# Patient Record
Sex: Female | Born: 1955
Health system: Southern US, Community
[De-identification: ages and names within clinical notes are randomized; demographics above are authoritative.]

## PROBLEM LIST (undated history)

## (undated) DIAGNOSIS — G4733 Obstructive sleep apnea (adult) (pediatric): Secondary | ICD-10-CM

## (undated) DIAGNOSIS — H269 Unspecified cataract: Secondary | ICD-10-CM

## (undated) DIAGNOSIS — K219 Gastro-esophageal reflux disease without esophagitis: Secondary | ICD-10-CM

## (undated) DIAGNOSIS — F32A Depression, unspecified: Secondary | ICD-10-CM

## (undated) DIAGNOSIS — Z780 Asymptomatic menopausal state: Secondary | ICD-10-CM

## (undated) DIAGNOSIS — F329 Major depressive disorder, single episode, unspecified: Secondary | ICD-10-CM

## (undated) DIAGNOSIS — Z8619 Personal history of other infectious and parasitic diseases: Secondary | ICD-10-CM

## (undated) DIAGNOSIS — E7800 Pure hypercholesterolemia, unspecified: Secondary | ICD-10-CM

## (undated) DIAGNOSIS — E871 Hypo-osmolality and hyponatremia: Secondary | ICD-10-CM

## (undated) DIAGNOSIS — G43909 Migraine, unspecified, not intractable, without status migrainosus: Secondary | ICD-10-CM

## (undated) DIAGNOSIS — F3181 Bipolar II disorder: Secondary | ICD-10-CM

## (undated) DIAGNOSIS — G510 Bell's palsy: Secondary | ICD-10-CM

## (undated) DIAGNOSIS — G4730 Sleep apnea, unspecified: Secondary | ICD-10-CM

## (undated) DIAGNOSIS — K559 Vascular disorder of intestine, unspecified: Secondary | ICD-10-CM

## (undated) DIAGNOSIS — Z9989 Dependence on other enabling machines and devices: Principal | ICD-10-CM

## (undated) DIAGNOSIS — K5190 Ulcerative colitis, unspecified, without complications: Secondary | ICD-10-CM

## (undated) DIAGNOSIS — M1990 Unspecified osteoarthritis, unspecified site: Secondary | ICD-10-CM

## (undated) DIAGNOSIS — F419 Anxiety disorder, unspecified: Secondary | ICD-10-CM

## (undated) DIAGNOSIS — H9192 Unspecified hearing loss, left ear: Secondary | ICD-10-CM

## (undated) DIAGNOSIS — E039 Hypothyroidism, unspecified: Secondary | ICD-10-CM

## (undated) DIAGNOSIS — D649 Anemia, unspecified: Principal | ICD-10-CM

## (undated) DIAGNOSIS — T7840XA Allergy, unspecified, initial encounter: Secondary | ICD-10-CM

## (undated) HISTORY — DX: Pure hypercholesterolemia, unspecified: E78.00

## (undated) HISTORY — DX: Sleep apnea, unspecified: G47.30

## (undated) HISTORY — DX: Bell's palsy: G51.0

## (undated) HISTORY — DX: Personal history of other infectious and parasitic diseases: Z86.19

## (undated) HISTORY — DX: Major depressive disorder, single episode, unspecified: F32.9

## (undated) HISTORY — PX: NASAL SEPTUM SURGERY: SHX37

## (undated) HISTORY — DX: Depression, unspecified: F32.A

## (undated) HISTORY — DX: Unspecified cataract: H26.9

## (undated) HISTORY — DX: Migraine, unspecified, not intractable, without status migrainosus: G43.909

## (undated) HISTORY — DX: Ulcerative colitis, unspecified, without complications: K51.90

## (undated) HISTORY — DX: Gastro-esophageal reflux disease without esophagitis: K21.9

## (undated) HISTORY — DX: Obstructive sleep apnea (adult) (pediatric): G47.33

## (undated) HISTORY — DX: Vascular disorder of intestine, unspecified: K55.9

## (undated) HISTORY — DX: Unspecified hearing loss, left ear: H91.92

## (undated) HISTORY — DX: Hypothyroidism, unspecified: E03.9

## (undated) HISTORY — DX: Anxiety disorder, unspecified: F41.9

## (undated) HISTORY — DX: Asymptomatic menopausal state: Z78.0

## (undated) HISTORY — DX: Dependence on other enabling machines and devices: Z99.89

## (undated) HISTORY — DX: Allergy, unspecified, initial encounter: T78.40XA

## (undated) HISTORY — DX: Hypo-osmolality and hyponatremia: E87.1

## (undated) HISTORY — DX: Bipolar II disorder: F31.81

## (undated) HISTORY — DX: Unspecified osteoarthritis, unspecified site: M19.90

## (undated) HISTORY — PX: EYE SURGERY: SHX253

## (undated) HISTORY — DX: Anemia, unspecified: D64.9

---

## 1998-11-17 ENCOUNTER — Emergency Department (HOSPITAL_COMMUNITY): Admission: EM | Admit: 1998-11-17 | Discharge: 1998-11-17 | Payer: Self-pay | Admitting: Emergency Medicine

## 1999-03-02 ENCOUNTER — Encounter: Admission: RE | Admit: 1999-03-02 | Discharge: 1999-03-02 | Payer: Self-pay | Admitting: Obstetrics and Gynecology

## 1999-03-03 ENCOUNTER — Encounter: Payer: Self-pay | Admitting: Obstetrics and Gynecology

## 1999-05-20 ENCOUNTER — Other Ambulatory Visit: Admission: RE | Admit: 1999-05-20 | Discharge: 1999-05-20 | Payer: Self-pay | Admitting: Obstetrics and Gynecology

## 2000-04-11 ENCOUNTER — Encounter: Payer: Self-pay | Admitting: Otolaryngology

## 2000-04-11 ENCOUNTER — Encounter: Admission: RE | Admit: 2000-04-11 | Discharge: 2000-04-11 | Payer: Self-pay | Admitting: Otolaryngology

## 2000-06-09 ENCOUNTER — Other Ambulatory Visit: Admission: RE | Admit: 2000-06-09 | Discharge: 2000-06-09 | Payer: Self-pay | Admitting: *Deleted

## 2001-06-06 ENCOUNTER — Other Ambulatory Visit: Admission: RE | Admit: 2001-06-06 | Discharge: 2001-06-06 | Payer: Self-pay | Admitting: Obstetrics and Gynecology

## 2001-06-13 ENCOUNTER — Encounter: Admission: RE | Admit: 2001-06-13 | Discharge: 2001-06-13 | Payer: Self-pay | Admitting: Obstetrics and Gynecology

## 2001-06-13 ENCOUNTER — Encounter: Payer: Self-pay | Admitting: Obstetrics and Gynecology

## 2002-07-31 ENCOUNTER — Other Ambulatory Visit: Admission: RE | Admit: 2002-07-31 | Discharge: 2002-07-31 | Payer: Self-pay | Admitting: Obstetrics and Gynecology

## 2003-05-06 ENCOUNTER — Encounter: Admission: RE | Admit: 2003-05-06 | Discharge: 2003-05-06 | Payer: Self-pay | Admitting: Obstetrics and Gynecology

## 2003-09-19 ENCOUNTER — Other Ambulatory Visit: Admission: RE | Admit: 2003-09-19 | Discharge: 2003-09-19 | Payer: Self-pay | Admitting: Obstetrics and Gynecology

## 2003-10-17 ENCOUNTER — Ambulatory Visit (HOSPITAL_COMMUNITY): Admission: RE | Admit: 2003-10-17 | Discharge: 2003-10-17 | Payer: Self-pay | Admitting: Gastroenterology

## 2003-10-17 ENCOUNTER — Encounter (INDEPENDENT_AMBULATORY_CARE_PROVIDER_SITE_OTHER): Payer: Self-pay | Admitting: Specialist

## 2004-03-11 ENCOUNTER — Encounter: Admission: RE | Admit: 2004-03-11 | Discharge: 2004-03-11 | Payer: Self-pay | Admitting: Family Medicine

## 2004-09-25 ENCOUNTER — Ambulatory Visit: Payer: Self-pay | Admitting: Gastroenterology

## 2004-09-25 ENCOUNTER — Inpatient Hospital Stay (HOSPITAL_COMMUNITY): Admission: EM | Admit: 2004-09-25 | Discharge: 2004-09-26 | Payer: Self-pay | Admitting: Emergency Medicine

## 2004-09-28 ENCOUNTER — Encounter: Admission: RE | Admit: 2004-09-28 | Discharge: 2004-09-28 | Payer: Self-pay | Admitting: Gastroenterology

## 2004-10-01 ENCOUNTER — Ambulatory Visit (HOSPITAL_COMMUNITY): Admission: RE | Admit: 2004-10-01 | Discharge: 2004-10-01 | Payer: Self-pay | Admitting: Gastroenterology

## 2004-10-01 ENCOUNTER — Encounter (INDEPENDENT_AMBULATORY_CARE_PROVIDER_SITE_OTHER): Payer: Self-pay | Admitting: Specialist

## 2004-10-13 ENCOUNTER — Other Ambulatory Visit: Admission: RE | Admit: 2004-10-13 | Discharge: 2004-10-13 | Payer: Self-pay | Admitting: Obstetrics and Gynecology

## 2004-11-08 ENCOUNTER — Encounter: Admission: RE | Admit: 2004-11-08 | Discharge: 2004-11-08 | Payer: Self-pay | Admitting: Obstetrics and Gynecology

## 2005-07-06 ENCOUNTER — Encounter (INDEPENDENT_AMBULATORY_CARE_PROVIDER_SITE_OTHER): Payer: Self-pay | Admitting: *Deleted

## 2005-07-06 ENCOUNTER — Ambulatory Visit (HOSPITAL_COMMUNITY): Admission: RE | Admit: 2005-07-06 | Discharge: 2005-07-06 | Payer: Self-pay | Admitting: Gastroenterology

## 2005-07-25 ENCOUNTER — Ambulatory Visit (HOSPITAL_COMMUNITY): Admission: RE | Admit: 2005-07-25 | Discharge: 2005-07-25 | Payer: Self-pay | Admitting: Gastroenterology

## 2005-11-10 ENCOUNTER — Encounter: Admission: RE | Admit: 2005-11-10 | Discharge: 2005-11-10 | Payer: Self-pay | Admitting: Obstetrics and Gynecology

## 2006-01-16 ENCOUNTER — Other Ambulatory Visit: Admission: RE | Admit: 2006-01-16 | Discharge: 2006-01-16 | Payer: Self-pay | Admitting: Obstetrics and Gynecology

## 2006-11-13 ENCOUNTER — Encounter: Admission: RE | Admit: 2006-11-13 | Discharge: 2006-11-13 | Payer: Self-pay | Admitting: Obstetrics and Gynecology

## 2007-01-19 ENCOUNTER — Other Ambulatory Visit: Admission: RE | Admit: 2007-01-19 | Discharge: 2007-01-19 | Payer: Self-pay | Admitting: Obstetrics and Gynecology

## 2007-11-26 ENCOUNTER — Encounter: Admission: RE | Admit: 2007-11-26 | Discharge: 2007-11-26 | Payer: Self-pay | Admitting: Obstetrics and Gynecology

## 2008-02-01 DIAGNOSIS — K559 Vascular disorder of intestine, unspecified: Secondary | ICD-10-CM

## 2008-02-01 HISTORY — DX: Vascular disorder of intestine, unspecified: K55.9

## 2008-02-08 ENCOUNTER — Other Ambulatory Visit: Admission: RE | Admit: 2008-02-08 | Discharge: 2008-02-08 | Payer: Self-pay | Admitting: Obstetrics and Gynecology

## 2008-11-26 ENCOUNTER — Encounter: Admission: RE | Admit: 2008-11-26 | Discharge: 2008-11-26 | Payer: Self-pay | Admitting: Obstetrics and Gynecology

## 2009-02-09 ENCOUNTER — Encounter: Admission: RE | Admit: 2009-02-09 | Discharge: 2009-02-09 | Payer: Self-pay | Admitting: Gastroenterology

## 2009-02-16 LAB — HM PAP SMEAR: HM Pap smear: NEGATIVE

## 2009-12-15 ENCOUNTER — Encounter: Admission: RE | Admit: 2009-12-15 | Discharge: 2009-12-15 | Payer: Self-pay | Admitting: Obstetrics and Gynecology

## 2010-02-21 ENCOUNTER — Encounter: Payer: Self-pay | Admitting: Gastroenterology

## 2010-06-18 NOTE — Discharge Summary (Signed)
NAMESHAINNA, FAUX NO.:  [id number]   MEDICAL RECORD NO.:  [id number]          PATIENT TYPE:  INP   LOCATION:  2033                         FACILITY:  [facility]   PHYSICIAN:  Jerelene Redden, MD      DATE OF BIRTH:  Apr 20, 1955   DATE OF ADMISSION:  09/24/2004  DATE OF DISCHARGE:  09/26/2004                                 DISCHARGE SUMMARY   Jessica Berger is a 55 year old lady who had last had a colonoscopy in  September 2005 by Dr. Collene Mares. I think the indication for this procedure was  that the patient's father had died as a result of colon cancer. This study  showed evidence of a left colon AVM and a hyperplastic polyp. The patient  presented to [facility] on September 25, 2004 with acute onset of  lower abdominal pain associated with several episodes of hematochezia. A CT  scan of the abdomen showed segmental thickening of the descending colon with  associated stranding. In light of these findings, the patient was admitted  to the hospital by Dr. Sheila Oats.   Her physical exam was remarkable for the abdominal exam being benign. There  were normal bowel sounds. There was no guarding or rebound. I mentioned the  patient was seen in consultation by Dr. Fuller Plan of the gastroenterology  service. Dr. Fuller Plan reviewed the patient's x-ray studies and felt that the  patient did in fact have evidence of descending colon colitis, probably an  ischemic process. On admission, the patient was initially made n.p.o. and  was placed empirically on Cipro and Flagyl before on a dose of 400  milligrams IV every 12 hours, the latter 500 mg IV every 8 hours. Dilaudid  was administered as needed for pain.   The next day, the patient was advanced to a clear liquid diet and tolerated  this well. During the course of hospitalization, she had no further problems  with gastrointestinal bleeding or abdominal pain. On day 27, the patient was  seen again by Dr. Fuller Plan. Dr. Fuller Plan  apparently reviewed the patient's CT scan  again and noted that there was a 7 mL hypodense area within the body of the  pancreas thought to be a pancreatic duct. This finding could possibly  indicate the presence of a neoplasm. Dr. Fuller Plan made the following  recommendations:  1) that the patient was to really balance her diet at home  following a lactose-free, caffeine-free, low-fat diet; 2) that the patient  could be discharged at this time and follow up with Dr. Collene Mares in one to two  weeks; 3) that the findings on the CT scan of the abdomen may be discussed  at the weekly GI conference and recommendations would be made whether to  proceed with endoscopic ultrasound or MRCP. Dr. Fuller Plan deferred this ultimate  decision to Dr. Collene Mares. Therefore, on September 26, 2004 at Dr. Lynne Leader  recommendation, the patient was discharged.   DISCHARGE DIAGNOSES:  1.  Abdominal pain and hematochezia secondary to colonic inflammation,      possibly on an ischemic basis.  2.  Hypokalemia.  3.  Abnormal lesion in the body of the pancreas. Subsequent workup to be      planned as described above.  4.  History of depression.  5.  History of carpal tunnel syndrome.  6.  History of restless leg syndrome.   DISCHARGE MEDICATIONS:  The patient is advised to continue all of her usual  medications. These consist of Wellbutrin 300 milligrams daily, Zoloft 200  milligrams daily, Requip 1 milligrams daily, calcium 1 gram daily, vitamin E  400 milligrams daily.   Antibiotics were discontinued and she was advised as noted above to follow a  lactose-free, caffeine-free, low-fat diet. Also advised above, she was told  to return to see Dr. Collene Mares in one to two weeks.           ______________________________  Jerelene Redden, MD     SY/MEDQ  D:  09/26/2004  T:  09/26/2004  Job:  [id number]   cc:   Frann Rider, M.D.  [place]  [place] [address]  Fax: [phone]   Nelwyn Salisbury, M.D.  [address]  [address]  [place] [address]  Fax: [phone]   Pricilla Riffle. Fuller Plan, M.D. [facility]

## 2010-06-18 NOTE — Op Note (Signed)
NAME:  SAMIHA, DENAPOLI               ACCOUNT NO.:  [id number]   MEDICAL RECORD NO.:  [id number]          PATIENT TYPE:  AMB   LOCATION:  ENDO                         FACILITY:  [facility]   PHYSICIAN:  Nelwyn Salisbury, M.D.  DATE OF BIRTH:  04-Apr-1955   DATE OF PROCEDURE:  10/01/2004  DATE OF DISCHARGE:                                 OPERATIVE REPORT   PROCEDURE PERFORMED:  Colonoscopy with multiple core biopsies.   ENDOSCOPIST:  Nelwyn Salisbury, M.D.   INSTRUMENT USED:  Olympus videocolonoscope.   INDICATIONS FOR PROCEDURE:  A 55 year old white female with family history  of colon cancer in her father undergoing repeat colonoscopy for significant  rectal bleeding requiring hospitalization in the recent past, and CT scan  revealing ischemic colitis.  Colonoscopy is being performed to define the  source of bleeding.   PRE PROCEDURE PREPARATION:  Informed consent was procured from the patient.  The patient was fasted for eight hours prior to procedure and prepped with  Osmoprep pills prior to the procedure (the night before and the morning of  the procedure).  Risks and benefits of the procedure including a 10% risk  rate of cancer and polyps were discussed with the patient.   PREPROCEDURE PHYSICAL:  VITAL SIGNS:  The patient has stable vital signs.  NECK:  Supple.  CHEST:  Clear to auscultation.  CARDIAC:  S1, S2 regular.  ABDOMEN:  Soft with normal bowel sounds.   DESCRIPTION OF PROCEDURE:  The patient was placed in the left lateral  decubitus position and sedated with 70 mg of Demerol and 7.5 mg of Versed in  slow incremental doses.  Once the patient was adequately sedated and  maintained on low-flow oxygen and continuous cardiac monitoring, the Olympus  videocolonoscope was advanced from the rectum to the cecum.  The  appendicular orifice and ileocecal valve were clearly visualized and  photographed.  Three small sessile polyps were biopsied from the  rectosigmoid colon, and  whitish exudates were noted in the rectum which were  biopsied for pathology as well to rule out C. difficile infection.  Retroflexion in the rectum revealed no abnormalities.  The patient tolerated  the procedure well without complication.  There was some loss of vascular  pattern in the left colon, but no definite ulceration or ischemia was noted.   IMPRESSION:  1.  Whitish exudates in rectum.  Question C. difficile colitis.  2.  Three small sessile polyps biopsied from rectosigmoid colon.  3.  Some loss of vascular pattern in left colon.  Question recent ischemic      insult.  4.  No ulcers or masses seen.  5.  Normal appearing transverse colon, right colon, and cecum.   RECOMMENDATIONS:  1.  Await pathology results.  2.  Avoid nonsteroidals including aspirin for now.  3.  Repeat colonoscopy depending on pathology result.  4.  Outpatient followup as need arises in the future.      Nelwyn Salisbury, M.D.  Electronically Signed     JNM/MEDQ  D:  10/01/2004  T:  10/01/2004  Job:  [id number]  cc:   Frann Rider, M.D.  [place]  [place] [address]  Fax: [place]. Romine, M.D.  [address] [place]. [place]  [place] [address]  Fax: [phone]

## 2010-06-18 NOTE — Op Note (Signed)
NAME:  Jessica Berger, Jessica Berger                         ACCOUNT NO.:  [id number]   MEDICAL RECORD NO.:  [id number]                   PATIENT TYPE:  AMB   LOCATION:  ENDO                                 FACILITY:  [facility]   PHYSICIAN:  Nelwyn Salisbury, M.D.               DATE OF BIRTH:  January 08, 1956   DATE OF PROCEDURE:  DATE OF DISCHARGE:                                 OPERATIVE REPORT   PROCEDURE PERFORMED:  Colonoscopy with snare, polypectomy x 1.   ENDOSCOPIST:  Nelwyn Salisbury, M.D.   INSTRUMENT USED:  Olympus video colonoscope.   INDICATIONS FOR THE PROCEDURE:  A 55 year old white female undergoing a  screening colonoscopy.  The patient has a family history of colon cancer in  the father and a paternal grandfather and has had recent rectal bleeding  with a change in bowel habits, rule out colonic polyps, masses, etc.   PREPROCEDURE PREPARATION:  Informed consent was secured from the patient.  The patient fasted for eight hours prior to the procedure and prepped with a  bottle of magnesium citrate and gallon of GoLYTELY the night prior to the  procedure.   PREPROCEDURE PHYSICAL:  The patient had stable vital signs.  Neck supple.  Chest is clear to auscultation.  S1, S2 regular.  Abdomen soft with normal  bowel sounds.   DESCRIPTION OF PROCEDURE:  The patient was placed in the left lateral  decubitus position, sedated with 75 mg of Demerol and 7.5 mg of Versed in  slow incremental doses.  Once the patient was adequately sedated and  maintained on low flow oxygen and continuous cardiac monitoring, the Olympus  video colonoscope was advanced from the rectum to the cecum.  A small AVM  was noted in the mid left colon.  This was not ablated as it was not  bleeding.  Small internal hemorrhoids were appreciated on retroflexion.  In  the rectum a flat polyp was snared from 30 cm.  The rest of the exam was  normal.  The appendiceal orifice and ileocecal valve were clearly visualized  and  photographed.   IMPRESSION:  1.  Small nonbleeding internal hemorrhoids.  2.  Flat polyp snared from 30 cm.  3.  Arteriovenous malformation in the mid left colon, not ablated.  4.  Normal appearing transverse colon, right colon and cecum.   RECOMMENDATIONS:  1.  Await pathology results.  2.  Continue a high fiber diet with fluid intake.  3.  Avoid nonsteroidals for now.  4.  Outpatient follow up in the next two weeks for further recommendations.      JNM/MEDQ  D:  10/18/2003  T:  10/20/2003  Job:  [id number]   cc:   Frann Rider, M.D.  [place]  [place] [address]  Fax: [phone]   Lubertha South. Romine, M.D.  [address], [address] [place]  [place] [address]  Fax:  [phone] 

## 2010-06-18 NOTE — H&P (Signed)
NAME:  Jessica Berger, Jessica Berger               ACCOUNT NO.:  [id number]   MEDICAL RECORD NO.:  [id number]          PATIENT TYPE:  EMS   LOCATION:  MAJO                         FACILITY:  [facility]   PHYSICIAN:  Sheila Oats, M.D.DATE OF BIRTH:  04-06-1955   DATE OF ADMISSION:  09/24/2004  DATE OF DISCHARGE:                                HISTORY & PHYSICAL   CHIEF COMPLAINT:  Abdominal pain and rectal bleeding.   PRIMARY CARE PHYSICIAN:  Dr. Darcus Austin.   HISTORY OF PRESENT ILLNESS:  The patient is a 55 year old white female with  history of depression and restless leg syndrome who presents with complaints  of abdominal pain and rectal bleeding times one day. She describes the pain  as a right-sided, severe, 10/10 in intensity at its peak, sharp and constant  times one day. She reports blood per rectum times five today as well. She  admits to nausea and vomiting times a few days. She denies chest pain,  hematemesis, dysuria, or melena. The patient also denies any recent  antibiotic use and denies any NSAID use.  It was noted that the patient last  saw Dr. Collene Mares in September 2005 and had a screening colonoscopy done at that  time which showed AV malformation and internal hemorrhoids as well as a  polyp.   The patient was seen in the emergency room and a CT scan of the abdomen  revealed a 15-cm length of thick wall descending colon and pericolonic  shunting with no abscess and no perforation. His H&H were stable at 12.1 and  36.3, and blood pressure of 108/51. In the ER the patient was also given IV  narcotics for pain relief and at that time she was seen she described the  pain as a dull ache, that it was much improved from when she came in. She is  admitted to the [facility] hospital service for further evaluation and  management.   PAST MEDICAL HISTORY:  As stated above.   MEDICATIONS:  1.  Zoloft 200 mg p.o. daily.  2.  Wellbutrin 300 mg p.o. daily.  3.  Requip.   ALLERGIES:  PENICILLIN  and SULFA.   SOCIAL HISTORY:  She quit tobacco 13 to 14 years ago.  Occasional alcohol.   FAMILY HISTORY:  Her father is deceased, diagnosed with colon cancer at age  79. Grandfather also had colon cancer. Her mother and father had  hypertension.   REVIEW OF SYSTEMS:  As per HPI.  Other review of systems negative.   PHYSICAL EXAMINATION:  GENERAL: The patient is a middle-age white female, in  no apparent distress.  VITAL SIGNS: Temperature 97.6, blood pressure 108/51, initially 145/87.  Pulse is 66, respiratory rate 20.  HEENT: PERRL. EOMI. Sclera anicteric. Slightly dry mucous membranes. No oral  exudates.  NECK: Supple. No adenopathy. No thyromegaly. No JVD.  LUNGS: Clear to auscultation bilaterally. No crackles or wheezes.  ABDOMEN: Soft, bowel sounds present, mild diffuse tenderness, no rebound  tenderness, no masses palpable, and no organomegaly.  EXTREMITIES: No cyanosis or edema.  NEUROLOGIC: Alert and oriented times three. Cranial nerves II-XII  grossly  intact. Nonfocal exam.   LABORATORY DATA:  CT scan of the abdomen--a 15-cm length of thick wall  descending colon with pericolonic stranding. No abscess or perforation.   Her white blood cell count is 6.3, hemoglobin 12.1, hematocrit 36.3,  platelet count 254,000, and her urinalysis is unremarkable. Her sodium is  138 with a potassium of 3.4, chloride 106, CO2 26, glucose 96, BUN 9,  creatinine 1, calcium 9.3, total protein 6.4, albumin 4.1, AST 14, ALT 12,  alkaline phosphatase 72, total bilirubin 0.5.   ASSESSMENT/PLAN:  1.  Rectal bleeding/colitis--the patient is a 55 year old white female      presenting with abdominal pain and rectal bleeding. CT scan with      thickening and pericolonic stranding of the descending colon--no abscess      and no perforation. H&H as above and patient hemodynamically stable.      The patient had a colonoscopy in 2006 and the findings are as stated      above, per Dr. Collene Mares. I will start  the patient on empiric antibiotics--      Cipro and Flagyl. Will keep her NPO for bowel rest and hydrate with IV      fluids. Will monitor H&H, type and screen. Consult gastroenterology--Dr.      Collene Mares.  2.  Hypokalemia, mild--replace potassium.  3.  History of depression--follow and resume Zoloft and Wellbutrin when      taking p.o.  4.  History of restless leg syndrome. Follow and resume Requip when taking      p.o.           ______________________________  Sheila Oats, M.D.     ACV/MEDQ  D:  09/25/2004  T:  09/25/2004  Job:  [id number]   cc:   Nelwyn Salisbury, M.D.  [address]  [address] [place] [address]  Fax: [phone]   Frann Rider, M.D.  [place]  [place] [address]  Fax: [phone]

## 2010-06-18 NOTE — Op Note (Signed)
NAME:  Jessica Berger, Jessica Berger               ACCOUNT NO.:  [id number]   MEDICAL RECORD NO.:  [id number]          PATIENT TYPE:  AMB   LOCATION:  ENDO                         FACILITY:  [facility]   PHYSICIAN:  Nelwyn Salisbury, M.D.  DATE OF BIRTH:  [birth year]   DATE OF PROCEDURE:  07/06/2005  DATE OF DISCHARGE:                                 OPERATIVE REPORT   PROCEDURE PERFORMED:  Esophagogastroduodenoscopy with multiple gastric and  small bowel biopsies.   ENDOSCOPIST:  Nelwyn Salisbury, M.D.   INSTRUMENT USED:  Olympus video panendoscope.   INDICATIONS FOR PROCEDURE:  The patient is a 55 year old white female with a  history of iron deficiency anemia and ferritinous stool, undergoing  esophagogastroduodenoscopy to rule out peptic ulcer disease, esophagitis,  gastritis, AVMs, etc.   PREPROCEDURE PREPARATION:  Informed consent was procured from the patient.  The patient was fasted for four hours prior to the procedure.  The risks and  benefits of the procedure were discussed with her in great detail.   PREPROCEDURE PHYSICAL:  The patient had stable vital signs.  Neck supple,  chest clear to auscultation.  S1, S2 regular.  Abdomen soft with normal  bowel sounds.   DESCRIPTION OF PROCEDURE:  The patient was placed in the left lateral  decubitus position and sedated with 25 mcg of fentanyl and 5 mg of Versed in  slow incremental doses.  Once the patient was adequately sedated and  maintained on low-flow oxygen and continuous cardiac monitoring, the Olympus  video panendoscope was advanced through the mouth piece over the tongue into  the esophagus under direct vision.  The entire esophagus was widely patent  with no evidence of ring, stricture, masses, esophagitis or Barrett's  mucosa.  There was a hiatal hernia seen on high retroflexion.  Antral  gastritis was noted.  Biopsies were done to rule out presence of  Helicobacter pylori by pathology.  No ulcers, masses or polyps were seen.  On  further advancing the scope to the stomach, the proximal small bowel  appeared normal.  Small bowel biopsies were done to rule out sprue.  The  patient tolerated the procedure well without immediate complications.   IMPRESSION:  1.  Normal-appearing esophagus.  2.  Large hiatal hernia seen on retroflexion.  3.  Antral gastritis biopsy done to rule out Helicobacter pylori.  4.  Normal proximal small bowel biopsies done to rule out sprue.   RECOMMENDATIONS:  1.  Await pathology results.  2.  Avoid all nonsteroidals including aspirin for now.  3.  Outpatient followup in the next two weeks for further recommendations      and repeat CBC.      Nelwyn Salisbury, M.D.  Electronically Signed     JNM/MEDQ  D:  07/06/2005  T:  07/07/2005  Job:  [id number]

## 2010-11-11 ENCOUNTER — Other Ambulatory Visit: Payer: Self-pay | Admitting: Obstetrics and Gynecology

## 2010-11-11 DIAGNOSIS — Z1231 Encounter for screening mammogram for malignant neoplasm of breast: Secondary | ICD-10-CM

## 2010-12-21 ENCOUNTER — Ambulatory Visit
Admission: RE | Admit: 2010-12-21 | Discharge: 2010-12-21 | Disposition: A | Discharge status: Home / Self Care | Payer: BC Managed Care – PPO | Source: Ambulatory Visit | Attending: Obstetrics and Gynecology | Admitting: Obstetrics and Gynecology

## 2010-12-21 DIAGNOSIS — Z1231 Encounter for screening mammogram for malignant neoplasm of breast: Secondary | ICD-10-CM

## 2010-12-22 ENCOUNTER — Other Ambulatory Visit: Payer: Self-pay | Admitting: Obstetrics and Gynecology

## 2010-12-22 DIAGNOSIS — R928 Other abnormal and inconclusive findings on diagnostic imaging of breast: Secondary | ICD-10-CM

## 2011-01-14 ENCOUNTER — Ambulatory Visit
Admission: RE | Admit: 2011-01-14 | Discharge: 2011-01-14 | Disposition: A | Discharge status: Home / Self Care | Payer: BC Managed Care – PPO | Source: Ambulatory Visit | Attending: Obstetrics and Gynecology | Admitting: Obstetrics and Gynecology

## 2011-01-14 DIAGNOSIS — N6320 Unspecified lump in the left breast, unspecified quadrant: Secondary | ICD-10-CM

## 2011-01-14 DIAGNOSIS — R928 Other abnormal and inconclusive findings on diagnostic imaging of breast: Secondary | ICD-10-CM

## 2011-12-28 ENCOUNTER — Other Ambulatory Visit: Payer: Self-pay | Admitting: Obstetrics and Gynecology

## 2011-12-28 DIAGNOSIS — Z1231 Encounter for screening mammogram for malignant neoplasm of breast: Secondary | ICD-10-CM

## 2012-01-12 LAB — HM COLONOSCOPY

## 2012-02-01 DIAGNOSIS — E039 Hypothyroidism, unspecified: Secondary | ICD-10-CM

## 2012-02-01 DIAGNOSIS — K5190 Ulcerative colitis, unspecified, without complications: Secondary | ICD-10-CM

## 2012-02-01 HISTORY — DX: Hypothyroidism, unspecified: E03.9

## 2012-02-01 HISTORY — DX: Ulcerative colitis, unspecified, without complications: K51.90

## 2012-02-09 ENCOUNTER — Ambulatory Visit: Payer: BC Managed Care – PPO

## 2012-02-09 ENCOUNTER — Ambulatory Visit
Admission: RE | Admit: 2012-02-09 | Discharge: 2012-02-09 | Disposition: A | Discharge status: Home / Self Care | Payer: BC Managed Care – PPO | Source: Ambulatory Visit | Attending: Obstetrics and Gynecology | Admitting: Obstetrics and Gynecology

## 2012-02-09 DIAGNOSIS — Z1231 Encounter for screening mammogram for malignant neoplasm of breast: Secondary | ICD-10-CM

## 2012-02-09 LAB — HM MAMMOGRAPHY: HM Mammogram: NEGATIVE

## 2012-04-04 ENCOUNTER — Telehealth: Payer: Self-pay | Admitting: Oncology

## 2012-04-04 NOTE — Telephone Encounter (Signed)
S/W PT IN REF TO NP APPT. ON 04/18/12@1 :30 REFERRING DR MANN DX-IRON DEF.ANEMIA MAILED NP PACKET

## 2012-04-04 NOTE — Telephone Encounter (Signed)
C/D 04/04/12 for appt. 04/18/12

## 2012-04-17 ENCOUNTER — Other Ambulatory Visit: Payer: Self-pay | Admitting: Oncology

## 2012-04-17 DIAGNOSIS — D649 Anemia, unspecified: Secondary | ICD-10-CM

## 2012-04-18 ENCOUNTER — Other Ambulatory Visit (HOSPITAL_BASED_OUTPATIENT_CLINIC_OR_DEPARTMENT_OTHER): Payer: BC Managed Care – PPO | Admitting: Lab

## 2012-04-18 ENCOUNTER — Encounter: Payer: Self-pay | Admitting: Oncology

## 2012-04-18 ENCOUNTER — Telehealth: Payer: Self-pay | Admitting: Oncology

## 2012-04-18 ENCOUNTER — Ambulatory Visit (HOSPITAL_BASED_OUTPATIENT_CLINIC_OR_DEPARTMENT_OTHER): Payer: BC Managed Care – PPO | Admitting: Oncology

## 2012-04-18 ENCOUNTER — Ambulatory Visit: Payer: BC Managed Care – PPO

## 2012-04-18 VITALS — BP 121/74 | HR 81 | Temp 98.7°F | Resp 20 | Ht 67.5 in | Wt 210.1 lb

## 2012-04-18 DIAGNOSIS — D509 Iron deficiency anemia, unspecified: Secondary | ICD-10-CM

## 2012-04-18 DIAGNOSIS — D649 Anemia, unspecified: Secondary | ICD-10-CM

## 2012-04-18 HISTORY — DX: Anemia, unspecified: D64.9

## 2012-04-18 LAB — CBC WITH DIFFERENTIAL/PLATELET
BASO%: 0.9 % (ref 0.0–2.0)
LYMPH%: 19.9 % (ref 14.0–49.7)
MCH: 25.8 pg (ref 25.1–34.0)
MCHC: 32.6 g/dL (ref 31.5–36.0)
MCV: 79.1 fL — ABNORMAL LOW (ref 79.5–101.0)
MONO%: 12.3 % (ref 0.0–14.0)
Platelets: 265 10*3/uL (ref 145–400)
RBC: 4.85 10*6/uL (ref 3.70–5.45)

## 2012-04-18 LAB — COMPREHENSIVE METABOLIC PANEL (CC13)
ALT: 12 U/L (ref 0–55)
Alkaline Phosphatase: 78 U/L (ref 40–150)
Glucose: 93 mg/dl (ref 70–99)
Sodium: 138 mEq/L (ref 136–145)
Total Bilirubin: 0.38 mg/dL (ref 0.20–1.20)
Total Protein: 6.8 g/dL (ref 6.4–8.3)

## 2012-04-18 LAB — IRON AND TIBC
%SAT: 11 % — ABNORMAL LOW (ref 20–55)
Iron: 41 ug/dL — ABNORMAL LOW (ref 42–145)

## 2012-04-18 NOTE — Progress Notes (Signed)
Note dictated

## 2012-04-18 NOTE — Progress Notes (Signed)
Checked in new pt with no financial concerns. °

## 2012-04-18 NOTE — Telephone Encounter (Signed)
gve the pt her march appt for the iv infusion and June 2014 appt calendar

## 2012-04-18 NOTE — Progress Notes (Signed)
CC:   Frann Rider, M.D. Nelwyn Salisbury, MD, [facility]  REASON FOR CONSULTATION:  Iron deficiency.  HISTORY OF PRESENT ILLNESS:  Jessica Berger is a 57 year old woman currently of [place], originally from [place], [place].  She is currently living in [place] as mentioned and has done so since the late 1990s.  She is reasonably in good health.  She does have history of a depression as well as a history of colitis and gets routine followup from a gastrointestinal standpoint by Dr. Collene Mares.  Her last colonoscopy was done in December 2013.  She was noted on most recent laboratory data to have some microcytosis with an MCV of 77.5, lower limit of normal was 78.  Her hemoglobin was normal at 12.3.  She had normal chemistry.  Iron studies showed iron of 61 and her ferritin was 7, lower limit of normal was 10.  She has had problems with fatigue, tiredness, but did not report any hematochezia.  Did not report any melena.  Did not report any GYN bleeding.  She had not reported any chest pain, had not had any difficulty breathing.  Still performing most activities of daily living without any hindrance or decline.  She has tried an oral iron supplementation but she experienced severe diarrhea and was sent to me for evaluation for possible IV iron.  REVIEW OF SYSTEMS:  Did not report any headaches, blurry vision, double vision.  Did not report any motor or sensory neuropathy.  Did not report any alteration in mental status.  Did not report any psychiatric issues, depression.  Did not report any fever, chills, sweats.  Did not report any cough, hemoptysis, hematemesis.  No nausea, vomiting.  Did not report any abdominal pain.  No hematochezia.  No melena.  No genitourinary complaints.  Rest of review of systems unremarkable.  PAST MEDICAL HISTORY:  Significant for ulcerative colitis.  She has history of seasonal allergy, depression, migraines, hemorrhoids, hypothyroidism.  MEDICATION:  She  is on Synthroid, Zoloft, Prometrium, Nexium, Latuda, Lamictal and Klonopin.  ALLERGIES:  Allergic to penicillin and sulfa, causes hives.  FAMILY HISTORY:  Her father had colon cancer.  Mother had asthma and hypertension.  SOCIAL HISTORY:  She is married.  She has 1 daughter.  Denied any alcohol or tobacco use.  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  PHYSICAL EXAMINATION:  General:  An alert, awake, pleasant woman, appeared in no active distress.  Her blood pressure is 121/74, pulse 81, respirations 20, temperature is 98, weighs 210 pounds.  HEENT:  Head is normocephalic, atraumatic.  Pupils equal and round, reactive to light. Oral mucosa moist and pink.  Neck:  Supple without lymphadenopathy. Heart:  Regular rate and rhythm.  S1 and S2.  Lungs:  Clear to auscultation.  No rhonchi, wheeze, dullness to percussion.  Abdomen: Soft, nontender.  No hepatosplenomegaly.  Extremities:  No clubbing, cyanosis or edema.  Neurologic:  Intact motor, sensory and deep tendon reflexes.  LABORATORY DATA:  Hemoglobin of 12.5, white cell count 4.8, platelet count 265.  Her is MCV 79, which is low, the lower limit of normal is 79.5.  She had normal chemistry and liver function tests.  ASSESSMENT AND PLAN:  This is a 57 year old woman with am iron deficiency as evidenced by her low ferritin.  Her iron levels are actually normal at 61 and her hemoglobin is normal; however, I discussed with her that the fact that given her low  ferritin, her ability to absorb iron is definitely compromised.  It may be due to her colitis, may be due to intermittent possible bleeding from a GYN source.  It renders her at a risk of developing iron-deficiency anemia.  I talked to her about a different iron formulation, whether it is p.o. and IV. Risks and benefits of different approaches were discussed.  I have talked to her about Feraheme IV infusion.  I talked to her about  the complications that include infusion-related toxicity, arthralgias, myalgias, rarely anaphylaxis.  She is agreeable to proceed with that, and I will recheck her counts in about 3 months to make sure that we have fully restored her iron levels and if her iron levels are reasonable at that time, I will probably follow up in 6 months after that and probably 12 months after that if she continued retain iron presumably.  All her questions were answered today.    ______________________________ Jessica Berger, M.D. FNS/MEDQ  D:  04/18/2012  T:  04/18/2012  Job:  [id number]

## 2012-04-20 ENCOUNTER — Encounter: Payer: Self-pay | Admitting: Hematology

## 2012-04-24 ENCOUNTER — Other Ambulatory Visit: Payer: Self-pay | Admitting: Obstetrics and Gynecology

## 2012-04-24 ENCOUNTER — Encounter: Payer: Self-pay | Admitting: Obstetrics and Gynecology

## 2012-04-24 ENCOUNTER — Ambulatory Visit (INDEPENDENT_AMBULATORY_CARE_PROVIDER_SITE_OTHER): Payer: BC Managed Care – PPO | Admitting: Obstetrics and Gynecology

## 2012-04-24 ENCOUNTER — Other Ambulatory Visit: Payer: Self-pay | Admitting: *Deleted

## 2012-04-24 VITALS — BP 124/80 | Ht 66.08 in | Wt 210.0 lb

## 2012-04-24 DIAGNOSIS — Z01419 Encounter for gynecological examination (general) (routine) without abnormal findings: Secondary | ICD-10-CM

## 2012-04-24 DIAGNOSIS — D649 Anemia, unspecified: Secondary | ICD-10-CM

## 2012-04-24 DIAGNOSIS — Z0000 Encounter for general adult medical examination without abnormal findings: Secondary | ICD-10-CM

## 2012-04-24 LAB — POCT URINALYSIS DIPSTICK
Bilirubin, UA: NEGATIVE
Blood, UA: NEGATIVE
Glucose, UA: NEGATIVE
Leukocytes, UA: NEGATIVE
Nitrite, UA: NEGATIVE
Urobilinogen, UA: NEGATIVE

## 2012-04-24 MED ORDER — ESTRADIOL 0.05 MG/24HR TD PTTW: 1.0000 | MEDICATED_PATCH | TRANSDERMAL | Status: DC

## 2012-04-24 MED ORDER — PROGESTERONE MICRONIZED 100 MG PO CAPS: 100.0000 mg | ORAL_CAPSULE | Freq: Every day | ORAL | Status: DC

## 2012-04-24 NOTE — Progress Notes (Signed)
57 y.o. MarriedCaucasian Berger   G1P1 here for annual exam.  Had eval for PMB Mar 07 2012 with endo bx which was benign.  At that time, her minivelle patch had come off and she had "re-stuck" it back on.  It was thought that potentially that could have caused the episode of PMB that the patient experienced.  No further bleeding since then.  Getting IV iron for anemia this Thursday.  No source of bleeding found.  Pt reports hgb and hct are ok, but ferritin is very low.  Seeing Dr. Collene Mares and Dr. Alen Blew at the [facility] who is a hematologist.  Will see Dr. Inda Merlin in May for chol check and Tdap if needed.  Daughter dropped out of college and living with patient and working at [employer] and husband dx'd with prostate cancer and is now impotent.  Pt dx'd as bipolar.  Also new dx of hypothryroidism.    Patient's last menstrual period was 01/31/2002.          Sexually active: yes  The current method of family planning is none.    Exercising: }not exercising Last mammogram:  02/09/2012 normal Last pap smear:02/16/2009 neg History of abnormal pap: no Smoking:former smoker 19yr ago Alcohol: 10 drinks per weeK (alcohol) Last colonoscopy:01/12/12 (1) polyp ulcerative colitis  Repeat in three years. Last Bone Density: none  Last tetanus shot: lees than 141yrLast cholesterol check: appt 05/2012  Hgb:  GI              Urine:normal    Health Maintenance  Topic Date Due  . Influenza Vaccine  10/01/1956  . Pap Smear  06/19/1974  . Tetanus/tdap  06/19/1975  . Mammogram  02/08/2014  . Colonoscopy  01/11/2022    Family History  Problem Relation Age of Onset  . Cancer Father   . Hypertension Father   . Breast cancer Maternal Grandmother   . Heart failure Mother   . Osteoarthritis Mother   . Depression Mother   . Hypertension Mother   . Cancer Paternal Grandmother   . Cancer Paternal Grandfather   . Heart attack Paternal Grandfather     Patient Active Problem List  Diagnosis  . Anemia,  unspecified    Past Medical History  Diagnosis Date  . Anemia, unspecified 04/18/2012  . Anemia, unspecified 04/18/2012  . Menopause   . Depression   . Hypercholesteremia   . Migraine   . Ischemic colitis   . Ulcerative colitis 02/01/2012  . Hypothyroidism 02/01/2012    Past Surgical History  Procedure Laterality Date  . Nasal septum surgery      Allergies: Penicillins; Sulfa antibiotics; and Xylocaine  Current Outpatient Prescriptions  Medication Sig Dispense Refill  . clonazePAM (KLONOPIN) 1 MG tablet Take 1 mg by mouth daily.      . Marland Kitchenstradiol (VIVELLE-DOT) 0.05 MG/24HR Place 1 patch onto the skin once a week.      . lamoTRIgine (LAMICTAL) 150 MG tablet       . LATUDA 40 MG TABS Take 40 mg by mouth daily.      . Marland KitchenEXIUM 40 MG capsule       . progesterone (PROMETRIUM) 100 MG capsule       . sertraline (ZOLOFT) 50 MG tablet       . SYNTHROID 88 MCG tablet        No current facility-administered medications for this visit.    ROS: Pertinent items are noted in HPI.  Exam:  BP 124/80  Ht 5' 6.08" (1.678 m)  Wt 210 lb (95.255 kg)  BMI 33.83 kg/m2  LMP 01/31/2002   Wt Readings from Last 3 Encounters:  04/24/12 210 lb (95.255 kg)  04/18/12 210 lb 1.6 oz (95.301 kg)     Ht Readings from Last 3 Encounters:  04/24/12 5' 6.08" (1.678 m)  04/18/12 5' 7.5" (1.715 m)    General appearance: alert, cooperative and appears stated age Head: Normocephalic, without obvious abnormality, atraumatic Neck: no adenopathy, supple, symmetrical, trachea midline and thyroid not enlarged, symmetric, no tenderness/mass/nodules Lungs: clear to auscultation bilaterally Breasts: Inspection negative, No nipple retraction or dimpling, No nipple discharge or bleeding, No axillary or supraclavicular adenopathy, Normal to palpation without dominant masses Heart: regular rate and rhythm Abdomen: soft, non-tender; bowel sounds normal; no masses,  no organomegaly Extremities: extremities normal,  atraumatic, no cyanosis or edema Skin: Skin color, texture, turgor normal. No rashes or lesions Lymph nodes: Cervical, supraclavicular, and axillary nodes normal. No abnormal inguinal nodes palpated Neurologic: Grossly normal   Pelvic: External genitalia:  no lesions              Urethra:  normal appearing urethra with no masses, tenderness or lesions              Bartholins and Skenes: normal                 Vagina: normal appearing vagina with normal color and discharge, no lesions              Cervix: normal appearance              Pap taken: yes        Bimanual Exam:  Uterus:  uterus is normal size, shape, consistency and nontender                                      Adnexa: normal adnexa in size, nontender and no masses                                      Rectovaginal: Confirms                                      Anus:  normal sphincter tone, no lesions  A: Nl menopausal exam, on HRT       Bipolar depression      Hypothyroidism     P: mammogram pap smear counseled on breast self exam, mammography screening, use and side effects of HRT, menopause, adequate intake of calcium and vitamin D, diet and exercise return annually or prn     An After Visit Summary was printed and given to the patient.

## 2012-04-24 NOTE — Patient Instructions (Signed)

## 2012-04-25 ENCOUNTER — Other Ambulatory Visit: Payer: Self-pay | Admitting: Obstetrics and Gynecology

## 2012-04-25 MED ORDER — ESTRADIOL 0.05 MG/24HR TD PTTW: MEDICATED_PATCH | TRANSDERMAL | Status: DC

## 2012-04-25 MED ORDER — ESTRADIOL 0.05 MG/24HR TD PTTW: 1.0000 | MEDICATED_PATCH | TRANSDERMAL | Status: DC

## 2012-04-27 ENCOUNTER — Ambulatory Visit (HOSPITAL_BASED_OUTPATIENT_CLINIC_OR_DEPARTMENT_OTHER): Payer: BC Managed Care – PPO

## 2012-04-27 VITALS — BP 124/71 | HR 81 | Temp 97.7°F | Resp 16

## 2012-04-27 DIAGNOSIS — D649 Anemia, unspecified: Secondary | ICD-10-CM

## 2012-04-27 DIAGNOSIS — D509 Iron deficiency anemia, unspecified: Secondary | ICD-10-CM

## 2012-04-27 MED ORDER — SODIUM CHLORIDE 0.9 % IV SOLN
Freq: Once | INTRAVENOUS | Status: AC
  Administered 2012-04-27: 15:00:00 via INTRAVENOUS

## 2012-04-27 MED ORDER — SODIUM CHLORIDE 0.9 % IV SOLN
1020.0000 mg | Freq: Once | INTRAVENOUS | Status: AC
  Administered 2012-04-27: 1020 mg via INTRAVENOUS
  Filled 2012-04-27: qty 34

## 2012-04-27 NOTE — Patient Instructions (Addendum)
Ferumoxytol injection What is this medicine? FERUMOXYTOL is an iron complex. Iron is used to make healthy red blood cells, which carry oxygen and nutrients throughout the body. This medicine is used to treat iron deficiency anemia in people with chronic kidney disease. This medicine may be used for other purposes; ask your health care provider or pharmacist if you have questions. What should I tell my health care provider before I take this medicine? They need to know if you have any of these conditions: -anemia not caused by low iron levels -high levels of iron in the blood -magnetic resonance imaging (MRI) test scheduled -an unusual or allergic reaction to iron, other medicines, foods, dyes, or preservatives -pregnant or trying to get pregnant -breast-feeding How should I use this medicine? This medicine is for infusion into a vein. It is given by a health care professional in a hospital or clinic setting. Talk to your pediatrician regarding the use of this medicine in children. Special care may be needed. Overdosage: If you think you've taken too much of this medicine contact a poison control center or emergency room at once. Overdosage: If you think you have taken too much of this medicine contact a poison control center or emergency room at once. NOTE: This medicine is only for you. Do not share this medicine with others. What if I miss a dose? It is important not to miss your dose. Call your doctor or health care professional if you are unable to keep an appointment. What may interact with this medicine? This medicine may interact with the following medications: -other iron products This list may not describe all possible interactions. Give your health care provider a list of all the medicines, herbs, non-prescription drugs, or dietary supplements you use. Also tell them if you smoke, drink alcohol, or use illegal drugs. Some items may interact with your medicine. What should I watch  for while using this medicine? Visit your doctor or healthcare professional regularly. Tell your doctor or healthcare professional if your symptoms do not start to get better or if they get worse. You may need blood work done while you are taking this medicine. You may need to follow a special diet. Talk to your doctor. Foods that contain iron include: whole grains/cereals, dried fruits, beans, or peas, leafy green vegetables, and organ meats (liver, kidney). What side effects may I notice from receiving this medicine? Side effects that you should report to your doctor or health care professional as soon as possible: -allergic reactions like skin rash, itching or hives, swelling of the face, lips, or tongue -breathing problems -changes in blood pressure -feeling faint or lightheaded, falls -fever or chills -flushing, sweating, or hot feelings -swelling of the ankles or feet Side effects that usually do not require medical attention (Report these to your doctor or health care professional if they continue or are bothersome.): -diarrhea -headache -nausea, vomiting -stomach pain This list may not describe all possible side effects. Call your doctor for medical advice about side effects. You may report side effects to FDA at 1-800-FDA-1088. Where should I keep my medicine? This drug is given in a hospital or clinic and will not be stored at home. NOTE: This sheet is a summary. It may not cover all possible information. If you have questions about this medicine, talk to your doctor, pharmacist, or health care provider.  2013, Elsevier/Gold Standard. (10/10/2007 9:48:25 PM)

## 2012-05-03 ENCOUNTER — Encounter: Payer: Self-pay | Admitting: Oncology

## 2012-07-19 ENCOUNTER — Other Ambulatory Visit (HOSPITAL_BASED_OUTPATIENT_CLINIC_OR_DEPARTMENT_OTHER): Payer: BC Managed Care – PPO | Admitting: Lab

## 2012-07-19 ENCOUNTER — Encounter: Payer: Self-pay | Admitting: Oncology

## 2012-07-19 ENCOUNTER — Ambulatory Visit (HOSPITAL_BASED_OUTPATIENT_CLINIC_OR_DEPARTMENT_OTHER): Payer: BC Managed Care – PPO | Admitting: Oncology

## 2012-07-19 VITALS — BP 132/82 | HR 79 | Temp 97.4°F | Resp 18 | Ht 66.08 in | Wt 208.8 lb

## 2012-07-19 DIAGNOSIS — D649 Anemia, unspecified: Secondary | ICD-10-CM

## 2012-07-19 LAB — CBC WITH DIFFERENTIAL/PLATELET
Eosinophils Absolute: 0.1 10*3/uL (ref 0.0–0.5)
HCT: 46.2 % (ref 34.8–46.6)
LYMPH%: 22.8 % (ref 14.0–49.7)
MONO#: 0.5 10*3/uL (ref 0.1–0.9)
NEUT#: 3.5 10*3/uL (ref 1.5–6.5)
NEUT%: 65.3 % (ref 38.4–76.8)
Platelets: 254 10*3/uL (ref 145–400)
RBC: 4.99 10*6/uL (ref 3.70–5.45)
WBC: 5.3 10*3/uL (ref 3.9–10.3)
nRBC: 0 % (ref 0–0)

## 2012-07-19 LAB — IRON AND TIBC
Iron: 75 ug/dL (ref 42–145)
TIBC: 299 ug/dL (ref 250–470)
UIBC: 224 ug/dL (ref 125–400)

## 2012-07-19 NOTE — Progress Notes (Signed)
Hematology and Oncology Follow Up Visit  TRACY GERKEN [id number] [birth year] 57 y.o. 07/19/2012 2:08 PM Marjorie Smolder, MDGates, Inez Pilgrim, MD   Principle Diagnosis: Iron deficiency anemia  Prior Therapy: Feraheme 1020 mg IV on 04/27/2012  Current therapy: Watchful observation  Interim History: Mr. Guyett returns for routine followup by herself. She received IV iron about 3 months ago and tolerated well without infusion reaction. She had no arthralgias following the reaction. Her fatigue has improved. Denies chest pain, shortness of breath, dyspnea on exertion. No abdominal pain, nausea, vomiting. Denies epistaxis, hemoptysis, hematemesis, hematuria, hematochezia, and melena.  Medications: I have reviewed the patient's current medications.  Current Outpatient Prescriptions  Medication Sig Dispense Refill  . belladonna alk-PHENObarbital (DONNATAL) 16.2 MG tablet Take 1 tablet by mouth as needed.      . budesonide (ENTOCORT EC) 3 MG 24 hr capsule Take 6 mg by mouth every morning.      . loperamide (IMODIUM) 2 MG capsule Take 2 mg by mouth 4 (four) times daily as needed for diarrhea or loose stools.      . clonazePAM (KLONOPIN) 1 MG tablet Take 1 mg by mouth daily.      Marland Kitchen estradiol (MINIVELLE) 0.05 MG/24HR Apply to lower abdomen twice a week  24 patch  3  . fluticasone (FLONASE) 50 MCG/ACT nasal spray Place 2 sprays into the nose daily as needed.      . lamoTRIgine (LAMICTAL) 150 MG tablet       . LATUDA 40 MG TABS Take 40 mg by mouth daily.      Marland Kitchen NEXIUM 40 MG capsule       . progesterone (PROMETRIUM) 100 MG capsule Take 1 capsule (100 mg total) by mouth daily.  90 capsule  3  . sertraline (ZOLOFT) 50 MG tablet       . SYNTHROID 88 MCG tablet        No current facility-administered medications for this visit.     Allergies:  Allergies  Allergen Reactions  . Penicillins   . Sulfa Antibiotics   . Xylocaine (Lidocaine)     Doesn't numb    Past Medical History, Surgical  history, Social history, and Family History were reviewed and updated.  Review of Systems: Constitutional:  Negative for fever, chills, night sweats, anorexia, weight loss, pain. Cardiovascular: no chest pain or dyspnea on exertion Respiratory: no cough, shortness of breath, or wheezing Neurological: no TIA or stroke symptoms Dermatological: negative ENT: negative Skin: Negative. Gastrointestinal: no abdominal pain, change in bowel habits, or black or bloody stools Genito-Urinary: no dysuria, trouble voiding, or hematuria Hematological and Lymphatic: negative Breast: negative for breast lumps Musculoskeletal: negative Remaining ROS negative.  Physical Exam: Blood pressure 132/82, pulse 79, temperature 97.4 F (36.3 C), temperature source Oral, resp. rate 18, height 5' 6.08" (1.678 m), weight 208 lb 12.8 oz (94.711 kg), last menstrual period 01/31/2002. ECOG: 0 General appearance: alert, cooperative and no distress Head: Normocephalic, without obvious abnormality, atraumatic Neck: no adenopathy, no carotid bruit, no JVD, supple, symmetrical, trachea midline and thyroid not enlarged, symmetric, no tenderness/mass/nodules Lymph nodes: Cervical, supraclavicular, and axillary nodes normal. Heart:regular rate and rhythm, S1, S2 normal, no murmur, click, rub or gallop Lung:chest clear, no wheezing, rales, normal symmetric air entry, no tachypnea, retractions or cyanosis Abdomen: soft, non-tender, without masses or organomegaly EXT:no erythema, induration, or nodules   Lab Results: Lab Results  Component Value Date   WBC 5.3 07/19/2012   HGB 15.5 07/19/2012   HCT 46.2  07/19/2012   MCV 92.6 07/19/2012   PLT 254 07/19/2012     Chemistry      Component Value Date/Time   NA 138 04/18/2012 1346   K 4.0 04/18/2012 1346   CL 104 04/18/2012 1346   CO2 25 04/18/2012 1346   BUN 17.2 04/18/2012 1346   CREATININE 1.0 04/18/2012 1346      Component Value Date/Time   CALCIUM 9.1 04/18/2012 1346    ALKPHOS 78 04/18/2012 1346   AST 15 04/18/2012 1346   ALT 12 04/18/2012 1346   BILITOT 0.38 04/18/2012 1346      Impression and Plan: This is a 57 year old female the following issues: 1. Iron deficiency anemia. Status post Feraheme with improvement in her hemoglobin to 15.5. Iron studies are pending today. If her ferritin is low we can consider repeating her IV iron. Otherwise recommend continued observation. 2. Ulcerative colitis. She is followed by Dr. Collene Mares for this. 3. Followup. I have recommended followup in 6 months time, however, the patient states that she has routine lab work at Dr. Lorie Apley office and prefers to her labs there. I have scheduled her back only as needed. She will call us if her iron studies are low at Dr. Lorie Apley office.  Spent more than half the time coordinating care.    Mikey Bussing 6/19/20142:08 PM

## 2012-10-29 ENCOUNTER — Ambulatory Visit (INDEPENDENT_AMBULATORY_CARE_PROVIDER_SITE_OTHER): Payer: BC Managed Care – PPO | Admitting: Nurse Practitioner

## 2012-10-29 ENCOUNTER — Encounter: Payer: Self-pay | Admitting: Nurse Practitioner

## 2012-10-29 VITALS — BP 110/66 | HR 64 | Temp 98.7°F | Ht 67.0 in | Wt 214.0 lb

## 2012-10-29 DIAGNOSIS — T8130XA Disruption of wound, unspecified, initial encounter: Secondary | ICD-10-CM

## 2012-10-29 MED ORDER — ESTRADIOL 0.5 MG PO TABS: 0.5000 mg | ORAL_TABLET | Freq: Every day | ORAL | Status: DC

## 2012-10-29 NOTE — Patient Instructions (Addendum)
Will take oral estrogen for about a month then retry the patch.

## 2012-10-29 NOTE — Progress Notes (Signed)
Subjective:     Patient ID: Jessica Berger, female   DOB: [birth year], 56 y.o.   MRN: [id number]  Allergic Reaction   57 yo WM Fe presents with a history of application of a new Minivelle  Patch 0.05 mg on Saturday.  She now has a wound that was red and open that was present when patch was taken off.  The prior patch was removed on Tuesday of last week and it also had an open and sore wound but smaller.  She call the manufactor today and spoke with an agent who asked her to come here for evaluation. She denies any changes to personal products. No long travel that she may have worn a set belt.  No one in the household with a skin infection.  No other illness that would make her prone to infection.  Denies fever/ chills, nausea or vomiting. Denies being a diabetic.   Review of Systems  Constitutional: Negative for fever, chills, fatigue and unexpected weight change.  HENT: Negative.   Respiratory: Negative.   Cardiovascular: Negative.   Gastrointestinal: Negative.   Endocrine: Negative.   Genitourinary: Negative.  Negative for dysuria, urgency, hematuria, flank pain, decreased urine volume, vaginal bleeding, vaginal discharge, genital sores, vaginal pain and pelvic pain.  Musculoskeletal: Negative.   Skin: Negative.   Neurological: Negative.   Hematological: Negative.   Psychiatric/Behavioral: Negative.        Objective:   Physical Exam  Constitutional: She appears well-developed and well-nourished.  Abdominal: Soft. She exhibits no distension. There is no tenderness. There is no rebound.  Skin: Skin is warm. Rash noted. There is erythema.     On right lower abdomen there is a half crescent shaped moon lesions that is healing with first layer of epidermis peeled off.  On the left lower abdomen there is another half moon shaped for top and bottom.  Wound culture for MRSA is taken from the right abdominal wound.  Psychiatric: She has a normal mood and affect. Her behavior is normal.  Judgment and thought content normal.       Assessment:     Possible allergic reaction to Minivelle patch R/O MRSA    Plan:     Discussed all the possibilities of a reaction including the adhesive and Latex allergy. Discussed MRSA and to watch for further signs of a infection and call ASAP Will avooid use of Minivelle Patch at this time and she is given Rx for a month of Estradiol 0.05 mg po.  She then may try the patch again and see if reaction. Will follow with culture.

## 2012-10-30 NOTE — Progress Notes (Signed)
Reviewed personally.  M. Suzanne Thailan Sava, MD.  

## 2012-11-26 ENCOUNTER — Telehealth: Payer: Self-pay | Admitting: Nurse Practitioner

## 2012-11-26 NOTE — Telephone Encounter (Signed)
Jessica Berger is working well. Patient wants to continue taking pill instead of patch.  [facility] [phone]

## 2012-11-26 NOTE — Telephone Encounter (Signed)
Jessica Berger can you place order for estradiol?

## 2012-11-27 MED ORDER — ESTRADIOL 0.5 MG PO TABS: 0.5000 mg | ORAL_TABLET | Freq: Every day | ORAL | Status: DC

## 2012-11-30 NOTE — Telephone Encounter (Signed)
Message left to advise "The Prescription that you have requested was sent to your pharmacy if you have any questions,  return call to [facility] at [phone]."

## 2012-12-06 ENCOUNTER — Other Ambulatory Visit: Payer: Self-pay

## 2013-01-10 ENCOUNTER — Telehealth: Payer: Self-pay

## 2013-01-10 MED ORDER — ESTRADIOL 0.5 MG PO TABS: 0.5000 mg | ORAL_TABLET | Freq: Every day | ORAL | Status: DC

## 2013-01-10 NOTE — Telephone Encounter (Signed)
Patient has changed pharmacies to mail order. She has AEX scheduled for 04/26/13. Has had MMG 1/14. Rx for this sent until AEX//kn

## 2013-01-22 ENCOUNTER — Other Ambulatory Visit: Payer: Self-pay

## 2013-01-22 DIAGNOSIS — Z1231 Encounter for screening mammogram for malignant neoplasm of breast: Secondary | ICD-10-CM

## 2013-02-19 ENCOUNTER — Ambulatory Visit
Admission: RE | Admit: 2013-02-19 | Discharge: 2013-02-19 | Disposition: A | Discharge status: Home / Self Care | Payer: BC Managed Care – PPO | Source: Ambulatory Visit

## 2013-02-19 DIAGNOSIS — Z1231 Encounter for screening mammogram for malignant neoplasm of breast: Secondary | ICD-10-CM

## 2013-02-22 ENCOUNTER — Other Ambulatory Visit: Payer: Self-pay | Admitting: Obstetrics and Gynecology

## 2013-02-22 ENCOUNTER — Other Ambulatory Visit: Payer: Self-pay | Admitting: Cardiology

## 2013-02-22 DIAGNOSIS — R928 Other abnormal and inconclusive findings on diagnostic imaging of breast: Secondary | ICD-10-CM

## 2013-03-06 ENCOUNTER — Ambulatory Visit
Admission: RE | Admit: 2013-03-06 | Discharge: 2013-03-06 | Disposition: A | Discharge status: Home / Self Care | Payer: BC Managed Care – PPO | Source: Ambulatory Visit | Attending: Obstetrics and Gynecology | Admitting: Obstetrics and Gynecology

## 2013-03-06 DIAGNOSIS — R928 Other abnormal and inconclusive findings on diagnostic imaging of breast: Secondary | ICD-10-CM

## 2013-04-05 ENCOUNTER — Other Ambulatory Visit: Payer: Self-pay | Admitting: Obstetrics and Gynecology

## 2013-04-05 NOTE — Telephone Encounter (Signed)
Last AEX 04/24/12 Last refill 04/24/12 #90/3 refills Last MMG 02/22/2013 Next appt 04/26/2013.  Will refill once until appt 04/26/2013.

## 2013-04-26 ENCOUNTER — Ambulatory Visit (INDEPENDENT_AMBULATORY_CARE_PROVIDER_SITE_OTHER): Payer: BC Managed Care – PPO | Admitting: Gynecology

## 2013-04-26 ENCOUNTER — Encounter: Payer: Self-pay | Admitting: Gynecology

## 2013-04-26 ENCOUNTER — Ambulatory Visit: Payer: BC Managed Care – PPO | Admitting: Obstetrics and Gynecology

## 2013-04-26 VITALS — BP 114/72 | HR 66 | Resp 12 | Ht 68.25 in | Wt 222.0 lb

## 2013-04-26 DIAGNOSIS — F3189 Other bipolar disorder: Secondary | ICD-10-CM

## 2013-04-26 DIAGNOSIS — F3181 Bipolar II disorder: Secondary | ICD-10-CM | POA: Insufficient documentation

## 2013-04-26 DIAGNOSIS — K559 Vascular disorder of intestine, unspecified: Secondary | ICD-10-CM

## 2013-04-26 DIAGNOSIS — Z01419 Encounter for gynecological examination (general) (routine) without abnormal findings: Secondary | ICD-10-CM

## 2013-04-26 DIAGNOSIS — N951 Menopausal and female climacteric states: Secondary | ICD-10-CM

## 2013-04-26 DIAGNOSIS — K5190 Ulcerative colitis, unspecified, without complications: Secondary | ICD-10-CM

## 2013-04-26 MED ORDER — PROGESTERONE MICRONIZED 100 MG PO CAPS: ORAL_CAPSULE | ORAL | Status: DC

## 2013-04-26 MED ORDER — ESTRADIOL 0.5 MG PO TABS: 0.5000 mg | ORAL_TABLET | Freq: Every day | ORAL | Status: DC

## 2013-04-26 NOTE — Patient Instructions (Signed)

## 2013-04-26 NOTE — Progress Notes (Signed)
58 y.o. Married Caucasian female   G1P1 here for annual exam. Pt reports menses are absent due to Menopause.  She does report hot flashes, does have night sweats since d/c zoloft, does not have vaginal dryness.  She is not using lubricants. She does not report post-menopasual bleeding.  Pt was seen last year for PMB and was changed to oral estrogen and has had no problems since.  Patient's last menstrual period was 01/31/2002.          Sexually active: yes  The current method of family planning is status post hysterectomy and post menopausal status.    Exercising: yes  Last pap: 04/24/2012 Neg HR HPV Abnormal PAP: No Mammogram: 02/20/13 Bi-Rads 0 ;  03/11/13 Breast US Bi-Rads 1  BSE: No Colonoscopy: 2 years ago Polyps- Ulcerative Colitis DEXA: none  Alcohol:  10 drinks/wk   Tobacco: no  Health Maintenance  Topic Date Due  . Tetanus/tdap  06/19/1975  . Influenza Vaccine  08/31/2012  . Mammogram  03/07/2015  . Pap Smear  04/25/2015  . Colonoscopy  01/11/2022    Family History  Problem Relation Age of Onset  . Cancer Father   . Hypertension Father   . Breast cancer Maternal Grandmother   . Heart failure Mother   . Osteoarthritis Mother   . Depression Mother   . Hypertension Mother   . Cancer Paternal Grandmother   . Cancer Paternal Grandfather   . Heart attack Paternal Grandfather     Patient Active Problem List   Diagnosis Date Noted  . Bipolar 2 disorder   . Ischemic colitis   . Anemia, unspecified 04/18/2012  . Ulcerative colitis 02/01/2012    Past Medical History  Diagnosis Date  . Anemia, unspecified 04/18/2012  . Menopause   . Bipolar 2 disorder   . Hypercholesteremia   . Migraine   . Ischemic colitis   . Ulcerative colitis 02/01/2012  . Hypothyroidism 02/01/2012    Past Surgical History  Procedure Laterality Date  . Nasal septum surgery      Allergies: Penicillins; Sulfa antibiotics; and Xylocaine  Current Outpatient Prescriptions  Medication Sig  Dispense Refill  . budesonide (ENTOCORT EC) 3 MG 24 hr capsule Take 6 mg by mouth every morning.      . clonazePAM (KLONOPIN) 1 MG tablet Take 1 mg by mouth daily.      Marland Kitchen estradiol (ESTRACE) 0.5 MG tablet Take 1 tablet (0.5 mg total) by mouth daily.  90 tablet  1  . fluticasone (FLONASE) 50 MCG/ACT nasal spray Place 2 sprays into the nose daily as needed.      . lamoTRIgine (LAMICTAL) 200 MG tablet Take 200 mg by mouth daily.      Marland Kitchen levothyroxine (SYNTHROID, LEVOTHROID) 100 MCG tablet Take 100 mcg by mouth daily before breakfast.      . loperamide (IMODIUM) 2 MG capsule Take 2 mg by mouth 4 (four) times daily as needed for diarrhea or loose stools.      Marland Kitchen loratadine (CLARITIN) 10 MG tablet Take 10 mg by mouth daily.      . Lurasidone HCl (LATUDA) 60 MG TABS Take by mouth.      Marland Kitchen NEXIUM 40 MG capsule       . progesterone (PROMETRIUM) 100 MG capsule TAKE 1 CAPSULE DAILY  90 capsule  0  . belladonna alk-PHENObarbital (DONNATAL) 16.2 MG tablet Take 1 tablet by mouth as needed.      . sertraline (ZOLOFT) 50 MG tablet       .  Vitamin D, Ergocalciferol, (DRISDOL) 50000 UNITS CAPS capsule once a week.       No current facility-administered medications for this visit.    ROS: Pertinent items are noted in HPI.  Exam:    BP 114/72  Pulse 66  Resp 12  Ht 5' 8.25" (1.734 m)  Wt 222 lb (100.699 kg)  BMI 33.49 kg/m2  LMP 01/31/2002 Weight change: _0 @ Last 3 height recordings:  Ht Readings from Last 3 Encounters:  04/26/13 5' 8.25" (1.734 m)  10/29/12 5' 7" (1.702 m)  07/19/12 5' 6.08" (1.678 m)   General appearance: alert, cooperative and appears stated age Head: Normocephalic, without obvious abnormality, atraumatic Neck: no adenopathy, no carotid bruit, no JVD, supple, symmetrical, trachea midline and thyroid not enlarged, symmetric, no tenderness/mass/nodules Lungs: clear to auscultation bilaterally Breasts: normal appearance, no masses or tenderness Heart: regular rate and  rhythm, S1, S2 normal, no murmur, click, rub or gallop Abdomen: soft, non-tender; bowel sounds normal; no masses,  no organomegaly Extremities: extremities normal, atraumatic, no cyanosis or edema Skin: Skin color, texture, turgor normal. No rashes or lesions Lymph nodes: Cervical, supraclavicular, and axillary nodes normal. no inguinal nodes palpated Neurologic: Grossly normal   Pelvic: External genitalia:  no lesions              Urethra: normal appearing urethra with no masses, tenderness or lesions              Bartholins and Skenes: Bartholin's, Urethra, Skene's normal                 Vagina: normal appearing vagina with normal color and discharge, no lesions              Cervix: normal appearance              Pap taken: no        Bimanual Exam:  Uterus:  uterus is normal size, shape, consistency and nontender                                      Adnexa:    no masses                                      Rectovaginal: Confirms                                      Anus:  normal sphincter tone, no lesions  A: well woman no contraindication to continue hormonal therapy      P: mammogram counseled on breast self exam, mammography screening, menopause, adequate intake of calcium and vitamin D, diet and exercise We reviewed the recommendations of ACOG and WHI re HRT, risks and benefits reviewed, affects on bone, colon, cholesterol and menopausal symptoms discussed.  We also discussed non-hormonal options such as SSRI's, SSRI/SNRI's such as effexor and mechanism of action.  Questions addressed.  Pt chooses to continue her HRT at present and refills were sent in.   return annually or prn Discussed PAP guideline changes, importance of weight bearing exercises, calcium, vit D and balanced diet.  An After Visit Summary was printed and given to the patient.

## 2013-06-20 ENCOUNTER — Encounter: Payer: Self-pay | Admitting: Neurology

## 2013-06-20 ENCOUNTER — Ambulatory Visit (INDEPENDENT_AMBULATORY_CARE_PROVIDER_SITE_OTHER): Payer: BC Managed Care – PPO | Admitting: Neurology

## 2013-06-20 VITALS — BP 131/85 | HR 73 | Temp 97.9°F | Ht 68.0 in | Wt 225.0 lb

## 2013-06-20 DIAGNOSIS — G4733 Obstructive sleep apnea (adult) (pediatric): Secondary | ICD-10-CM

## 2013-06-20 DIAGNOSIS — R400 Somnolence: Secondary | ICD-10-CM

## 2013-06-20 DIAGNOSIS — E669 Obesity, unspecified: Secondary | ICD-10-CM

## 2013-06-20 DIAGNOSIS — G4761 Periodic limb movement disorder: Secondary | ICD-10-CM

## 2013-06-20 DIAGNOSIS — G4710 Hypersomnia, unspecified: Secondary | ICD-10-CM

## 2013-06-20 DIAGNOSIS — R51 Headache: Secondary | ICD-10-CM

## 2013-06-20 DIAGNOSIS — R519 Headache, unspecified: Secondary | ICD-10-CM

## 2013-06-20 NOTE — Progress Notes (Signed)
Subjective:    Patient ID: Jessica Berger is a 58 y.o. female.  HPI    Star Age, MD, PhD [facility] [address]O. [place], [place] [address]  Dear Sim Boast,   I saw your patient, Jessica Berger, upon your kind request in my neurologic clinic today for initial consultation of her sleep disorder, in particular, concern for obstructive sleep apnea. The patient is unaccompanied today. As you know, Jessica Berger is a 58 year old right-handed woman with an underlying medical history of ulcerative colitis, obesity and bipolar disorder, who reports excessive daytime somnolence, snoring and recurrent headaches, but no morning HAs. She used to have migraines, but not recently. She has twitching in her legs and had PLMs during a sleep study in 2006, but had no OSA. She has been on clonazepam 1 mg qHS, which has helped the kicking. She gained weight since then, about 15-17 lbs. She snores, and it wakes her up. She has woken up with a sense of gasping. She drinks 2 mixed drinks with alcohol per night.  Her typical bedtime is reported to be around 1 AM and usual wake time is around 10 to 11 AM. Sleep onset typically occurs within a few minutes. She reports feeling adequately rested upon awakening. She wakes up on an average 2 to 3 times in the middle of the night and has to go to the bathroom 1 to 2 times on a typical night.  She reports excessive daytime somnolence (EDS) and Her Epworth Sleepiness Score (ESS) is 11/24 today. She has not fallen asleep while driving. The patient has been taking a planned nap, which is usually 2 hours long, after lunch. There is some report of nighttime reflux, with rare nighttime cough experienced. She is known to kick while asleep or before falling asleep. There is family history of OSA in her mother.  She is a restless sleeper and in the morning, the bed is quite disheveled.   She denies cataplexy, sleep paralysis, hypnagogic or  hypnopompic hallucinations, or sleep attacks. She does not report any nightmares, dream enactments, or parasomnias, such as sleep talking or sleep walking. She consumes 2 to 3 caffeinated beverages per day, usually in the form of coffee and Oolong tea.   Her bedroom is usually dark and cool. There is a TV in the bedroom and usually it is not on at night.   Her Past Medical History Is Significant For: Past Medical History  Diagnosis Date  . Anemia, unspecified 04/18/2012  . Menopause   . Bipolar 2 disorder   . Hypercholesteremia   . Migraine   . Ischemic colitis   . Ulcerative colitis 02/01/2012  . Hypothyroidism 02/01/2012    Her Past Surgical History Is Significant For: Past Surgical History  Procedure Laterality Date  . Nasal septum surgery      Her Family History Is Significant For: Family History  Problem Relation Age of Onset  . Cancer Father   . Hypertension Father   . Breast cancer Maternal Grandmother   . Heart failure Mother   . Osteoarthritis Mother   . Depression Mother   . Hypertension Mother   . Cancer Paternal Grandmother   . Cancer Paternal Grandfather   . Heart attack Paternal Grandfather     Her Social History Is Significant For: History   Social History  . Marital Status: Married    Spouse Name: N/A    Number of Children: N/A  . Years of Education: N/A  Social History Main Topics  . Smoking status: Former Smoker    Quit date: 04/25/1991  . Smokeless tobacco: Never Used  . Alcohol Use: 5.0 oz/week    10 drink(s) per week     Comment: alcohol about 10 drinks a week  . Drug Use: No  . Sexual Activity: Yes    Partners: Male     Comment: postmenopausal   Other Topics Concern  . None   Social History Narrative  . None    Her Allergies Are:  Allergies  Allergen Reactions  . Penicillins   . Sulfa Antibiotics   . Xylocaine [Lidocaine]     Doesn't numb  :   Her Current Medications Are:  Outpatient Encounter Prescriptions as of 06/20/2013   Medication Sig  . belladonna alk-PHENObarbital (DONNATAL) 16.2 MG tablet Take 1 tablet by mouth as needed.  . budesonide (ENTOCORT EC) 3 MG 24 hr capsule Take 6 mg by mouth every morning.  . clonazePAM (KLONOPIN) 1 MG tablet Take 1 mg by mouth daily.  Marland Kitchen estradiol (ESTRACE) 0.5 MG tablet Take 1 tablet (0.5 mg total) by mouth daily.  . fluticasone (FLONASE) 50 MCG/ACT nasal spray Place 2 sprays into the nose daily as needed.  . lamoTRIgine (LAMICTAL) 200 MG tablet Take 200 mg by mouth daily.  Marland Kitchen levothyroxine (SYNTHROID, LEVOTHROID) 100 MCG tablet Take 100 mcg by mouth daily before breakfast.  . loperamide (IMODIUM) 2 MG capsule Take 2 mg by mouth 4 (four) times daily as needed for diarrhea or loose stools.  Marland Kitchen loratadine (CLARITIN) 10 MG tablet Take 10 mg by mouth daily.  . Lurasidone HCl (LATUDA) 60 MG TABS Take by mouth.  Marland Kitchen NEXIUM 40 MG capsule   . progesterone (PROMETRIUM) 100 MG capsule TAKE 1 CAPSULE DAILY  . Vitamin D, Ergocalciferol, (DRISDOL) 50000 UNITS CAPS capsule once a week.  . sertraline (ZOLOFT) 50 MG tablet   :  Review of Systems:  Out of a complete 14 point review of systems, all are reviewed and negative with the exception of these symptoms as listed below:   Review of Systems  Constitutional: Positive for activity change (disinterest) and fatigue.  HENT: Negative.   Eyes: Negative.   Respiratory: Positive for shortness of breath.   Cardiovascular: Negative.   Gastrointestinal: Positive for diarrhea.  Endocrine:       Flushing  Genitourinary: Negative.   Musculoskeletal: Negative.   Skin: Negative.   Allergic/Immunologic: Positive for environmental allergies.  Neurological: Negative.   Hematological: Bruises/bleeds easily.  Psychiatric/Behavioral: Positive for sleep disturbance (snoring, e.d.s.) and dysphoric mood. The patient is nervous/anxious.     Objective:  Neurologic Exam  Physical Exam Physical Examination:   Filed Vitals:   06/20/13 0857  BP:  131/85  Pulse: 73  Temp: 97.9 F (36.6 C)    General Examination: The patient is a very pleasant 58 y.o. female in no acute distress. She appears well-developed and well-nourished and well groomed. She is obese.   HEENT: Normocephalic, atraumatic, pupils are equal, round and reactive to light and accommodation. Funduscopic exam is normal with sharp disc margins noted. Extraocular tracking is good without limitation to gaze excursion or nystagmus noted. Normal smooth pursuit is noted. Hearing is grossly intact. Tympanic membranes are clear bilaterally. Face is symmetric with normal facial animation and normal facial sensation. Speech is clear with no dysarthria noted. There is no hypophonia. There is no lip, neck/head, jaw or voice tremor. Neck is supple with full range of passive and active motion. There  are no carotid bruits on auscultation. Oropharynx exam reveals: mild mouth dryness, adequate dental hygiene and moderate airway crowding, due to elongated uvula and tongue. Mallampati is class II. Tongue protrudes centrally and palate elevates symmetrically. Tonsils are small. Neck size is 15.75 inches. She has a Mild overbite. Nasal inspection reveals no significant nasal mucosal bogginess or redness and there is mild septal deviation to the R.   Chest: Clear to auscultation without wheezing, rhonchi or crackles noted.  Heart: S1+S2+0, regular and normal without murmurs, rubs or gallops noted.   Abdomen: Soft, non-tender and non-distended with normal bowel sounds appreciated on auscultation.  Extremities: There is no pitting edema in the distal lower extremities bilaterally. Pedal pulses are intact.  Skin: Warm and dry without trophic changes noted. There are no varicose veins.  Musculoskeletal: exam reveals no obvious joint deformities, tenderness or joint swelling or erythema.   Neurologically:  Mental status: The patient is awake, alert and oriented in all 4 spheres. Her immediate and  remote memory, attention, language skills and fund of knowledge are appropriate. There is no evidence of aphasia, agnosia, apraxia or anomia. Speech is clear with normal prosody and enunciation. Thought process is linear. Mood is normal and affect is normal.  Cranial nerves II - XII are as described above under HEENT exam. In addition: shoulder shrug is normal with equal shoulder height noted. Motor exam: Normal bulk, strength and tone is noted. There is no drift, tremor or rebound. Romberg is negative. Reflexes are 2+ throughout. Babinski: Toes are flexor bilaterally. Fine motor skills and coordination: intact with normal finger taps, normal hand movements, normal rapid alternating patting, normal foot taps and normal foot agility.  Cerebellar testing: No dysmetria or intention tremor on finger to nose testing. Heel to shin is unremarkable bilaterally. There is no truncal or gait ataxia.  Sensory exam: intact to light touch, pinprick, vibration, temperature sense proprioception in the upper and lower extremities.  Gait, station and balance: She stands easily. No veering to one side is noted. No leaning to one side is noted. Posture is age-appropriate and stance is narrow based. Gait shows normal stride length and normal pace. No problems turning are noted. She turns en bloc. Tandem walk is unremarkable. Intact toe and heel stance is noted.               Assessment and Plan:   In summary, TYSHANA NISHIDA is a very pleasant 58 y.o.-year old female with a history and physical exam concerning for obstructive sleep apnea (OSA). She has gained weight since her last sleep study. She reports recurrent headaches even though these are not typically morning headaches, she reports significant daytime somnolence that requires her to take a two-hour nap each day, she wakes up gasping for air and her husband reports that she snores. Of note, her husband uses a CPAP machine and sleeps well. He may not be aware of her  apneas. She kicks in her sleep but this has improved since she has been on clonazepam. I had a long chat with the patient about my findings and the diagnosis of OSA, its prognosis and treatment options. We talked about medical treatments, surgical interventions and non-pharmacological approaches. I explained in particular the risks and ramifications of untreated moderate to severe OSA, especially with respect to developing cardiovascular disease down the Road, including congestive heart failure, difficult to treat hypertension, cardiac arrhythmias, or stroke. Even type 2 diabetes has, in part, been linked to untreated OSA. Symptoms of untreated  OSA include daytime sleepiness, memory problems, mood irritability and mood disorder such as depression and anxiety, lack of energy, as well as recurrent headaches, especially morning headaches. We talked about trying to maintain a healthy lifestyle in general, as well as the importance of weight control. I encouraged the patient to eat healthy, exercise daily and keep well hydrated, to keep a scheduled bedtime and wake time routine, to not skip any meals and eat healthy snacks in between meals. I advised the patient not to drive when feeling sleepy. I recommended the following at this time: sleep study with potential positive airway pressure titration.  I explained the sleep test procedure to the patient and also outlined possible surgical and non-surgical treatment options of OSA, including the use of a custom-made dental device (which would require a referral to a specialist dentist or oral surgeon), upper airway surgical options, such as pillar implants, radiofrequency surgery, tongue base surgery, and UPPP (which would involve a referral to an ENT surgeon). Rarely, jaw surgery such as mandibular advancement may be considered.  I also explained the CPAP treatment option to the patient, who indicated that she would be willing to try CPAP if the need arises. I explained  the importance of being compliant with PAP treatment, not only for insurance purposes but primarily to improve Her symptoms, and for the patient's long term health benefit, including to reduce Her cardiovascular risks. I answered all her questions today and the patient was in agreement. I would like to see her back after the sleep study is completed and encouraged her to call with any interim questions, concerns, problems or updates.   Thank you very much for allowing me to participate in the care of this nice patient. If I can be of any further assistance to you please do not hesitate to call me at [phone].  Sincerely,   Star Age, MD, PhD

## 2013-06-20 NOTE — Patient Instructions (Addendum)
Based on your symptoms and your exam I believe you are at risk for obstructive sleep apnea or OSA, and I think we should proceed with a sleep study to determine whether you do or do not have OSA and how severe it is. If you have more than mild OSA, I want you to consider treatment with CPAP. Please remember, the risks and ramifications of moderate to severe obstructive sleep apnea or OSA are: Cardiovascular disease, including congestive heart failure, stroke, difficult to control hypertension, arrhythmias, and even type 2 diabetes has been linked to untreated OSA. Sleep apnea causes disruption of sleep and sleep deprivation in most cases, which, in turn, can cause recurrent headaches, problems with memory, mood, concentration, focus, and vigilance. Most people with untreated sleep apnea report excessive daytime sleepiness, which can affect their ability to drive. Please do not drive if you feel sleepy.  I will see you back after your sleep study to go over the test results and where to go from there. We will call you after your sleep study and to set up an appointment at the time.   Please remember to try to maintain good sleep hygiene, which means: Keep a regular sleep and wake schedule, try not to exercise or have a meal within 2 hours of your bedtime, try to keep your bedroom conducive for sleep, that is, cool and dark, without light distractors such as an illuminated alarm clock, and refrain from watching TV right before sleep or in the middle of the night and do not keep the TV or radio on during the night. Also, try not to use or play on electronic devices at bedtime, such as your cell phone, tablet PC or laptop. If you like to read at bedtime on an electronic device, try to dim the background light as much as possible. Do not eat in the middle of the night.

## 2013-07-31 ENCOUNTER — Ambulatory Visit (INDEPENDENT_AMBULATORY_CARE_PROVIDER_SITE_OTHER): Payer: BC Managed Care – PPO | Admitting: Neurology

## 2013-07-31 DIAGNOSIS — R519 Headache, unspecified: Secondary | ICD-10-CM

## 2013-07-31 DIAGNOSIS — R51 Headache: Secondary | ICD-10-CM

## 2013-07-31 DIAGNOSIS — R400 Somnolence: Secondary | ICD-10-CM

## 2013-07-31 DIAGNOSIS — G4733 Obstructive sleep apnea (adult) (pediatric): Secondary | ICD-10-CM

## 2013-07-31 DIAGNOSIS — E669 Obesity, unspecified: Secondary | ICD-10-CM

## 2013-08-14 ENCOUNTER — Telehealth: Payer: Self-pay | Admitting: Neurology

## 2013-08-14 DIAGNOSIS — G4733 Obstructive sleep apnea (adult) (pediatric): Secondary | ICD-10-CM

## 2013-08-14 NOTE — Telephone Encounter (Signed)
Please call and notify the patient that the recent sleep study did confirm the diagnosis of obstructive sleep apnea and that I recommend treatment for this in the form of CPAP. This will require a repeat sleep study for proper titration and mask fitting. Please explain to patient and arrange for a CPAP titration study. I have placed an order in the chart. Thanks, Mauria Asquith, MD, PhD [facility]  

## 2013-08-19 NOTE — Telephone Encounter (Signed)
Patient was contacted and sleep study results reviewed with her.  Pt understands the dx of obstructive sleep apnea and the need to return for a full night titration study.  She has been scheduled for 10/01/2013.  A copy of her sleep study report has been sent to Dr. Sheralyn Boatman and will be mailed to the patients home address.

## 2013-10-01 ENCOUNTER — Ambulatory Visit (INDEPENDENT_AMBULATORY_CARE_PROVIDER_SITE_OTHER): Payer: BC Managed Care – PPO

## 2013-10-01 VITALS — BP 146/93

## 2013-10-01 DIAGNOSIS — G4733 Obstructive sleep apnea (adult) (pediatric): Secondary | ICD-10-CM

## 2013-10-16 ENCOUNTER — Telehealth: Payer: Self-pay | Admitting: Neurology

## 2013-10-16 DIAGNOSIS — E669 Obesity, unspecified: Secondary | ICD-10-CM

## 2013-10-16 DIAGNOSIS — G4733 Obstructive sleep apnea (adult) (pediatric): Secondary | ICD-10-CM

## 2013-10-16 NOTE — Telephone Encounter (Signed)
Please call and inform patient that I have entered an order for treatment with PAP. She fairly well during the latest sleep study with CPAP. We will, therefore, arrange for a machine for home use through a DME (durable medical equipment) company of Her choice; and I will see the patient back in follow-up in about 6 weeks. Please also explain to the patient that I will be looking out for compliance data downloaded from the machine, which can be done remotely through a modem at times or stored on an SD card in the back of the machine. At the time of the followup appointment we will discuss sleep study results and how it is going with PAP treatment at home. Please advise patient to bring Her machine at the time of the visit; at least for the first visit, even though this is cumbersome. Bringing the machine for every visit after that may not be needed, but often helps for the first visit. Please also make sure, the patient has a follow-up appointment with me in about 6 weeks from the setup date, thanks.   Star Age, MD, PhD [facility])

## 2013-10-17 ENCOUNTER — Encounter: Payer: Self-pay | Admitting: Neurology

## 2013-10-21 NOTE — Telephone Encounter (Signed)
Patient returned my phone call regarding her sleep study results.  Patient was informed that she did well with use of the CPAP therapy and that it was effective in treating her sleep apnea.  Patient was advised that we would mail her a copy of the MD's impression for her review and that a copy would be provided to the referring MD, Sheralyn Boatman.  Patient will be set up with [employer] as her DME.

## 2013-11-28 ENCOUNTER — Encounter: Payer: Self-pay | Admitting: Neurology

## 2013-12-02 ENCOUNTER — Encounter: Payer: Self-pay | Admitting: Neurology

## 2013-12-16 ENCOUNTER — Encounter: Payer: Self-pay | Admitting: Neurology

## 2013-12-17 ENCOUNTER — Ambulatory Visit (INDEPENDENT_AMBULATORY_CARE_PROVIDER_SITE_OTHER): Payer: BC Managed Care – PPO | Admitting: Neurology

## 2013-12-17 ENCOUNTER — Encounter: Payer: Self-pay | Admitting: Neurology

## 2013-12-17 VITALS — BP 124/73 | HR 69 | Temp 98.5°F | Ht 68.0 in | Wt 232.0 lb

## 2013-12-17 DIAGNOSIS — G4733 Obstructive sleep apnea (adult) (pediatric): Secondary | ICD-10-CM

## 2013-12-17 DIAGNOSIS — Z9989 Dependence on other enabling machines and devices: Secondary | ICD-10-CM

## 2013-12-17 NOTE — Progress Notes (Signed)
Subjective:    Patient ID: ADENA SIMA is a 58 y.o. female.  HPI     Interim history:   Ms. Brocks is a 58 year old right-handed woman with an underlying medical history of ulcerative colitis, obesity and bipolar disorder, who presents for follow-up consultation of her obstructive sleep apnea. The patient is unaccompanied today. I first met her on 06/20/2013 at the request of her psychiatrist, at which time she reported snoring, excessive daytime somnolence as well as recurrent headaches. She reported leg twitching during the sleep study in 2006 but no obstructive sleep apnea at the time. She did report some weight gain since then. I invited her back for sleep study. She had baseline sleep study on 07/31/2013, followed by a CPAP titration study on 10/01/2013 and went over her test results with her in detail today.  Her baseline sleep study on 07/31/2013 showed a sleep efficiency of 66.4% with a latency to sleep of 95.5 minutes and wake after sleep onset of 64.5 minutes with moderate sleep fragmentation noted. She had a normal arousal index. She had an increased percentage of stage II sleep, absence of slow-wave sleep and a decreased percentage of REM sleep with a prolonged REM latency. She had no significant PLMS. She had mild to moderate snoring. Total AHI was 17.7 per hour, rising to 58.6 per hour during REM sleep. Average oxygen saturation was 91%, nadir was 78% during REM sleep. Based on her test results of moderate obstructive sleep apnea I asked her to come back for full night CPAP titration study which was on 10/01/2013. Sleep efficiency was low at 35.9%. She had a prolonged sleep latency and wake after sleep onset was 154.5 minutes with mild sleep fragmentation noted but a long period of wakefulness between 1 AM and 2:30 AM. She had absence of slow-wave and REM sleep. She was titrated with CPAP from 5-8 cm. On the final pressure her AHI was 0 per hour. Despite the limited results with absence  of REM sleep and poor sleep consolidation as well as limited sleep noted during the study I suggested a trial of CPAP. I prescribed CPAP for home use for her. I reviewed her compliance data from 10/29/2013 through 11/27/2013 which is a total of 30 days during which time she used her machine every night. Percent used days greater than 4 hours was 93%, average usage of 9 hours and 18 minutes, residual AHI at 2.4 per hour, leak low. Pressure at 8 cm with EPR of 2.   Today, I reviewed her compliance data from 11/17/2013 through 12/16/2013 which is a total of 30 days during which time she used her machine every night. Percent used days greater than 4 hours was 97%. Average usage of 10 hours and 7 minutes. Pressure at 8 cm with EPR of 2. Residual AHI at 1.4 per hour, leak at 4.8 for the 95th percentile.  Today, she reports sleeping better, she feels better rested. She feels that her mood is better. She feels less sleepy during the day. She also feels that her ulcerative colitis is more stable. Her blood pressure certainly has improved consistently. She is compliant with treatment. She is pleased with how she is doing and has no new complaints today.  Her typical bedtime is reported to be around 1 AM and usual wake time is around 10 to 11 AM. Sleep onset typically occurs within a few minutes. She reports feeling adequately rested upon awakening. She wakes up on an average 2 to 3 times  in the middle of the night and has to go to the bathroom 1 to 2 times on a typical night.   She reports excessive daytime somnolence (EDS) and Her Epworth Sleepiness Score (ESS) is 11/24 today. She has not fallen asleep while driving. The patient has been taking a planned nap, which is usually 2 hours long, after lunch. There is some report of nighttime reflux, with rare nighttime cough experienced. She is known to kick while asleep or before falling asleep. There is family history of OSA in her mother.   She is a restless sleeper  and in the morning, the bed is quite disheveled.    She denies cataplexy, sleep paralysis, hypnagogic or hypnopompic hallucinations, or sleep attacks. She does not report any nightmares, dream enactments, or parasomnias, such as sleep talking or sleep walking. She consumes 2 to 3 caffeinated beverages per day, usually in the form of coffee and Oolong tea.    Her bedroom is usually dark and cool. There is a TV in the bedroom and usually it is not on at night.   Her Past Medical History Is Significant For: Past Medical History  Diagnosis Date  . Anemia, unspecified 04/18/2012  . Menopause   . Bipolar 2 disorder   . Hypercholesteremia   . Migraine   . Ischemic colitis   . Ulcerative colitis 02/01/2012  . Hypothyroidism 02/01/2012    Her Past Surgical History Is Significant For: Past Surgical History  Procedure Laterality Date  . Nasal septum surgery      Her Family History Is Significant For: Family History  Problem Relation Age of Onset  . Cancer Father   . Hypertension Father   . Breast cancer Maternal Grandmother   . Heart failure Mother   . Osteoarthritis Mother   . Depression Mother   . Hypertension Mother   . Cancer Paternal Grandmother   . Cancer Paternal Grandfather   . Heart attack Paternal Grandfather     Her Social History Is Significant For: History   Social History  . Marital Status: Married    Spouse Name: N/A    Number of Children: N/A  . Years of Education: N/A   Social History Main Topics  . Smoking status: Former Smoker    Quit date: 04/25/1991  . Smokeless tobacco: Never Used  . Alcohol Use: 5.0 oz/week    10 drink(s) per week     Comment: alcohol about 10 drinks a week  . Drug Use: No  . Sexual Activity:    Partners: Male     Comment: postmenopausal   Other Topics Concern  . None   Social History Narrative    Her Allergies Are:  Allergies  Allergen Reactions  . Penicillins   . Sulfa Antibiotics   . Xylocaine [Lidocaine]     Doesn't  numb  :   Her Current Medications Are:  Outpatient Encounter Prescriptions as of 12/17/2013  Medication Sig  . belladonna alk-PHENObarbital (DONNATAL) 16.2 MG tablet Take 1 tablet by mouth as needed.  . budesonide (ENTOCORT EC) 3 MG 24 hr capsule Take 6 mg by mouth every morning.  . clonazePAM (KLONOPIN) 1 MG tablet Take 1 mg by mouth daily.  Marland Kitchen estradiol (ESTRACE) 0.5 MG tablet Take 1 tablet (0.5 mg total) by mouth daily.  . fluticasone (FLONASE) 50 MCG/ACT nasal spray Place 2 sprays into the nose daily as needed.  . lamoTRIgine (LAMICTAL) 200 MG tablet Take 200 mg by mouth daily.  Marland Kitchen levothyroxine (SYNTHROID, LEVOTHROID) 100  MCG tablet Take 100 mcg by mouth daily before breakfast.  . loperamide (IMODIUM) 2 MG capsule Take 2 mg by mouth 4 (four) times daily as needed for diarrhea or loose stools.  Marland Kitchen loratadine (CLARITIN) 10 MG tablet Take 10 mg by mouth daily.  . Lurasidone HCl (LATUDA) 60 MG TABS Take by mouth.  Marland Kitchen NEXIUM 40 MG capsule   . progesterone (PROMETRIUM) 100 MG capsule TAKE 1 CAPSULE DAILY  . Vitamin D, Ergocalciferol, (DRISDOL) 50000 UNITS CAPS capsule once a week.  . [DISCONTINUED] sertraline (ZOLOFT) 50 MG tablet   :  Review of Systems:  Out of a complete 14 point review of systems, all are reviewed and negative with the exception of these symptoms as listed below:   Review of Systems  HENT:       Runny nose  Eyes: Positive for itching.  Neurological:       Apnea  Hematological: Bruises/bleeds easily.  Psychiatric/Behavioral:       Depression    Objective:  Neurologic Exam  Physical Exam Physical Examination:   Filed Vitals:   12/17/13 1429  BP: 124/73  Pulse: 69  Temp: 98.5 F (36.9 C)    General Examination: The patient is a very pleasant 58 y.o. female in no acute distress. She appears well-developed and well-nourished and well groomed. She is obese. She is in good spirits today.  HEENT: Normocephalic, atraumatic, pupils are equal, round and  reactive to light and accommodation. Funduscopic exam is normal with sharp disc margins noted. Extraocular tracking is good without limitation to gaze excursion or nystagmus noted. Normal smooth pursuit is noted. Hearing is grossly intact. Face is symmetric with normal facial animation and normal facial sensation. Speech is clear with no dysarthria noted. There is no hypophonia. There is no lip, neck/head, jaw or voice tremor. Neck is supple with full range of passive and active motion. There are no carotid bruits on auscultation. Oropharynx exam reveals: mild mouth dryness, adequate dental hygiene and moderate airway crowding, due to elongated uvula and tongue. Mallampati is class II. Tongue protrudes centrally and palate elevates symmetrically. Tonsils are small. Neck size is 15.75 inches. She has a Mild overbite. Nasal inspection reveals no significant nasal mucosal bogginess or redness and there is mild septal deviation to the R, unchanged. She has no soreness around her nostrils from using nasal pillows.   Chest: Clear to auscultation without wheezing, rhonchi or crackles noted.  Heart: S1+S2+0, regular and normal without murmurs, rubs or gallops noted.   Abdomen: Soft, non-tender and non-distended with normal bowel sounds appreciated on auscultation.  Extremities: There is no pitting edema in the distal lower extremities bilaterally. Pedal pulses are intact.  Skin: Warm and dry without trophic changes noted. There are no varicose veins.  Musculoskeletal: exam reveals no obvious joint deformities, tenderness or joint swelling or erythema.   Neurologically:  Mental status: The patient is awake, alert and oriented in all 4 spheres. Her immediate and remote memory, attention, language skills and fund of knowledge are appropriate. There is no evidence of aphasia, agnosia, apraxia or anomia. Speech is clear with normal prosody and enunciation. Thought process is linear. Mood is normal and affect is  normal.  Cranial nerves II - XII are as described above under HEENT exam. In addition: shoulder shrug is normal with equal shoulder height noted. Motor exam: Normal bulk, strength and tone is noted. There is no drift, tremor or rebound. Romberg is negative. Reflexes are 2+ throughout. Babinski: Toes are flexor bilaterally.  Fine motor skills and coordination: intact with normal finger taps, normal hand movements, normal rapid alternating patting, normal foot taps and normal foot agility.  Cerebellar testing: No dysmetria or intention tremor on finger to nose testing. Heel to shin is unremarkable bilaterally. There is no truncal or gait ataxia.  Sensory exam: intact to light touch, pinprick, vibration, temperature sense in the upper and lower extremities.  Gait, station and balance: She stands easily. No veering to one side is noted. No leaning to one side is noted. Posture is age-appropriate and stance is narrow based. Gait shows normal stride length and normal pace. No problems turning are noted. She turns en bloc. Tandem walk is unremarkable. Intact toe stance is noted.               Assessment and Plan:   In summary, PALMIRA STICKLE is a very pleasant 58 y.o.-year old female with an underlying medical history of ulcerative colitis, obesity and bipolar disorder, who presents for follow-up consultation of her obstructive sleep apnea, now on treatment with CPAP. She has been compliant with treatment and feels much improved in multiple aspects including her mood disorder, ulcerative colitis symptoms as in diarrhea, daytime somnolence, morning headaches and even her nocturia. She is pleased with how she is doing. Today I advised her about her sleep test results in detail and we talked about her compliance data as well and I explained the numbers to her in detail. We talked about trying to maintain a healthy lifestyle in general, as well as the importance of weight control. I explained the importance of being  compliant with PAP treatment, not only for insurance purposes but primarily to improve Her symptoms, and for the patient's long term health benefit, including to reduce Her cardiovascular risks. Since she is doing well from my end of things I would like to see her back in 6 months routinely and yearly thereafter if she continues to do well. I answered all her questions today and the patient was in agreement. I encouraged her to call with any interim questions, concerns, problems or updates.

## 2013-12-17 NOTE — Patient Instructions (Signed)

## 2013-12-31 ENCOUNTER — Telehealth: Payer: Self-pay | Admitting: Gynecology

## 2013-12-31 NOTE — Telephone Encounter (Signed)
Left message regarding upcoming appointment has been canceled and needs to be rescheduled. °

## 2014-02-03 ENCOUNTER — Other Ambulatory Visit: Payer: Self-pay

## 2014-02-03 DIAGNOSIS — Z1231 Encounter for screening mammogram for malignant neoplasm of breast: Secondary | ICD-10-CM

## 2014-02-20 ENCOUNTER — Ambulatory Visit
Admission: RE | Admit: 2014-02-20 | Discharge: 2014-02-20 | Disposition: A | Discharge status: Home / Self Care | Payer: BLUE CROSS/BLUE SHIELD | Source: Ambulatory Visit

## 2014-02-20 DIAGNOSIS — Z1231 Encounter for screening mammogram for malignant neoplasm of breast: Secondary | ICD-10-CM

## 2014-04-28 ENCOUNTER — Ambulatory Visit: Payer: BC Managed Care – PPO | Admitting: Gynecology

## 2014-04-28 ENCOUNTER — Ambulatory Visit (INDEPENDENT_AMBULATORY_CARE_PROVIDER_SITE_OTHER): Payer: BLUE CROSS/BLUE SHIELD | Admitting: Nurse Practitioner

## 2014-04-28 ENCOUNTER — Encounter: Payer: Self-pay | Admitting: Nurse Practitioner

## 2014-04-28 VITALS — BP 126/74 | HR 80 | Ht 67.0 in | Wt 232.0 lb

## 2014-04-28 DIAGNOSIS — Z01419 Encounter for gynecological examination (general) (routine) without abnormal findings: Secondary | ICD-10-CM

## 2014-04-28 DIAGNOSIS — F3181 Bipolar II disorder: Secondary | ICD-10-CM

## 2014-04-28 DIAGNOSIS — Z79890 Hormone replacement therapy (postmenopausal): Secondary | ICD-10-CM | POA: Diagnosis not present

## 2014-04-28 DIAGNOSIS — K5190 Ulcerative colitis, unspecified, without complications: Secondary | ICD-10-CM

## 2014-04-28 DIAGNOSIS — Z0000 Encounter for general adult medical examination without abnormal findings: Secondary | ICD-10-CM

## 2014-04-28 MED ORDER — PROGESTERONE MICRONIZED 100 MG PO CAPS: ORAL_CAPSULE | ORAL | Status: DC

## 2014-04-28 MED ORDER — ESTRADIOL 0.5 MG PO TABS: 0.5000 mg | ORAL_TABLET | Freq: Every day | ORAL | Status: DC

## 2014-04-28 NOTE — Progress Notes (Signed)
Patient ID: Jessica Berger, female   DOB: [birth year], 59 y.o.   MRN: [id number] 58 y.o. [id number] Married  Caucasian Fe here for annual exam.  No new health problems.  Wants to continue with HRT.  Patient's last menstrual period was 01/31/2002.          Sexually active: Yes.    The current method of family planning is vasectomy.    Exercising: Yes.    Home exercise routine includes DVD routine 3-4 times per week. Smoker:  no  Health Maintenance: Pap:  04/24/12, negative with neg HR HPV MMG:  02/24/14, 3D, Bi-Rads 1:  Negative Colonoscopy:  01/12/12, polyp, repeat in 3 years due to UC and family history of colon cancer BMD:   Never  TDaP:  Dr. Inda Berger Labs:  Dr. Inda Berger   reports that she quit smoking about 23 years ago. She has never used smokeless tobacco. She reports that she drinks about 3.6 oz of alcohol per week. She reports that she does not use illicit drugs.  Past Medical History  Diagnosis Date  . Anemia, unspecified 04/18/2012  . Menopause   . Bipolar 2 disorder   . Hypercholesteremia   . Migraine   . Ischemic colitis 2010  . Ulcerative colitis 02/01/2012  . Hypothyroidism 02/01/2012    Past Surgical History  Procedure Laterality Date  . Nasal septum surgery      Current Outpatient Prescriptions  Medication Sig Dispense Refill  . belladonna alk-PHENObarbital (DONNATAL) 16.2 MG tablet Take 1 tablet by mouth as needed.    . budesonide (ENTOCORT EC) 3 MG 24 hr capsule Take 6 mg by mouth every morning.    . clonazePAM (KLONOPIN) 1 MG tablet Take 1 mg by mouth daily.    Marland Kitchen estradiol (ESTRACE) 0.5 MG tablet Take 1 tablet (0.5 mg total) by mouth daily. 90 tablet 3  . fluticasone (FLONASE) 50 MCG/ACT nasal spray Place 2 sprays into the nose daily as needed.    . lamoTRIgine (LAMICTAL) 200 MG tablet Take 200 mg by mouth daily.    Marland Kitchen levothyroxine (SYNTHROID, LEVOTHROID) 100 MCG tablet Take 100 mcg by mouth daily before breakfast.    . loperamide (IMODIUM) 2 MG capsule Take 2 mg by mouth  4 (four) times daily as needed for diarrhea or loose stools.    Marland Kitchen loratadine (CLARITIN) 10 MG tablet Take 10 mg by mouth daily.    . Lurasidone HCl (LATUDA) 60 MG TABS Take by mouth.    Marland Kitchen NEXIUM 40 MG capsule     . progesterone (PROMETRIUM) 100 MG capsule TAKE 1 CAPSULE DAILY 90 capsule 3  . REXULTI 0.5 MG TABS Take 1 tablet by mouth daily.    . Vitamin D, Ergocalciferol, (DRISDOL) 50000 UNITS CAPS capsule Take 50,000 Units by mouth once a week. Vitamin D     No current facility-administered medications for this visit.    Family History  Problem Relation Age of Onset  . Colon cancer Father 1    Colon  . Hypertension Father   . Breast cancer Maternal Grandmother   . Heart failure Mother   . Osteoarthritis Mother   . Depression Mother   . Hypertension Mother   . Stroke Mother   . Cancer Paternal Grandmother     skin cancer, nose  . Heart attack Paternal Grandfather   . Cancer Maternal Grandfather     lung    ROS:  Pertinent items are noted in HPI.  Otherwise, a comprehensive ROS was negative.  Exam:  BP 126/74 mmHg  Pulse 80  Ht 5' 7"  (1.702 m)  Wt 232 lb (105.235 kg)  BMI 36.33 kg/m2  LMP 01/31/2002 Height: 5' 7"  (170.2 cm) Ht Readings from Last 3 Encounters:  04/28/14 5' 7"  (1.702 m)  12/17/13 5' 8"  (1.727 m)  06/20/13 5' 8"  (1.727 m)    General appearance: alert, cooperative and appears stated age Head: Normocephalic, without obvious abnormality, atraumatic Neck: no adenopathy, supple, symmetrical, trachea midline and thyroid normal to inspection and palpation Lungs: clear to auscultation bilaterally Breasts: normal appearance, no masses or tenderness Heart: regular rate and rhythm Abdomen: soft, non-tender; no masses,  no organomegaly Extremities: extremities normal, atraumatic, no cyanosis or edema Skin: Skin color, texture, turgor normal. No rashes or lesions Lymph nodes: Cervical, supraclavicular, and axillary nodes normal. No abnormal inguinal nodes  palpated Neurologic: Grossly normal   Pelvic: External genitalia:  no lesions, small inclusion cyst left labia X 3 that are expressed for her.              Urethra:  normal appearing urethra with no masses, tenderness or lesions              Bartholin's and Skene's: normal                 Vagina: normal appearing vagina with normal color and discharge, no lesions              Cervix: anteverted              Pap taken: No. Bimanual Exam:  Uterus:  normal size, contour, position, consistency, mobility, non-tender              Adnexa: no mass, fullness, tenderness               Rectovaginal: Confirms               Anus:  normal sphincter tone, no lesions  Chaperone present:  yes  A:  Well Woman with normal exam  Postmenopausal on HRT since 2004  History of Ulcerative colitis  [name]: father with colon cancer  History of Bipolar depression   P:   Reviewed health and wellness pertinent to exam  Pap smear not taken today  Mammogram is due 02/2015  Refill on Estradiol 0.5 mg and Prometrium 100 mg daily for a year  Counseled with risk of DVT, CVA, cancer, etc.  Counseled on breast self exam, mammography screening, use and side effects of HRT, adequate intake of calcium and vitamin D, diet and exercise, Kegel's exercises return annually or prn  An After Visit Summary was printed and given to the patient.

## 2014-04-28 NOTE — Patient Instructions (Signed)

## 2014-04-30 NOTE — Progress Notes (Signed)
Encounter reviewed by Dr. Jes Costales Silva.  

## 2014-05-12 LAB — BASIC METABOLIC PANEL
BUN: 10 (ref 4–21)
Creatinine: 0.9 (ref <=1.1)
GLUCOSE: 98
Potassium: 4.2 (ref 3.4–5.3)
SODIUM: 139 (ref 137–147)

## 2014-05-12 LAB — HEPATIC FUNCTION PANEL
ALK PHOS: 64 (ref 25–125)
ALT: 13 (ref 7–35)
AST: 15 (ref 13–35)
Bilirubin, Total: 0.4

## 2014-05-16 ENCOUNTER — Ambulatory Visit
Admission: RE | Admit: 2014-05-16 | Discharge: 2014-05-16 | Disposition: A | Discharge status: Home / Self Care | Payer: BLUE CROSS/BLUE SHIELD | Source: Ambulatory Visit | Attending: Family Medicine | Admitting: Family Medicine

## 2014-05-16 ENCOUNTER — Other Ambulatory Visit: Payer: Self-pay | Admitting: Family Medicine

## 2014-05-16 DIAGNOSIS — R059 Cough, unspecified: Secondary | ICD-10-CM

## 2014-05-16 DIAGNOSIS — R05 Cough: Secondary | ICD-10-CM

## 2014-06-17 ENCOUNTER — Encounter: Payer: Self-pay | Admitting: Neurology

## 2014-06-17 ENCOUNTER — Ambulatory Visit (INDEPENDENT_AMBULATORY_CARE_PROVIDER_SITE_OTHER): Payer: BLUE CROSS/BLUE SHIELD | Admitting: Neurology

## 2014-06-17 VITALS — BP 128/88 | HR 78 | Resp 16 | Ht 68.0 in | Wt 233.0 lb

## 2014-06-17 DIAGNOSIS — F32A Depression, unspecified: Secondary | ICD-10-CM

## 2014-06-17 DIAGNOSIS — Z9989 Dependence on other enabling machines and devices: Principal | ICD-10-CM

## 2014-06-17 DIAGNOSIS — F329 Major depressive disorder, single episode, unspecified: Secondary | ICD-10-CM | POA: Diagnosis not present

## 2014-06-17 DIAGNOSIS — G4733 Obstructive sleep apnea (adult) (pediatric): Secondary | ICD-10-CM | POA: Diagnosis not present

## 2014-06-17 NOTE — Patient Instructions (Signed)
Please continue using your CPAP regularly. While your insurance requires that you use CPAP at least 4 hours each night on 70% of the nights, I recommend, that you not skip any nights and use it throughout the night if you can. Getting used to CPAP and staying with the treatment long term does take time and patience and discipline. Untreated obstructive sleep apnea when it is moderate to severe can have an adverse impact on cardiovascular health and raise her risk for heart disease, arrhythmias, hypertension, congestive heart failure, stroke and diabetes. Untreated obstructive sleep apnea causes sleep disruption, nonrestorative sleep, and sleep deprivation. This can have an impact on your day to day functioning and cause daytime sleepiness and impairment of cognitive function, memory loss, mood disturbance, and problems focussing. Using CPAP regularly can improve these symptoms.  Keep up the good work! I will see you back in 12 months for sleep apnea check up.

## 2014-06-17 NOTE — Progress Notes (Signed)
Subjective:    Patient ID: Jessica Berger is a 59 y.o. female.  HPI     Interim history:   Jessica Berger is a 59 year old right-handed woman with an underlying medical history of ulcerative colitis, obesity and bipolar disorder, who presents for follow-up consultation of Jessica obstructive sleep apnea. The patient is unaccompanied today. I last saw Jessica on 12/17/2013, at which time she reported she was sleeping better, she felt better rested. She felt that Jessica mood was better. She was less sleepy during the day. She even felt that Jessica ulcerative colitis was more stable. Jessica blood pressure values had improved as well. She was overall quite pleased with how she was doing. She was compliant with treatment and I encouraged Jessica to continue with CPAP therapy at the current settings.  Today, 06/17/2014: I reviewed Jessica CPAP compliance data from 05/17/2014 through 06/15/2014 which is a total of 30 days during which time she used Jessica machine 26 days with percent used days greater than 4 hours at 87%, indicating very good compliance with an average usage for all days of 9 hours and 26 minutes, residual AHI low at 1.5 per hour and leak low with the 95th percentile at 5.7 L/m on a pressure of 8 cm with EPR of 2.   Today, 06/17/2014: She reports doing well. She went to the beach for a few days and did not take Jessica machine with Jessica. She is planning to take Jessica machine to Jessica next trip. Sleep-wise she is pleased with how she is doing. When she was at the beach without Jessica CPAP machine she did not sleep as well. Mood wise, she is on a new medication, Rexulti, and this has helped. She still has some crying spells and depression but overall feels better with new medication. In addition, she also is on Lamictal, she no longer is on Zoloft, she is on [place] and she takes clonazepam. Jessica psychiatrist is Dr. Caprice Beaver.   Previously:   I first met Jessica on 06/20/2013 at the request of Jessica psychiatrist, at which time she reported  snoring, excessive daytime somnolence as well as recurrent headaches. She reported leg twitching during the sleep study in 2006 but no obstructive sleep apnea at the time. She did report some weight gain since then. I invited Jessica back for sleep study. She had baseline sleep study on 07/31/2013, followed by a CPAP titration study on 10/01/2013.    Jessica baseline sleep study on 07/31/2013 showed a sleep efficiency of 66.4% with a latency to sleep of 95.5 minutes and wake after sleep onset of 64.5 minutes with moderate sleep fragmentation noted. She had a normal arousal index. She had an increased percentage of stage II sleep, absence of slow-wave sleep and a decreased percentage of REM sleep with a prolonged REM latency. She had no significant PLMS. She had mild to moderate snoring. Total AHI was 17.7 per hour, rising to 58.6 per hour during REM sleep. Average oxygen saturation was 91%, nadir was 78% during REM sleep. Based on Jessica test results of moderate obstructive sleep apnea I asked Jessica to come back for full night CPAP titration study which was on 10/01/2013. Sleep efficiency was low at 35.9%. She had a prolonged sleep latency and wake after sleep onset was 154.5 minutes with mild sleep fragmentation noted but a long period of wakefulness between 1 AM and 2:30 AM. She had absence of slow-wave and REM sleep. She was titrated with CPAP from 5-8 cm. On the final  pressure Jessica AHI was 0 per hour. Despite the limited results with absence of REM sleep and poor sleep consolidation as well as limited sleep noted during the study I suggested a trial of CPAP. I prescribed CPAP for home use for Jessica.  I reviewed Jessica compliance data from 10/29/2013 through 11/27/2013 which is a total of 30 days during which time she used Jessica machine every night. Percent used days greater than 4 hours was 93%, average usage of 9 hours and 18 minutes, residual AHI at 2.4 per hour, leak low. Pressure at 8 cm with EPR of 2.   I reviewed Jessica  compliance data from 11/17/2013 through 12/16/2013 which is a total of 30 days during which time she used Jessica machine every night. Percent used days greater than 4 hours was 97%. Average usage of 10 hours and 7 minutes. Pressure at 8 cm with EPR of 2. Residual AHI at 1.4 per hour, leak at 4.8 for the 95th percentile.  Jessica typical bedtime is reported to be around 1 AM and usual wake time is around 10 to 11 AM. Sleep onset typically occurs within a few minutes. She reports feeling adequately rested upon awakening. She wakes up on an average 2 to 3 times in the middle of the night and has to go to the bathroom 1 to 2 times on a typical night.   She reports excessive daytime somnolence (EDS) and Jessica Epworth Sleepiness Score (ESS) is 11/24 today. She has not fallen asleep while driving. The patient has been taking a planned nap, which is usually 2 hours long, after lunch. There is some report of nighttime reflux, with rare nighttime cough experienced. She is known to kick while asleep or before falling asleep. There is family history of OSA in Jessica mother.   She is a restless sleeper and in the morning, the bed is quite disheveled.    She denies cataplexy, sleep paralysis, hypnagogic or hypnopompic hallucinations, or sleep attacks. She does not report any nightmares, dream enactments, or parasomnias, such as sleep talking or sleep walking. She consumes 2 to 3 caffeinated beverages per day, usually in the form of coffee and Oolong tea.    Jessica bedroom is usually dark and cool. There is a TV in the bedroom and usually it is not on at night.   Jessica Past Medical History Is Significant For: Past Medical History  Diagnosis Date  . Anemia, unspecified 04/18/2012  . Menopause   . Bipolar 2 disorder   . Hypercholesteremia   . Migraine   . Ischemic colitis 2010  . Ulcerative colitis 02/01/2012  . Hypothyroidism 02/01/2012    Jessica Past Surgical History Is Significant For: Past Surgical History  Procedure Laterality  Date  . Nasal septum surgery      Jessica Family History Is Significant For: Family History  Problem Relation Age of Onset  . Colon cancer Father 59    Colon  . Hypertension Father   . Breast cancer Maternal Grandmother   . Heart failure Mother   . Osteoarthritis Mother   . Depression Mother   . Hypertension Mother   . Stroke Mother   . Cancer Paternal Grandmother     skin cancer, nose  . Heart attack Paternal Grandfather   . Cancer Maternal Grandfather     lung    Jessica Social History Is Significant For: History   Social History  . Marital Status: Married    Spouse Name: N/A  . Number of Children: 1  .  Years of Education: BA   Occupational History  . N/A    Social History Main Topics  . Smoking status: Former Smoker    Quit date: 04/25/1991  . Smokeless tobacco: Never Used  . Alcohol Use: 3.6 oz/week    6 Standard drinks or equivalent per week     Comment: alcohol about 6 drinks a week  . Drug Use: No  . Sexual Activity:    Partners: Male     Comment: postmenopausal   Other Topics Concern  . None   Social History Narrative   2 cups of coffee a day, occasional tea     Jessica Allergies Are:  Allergies  Allergen Reactions  . Penicillins   . Sulfa Antibiotics   . Xylocaine [Lidocaine]     Doesn't numb  :   Jessica Current Medications Are:  Outpatient Encounter Prescriptions as of 06/17/2014  Medication Sig  . belladonna alk-PHENObarbital (DONNATAL) 16.2 MG tablet Take 1 tablet by mouth as needed.  . budesonide (ENTOCORT EC) 3 MG 24 hr capsule Take 6 mg by mouth every morning.  . clonazePAM (KLONOPIN) 1 MG tablet Take 1 mg by mouth daily.  . cyanocobalamin 100 MCG tablet Take 100 mcg by mouth daily.  Marland Kitchen estradiol (ESTRACE) 0.5 MG tablet Take 1 tablet (0.5 mg total) by mouth daily.  . fluticasone (FLONASE) 50 MCG/ACT nasal spray Place 2 sprays into the nose daily as needed.  . lamoTRIgine (LAMICTAL) 200 MG tablet Take 200 mg by mouth daily.  Marland Kitchen levothyroxine  (SYNTHROID, LEVOTHROID) 100 MCG tablet Take 100 mcg by mouth daily before breakfast.  . loperamide (IMODIUM) 2 MG capsule Take 2 mg by mouth 4 (four) times daily as needed for diarrhea or loose stools.  Marland Kitchen loratadine (CLARITIN) 10 MG tablet Take 10 mg by mouth daily.  . Lurasidone HCl (LATUDA) 60 MG TABS Take by mouth.  Marland Kitchen NEXIUM 40 MG capsule   . progesterone (PROMETRIUM) 100 MG capsule TAKE 1 CAPSULE DAILY  . REXULTI 0.5 MG TABS Take 1 tablet by mouth daily.  . Vitamin D, Ergocalciferol, (DRISDOL) 50000 UNITS CAPS capsule Take 50,000 Units by mouth once a week. Vitamin D   No facility-administered encounter medications on file as of 06/17/2014.  :  Review of Systems:  Out of a complete 14 point review of systems, all are reviewed and negative with the exception of these symptoms as listed below:   Review of Systems  All other systems reviewed and are negative.   Objective:  Neurologic Exam  Physical Exam Physical Examination:   Filed Vitals:   06/17/14 1558  BP: 128/88  Pulse: 78  Resp: 16    General Examination: The patient is a very pleasant 59 y.o. female in no acute distress. She appears well-developed and well-nourished and well groomed. She is obese. She is in good spirits today, but does become tearful when talking about Jessica Berger's move to [place] next month.   HEENT: Normocephalic, atraumatic, pupils are equal, round and reactive to light and accommodation. Funduscopic exam is normal with sharp disc margins noted. Extraocular tracking is good without limitation to gaze excursion or nystagmus noted. Normal smooth pursuit is noted. Hearing is grossly intact. Face is symmetric with normal facial animation and normal facial sensation. Speech is clear with no dysarthria noted. There is no hypophonia. There is no lip, neck/head, jaw or voice tremor. Neck is supple with full range of passive and active motion. There are no carotid bruits on auscultation. Oropharynx exam  reveals: mild mouth dryness, adequate dental hygiene and moderate airway crowding, due to elongated uvula and tongue. Mallampati is class II. Tongue protrudes centrally and palate elevates symmetrically. Tonsils are small. She has a Mild overbite. Nasal inspection reveals no significant nasal mucosal bogginess or redness and there is mild septal deviation to the R, unchanged. She has no soreness around Jessica nostrils from using nasal pillows.   Chest: Clear to auscultation without wheezing, rhonchi or crackles noted.  Heart: S1+S2+0, regular and normal without murmurs, rubs or gallops noted.   Abdomen: Soft, non-tender and non-distended with normal bowel sounds appreciated on auscultation.  Extremities: There is no pitting edema in the distal lower extremities bilaterally. Pedal pulses are intact.  Skin: Warm and dry without trophic changes noted. There are no varicose veins.  Musculoskeletal: exam reveals no obvious joint deformities, tenderness or joint swelling or erythema.   Neurologically:  Mental status: The patient is awake, alert and oriented in all 4 spheres. Jessica immediate and remote memory, attention, language skills and fund of knowledge are appropriate. There is no evidence of aphasia, agnosia, apraxia or anomia. Speech is clear with normal prosody and enunciation. Thought process is linear. Mood is normal and affect is normal.  Cranial nerves II - XII are as described above under HEENT exam. In addition: shoulder shrug is normal with equal shoulder height noted. Motor exam: Normal bulk, strength and tone is noted. There is no drift, tremor or rebound. Romberg is negative. Reflexes are 2+ throughout. Babinski: Toes are flexor bilaterally. Fine motor skills and coordination: intact with normal finger taps, normal hand movements, normal rapid alternating patting, normal foot taps and normal foot agility.  Cerebellar testing: No dysmetria or intention tremor on finger to nose testing. Heel  to shin is unremarkable bilaterally. There is no truncal or gait ataxia.  Sensory exam: intact to light touch, pinprick, vibration, temperature sense in the upper and lower extremities.  Gait, station and balance: She stands easily. No veering to one side is noted. No leaning to one side is noted. Posture is age-appropriate and stance is narrow based. Gait shows normal stride length and normal pace. No problems turning are noted. She turns en bloc. Tandem walk is unremarkable.  Assessment and Plan:   In summary, ADISSON DEAK is a very pleasant 59 year old female with an underlying medical history of ulcerative colitis, obesity and bipolar disorder, who presents for follow-up consultation of Jessica obstructive sleep apnea, now on treatment with CPAP. She has been compliant with treatment and reports ongoing good results with CPAP therapy. She is advised about Jessica recent compliance data and is congratulated on Jessica treatment adherence. She reported improvement in Jessica mood disorder, Jessica daytime somnolence, morning headaches and nocturia. She follows with Dr. Caprice Beaver for Jessica mood disorder and feels that the new medication has helped. Jessica physical exam and neurological exam are stable. She is encouraged to pursue weight loss.  I explained the importance of being compliant with PAP treatment, not only for insurance purposes but primarily to improve Jessica symptoms, and for the patient's long term health benefit, including to reduce Jessica cardiovascular risks. Since she is doing well from my end of things I would like to see Jessica back in one year. I answered all Jessica questions today and the patient was in agreement. I encouraged Jessica to call with any interim questions, concerns, problems or updates.  I spent 15 minutes in total face-to-face time with the patient, more than 50% of which was  spent in counseling and coordination of care, reviewing test results, reviewing medication and discussing or reviewing the diagnosis of  OSA, its prognosis and treatment options.

## 2014-08-02 LAB — POCT INR: INR: 1.1 (ref <=1.1)

## 2014-08-02 LAB — PROTIME-INR: PROTIME: 11.3 (ref 10.0–13.8)

## 2014-08-12 LAB — VITAMIN B12: Vitamin B-12: 454

## 2014-11-13 LAB — VITAMIN D 25 HYDROXY (VIT D DEFICIENCY, FRACTURES): VIT D 25 HYDROXY: 48.5

## 2014-12-16 ENCOUNTER — Telehealth: Payer: Self-pay | Admitting: Nurse Practitioner

## 2014-12-16 NOTE — Telephone Encounter (Signed)
Left message for patient to call us back and reschedule appointment for 04/29/2015 with Ms. Patty due to her being out of the office.

## 2015-01-15 ENCOUNTER — Other Ambulatory Visit: Payer: Self-pay

## 2015-01-15 DIAGNOSIS — Z1231 Encounter for screening mammogram for malignant neoplasm of breast: Secondary | ICD-10-CM

## 2015-02-23 ENCOUNTER — Ambulatory Visit
Admission: RE | Admit: 2015-02-23 | Discharge: 2015-02-23 | Disposition: A | Discharge status: Home / Self Care | Payer: BLUE CROSS/BLUE SHIELD | Source: Ambulatory Visit

## 2015-02-23 DIAGNOSIS — Z1231 Encounter for screening mammogram for malignant neoplasm of breast: Secondary | ICD-10-CM

## 2015-02-25 ENCOUNTER — Other Ambulatory Visit: Payer: Self-pay | Admitting: Nurse Practitioner

## 2015-02-25 DIAGNOSIS — R928 Other abnormal and inconclusive findings on diagnostic imaging of breast: Secondary | ICD-10-CM

## 2015-03-02 ENCOUNTER — Ambulatory Visit
Admission: RE | Admit: 2015-03-02 | Discharge: 2015-03-02 | Disposition: A | Discharge status: Home / Self Care | Payer: BLUE CROSS/BLUE SHIELD | Source: Ambulatory Visit | Attending: Nurse Practitioner | Admitting: Nurse Practitioner

## 2015-03-02 DIAGNOSIS — R928 Other abnormal and inconclusive findings on diagnostic imaging of breast: Secondary | ICD-10-CM

## 2015-04-29 ENCOUNTER — Ambulatory Visit: Payer: BLUE CROSS/BLUE SHIELD | Admitting: Nurse Practitioner

## 2015-04-30 ENCOUNTER — Ambulatory Visit: Payer: BLUE CROSS/BLUE SHIELD | Admitting: Nurse Practitioner

## 2015-05-12 ENCOUNTER — Encounter: Payer: Self-pay | Admitting: Nurse Practitioner

## 2015-05-12 ENCOUNTER — Ambulatory Visit (INDEPENDENT_AMBULATORY_CARE_PROVIDER_SITE_OTHER): Payer: BLUE CROSS/BLUE SHIELD | Admitting: Nurse Practitioner

## 2015-05-12 VITALS — BP 122/78 | HR 84 | Resp 16 | Ht 66.0 in | Wt 241.0 lb

## 2015-05-12 DIAGNOSIS — Z0000 Encounter for general adult medical examination without abnormal findings: Secondary | ICD-10-CM | POA: Diagnosis not present

## 2015-05-12 DIAGNOSIS — Z01419 Encounter for gynecological examination (general) (routine) without abnormal findings: Secondary | ICD-10-CM | POA: Diagnosis not present

## 2015-05-12 DIAGNOSIS — Z1151 Encounter for screening for human papillomavirus (HPV): Secondary | ICD-10-CM | POA: Diagnosis not present

## 2015-05-12 DIAGNOSIS — K629 Disease of anus and rectum, unspecified: Secondary | ICD-10-CM

## 2015-05-12 DIAGNOSIS — Z79890 Hormone replacement therapy (postmenopausal): Secondary | ICD-10-CM

## 2015-05-12 LAB — POCT URINALYSIS DIPSTICK
Bilirubin, UA: NEGATIVE
Blood, UA: NEGATIVE
Glucose, UA: NEGATIVE
Ketones, UA: NEGATIVE
Leukocytes, UA: NEGATIVE
Nitrite, UA: NEGATIVE
UROBILINOGEN UA: NEGATIVE
pH, UA: 5

## 2015-05-12 LAB — HEPATITIS C ANTIBODY: HCV AB: NEGATIVE

## 2015-05-12 LAB — HIV ANTIBODY (ROUTINE TESTING W REFLEX): HIV: NONREACTIVE

## 2015-05-12 MED ORDER — ESTRADIOL 0.5 MG PO TABS: 0.5000 mg | ORAL_TABLET | Freq: Every day | ORAL | Status: DC

## 2015-05-12 MED ORDER — PROGESTERONE MICRONIZED 100 MG PO CAPS: ORAL_CAPSULE | ORAL | Status: DC

## 2015-05-12 NOTE — Patient Instructions (Signed)

## 2015-05-12 NOTE — Progress Notes (Signed)
60 y.o. G4P1001 Married  Caucasian Fe here for annual exam.  Having problems with frequent bruising both arms and seen by Dermatologist.  She is having a rough red area with crusty like discharge at the left perianal area.  She has been unable to see the area very well.  She does not relate this to SA, change in soaps, or activity. She also describes a "poof" of air that comes out while sitting from the vagina.  She then sends an e-mail and clarify that it comes from the urethra.   She also notes some dyspnea with exertion and she plans to discuss with PCP next week.  Patient's last menstrual period was 01/31/2002.          Sexually active: Yes.    The current method of family planning is post menopausal status.    Exercising: Yes.    light aerobics, strength training Smoker:  no  Health Maintenance: Pap:  04/24/12 HR HPV negative MMG: 03/02/15 BIRADS1 negative Colonoscopy:  01/12/12 repeat 5-10 years TDaP:  PCP Hep C and HIV: done today Labs: PCP   Urine: trace protein   reports that she quit smoking about 24 years ago. She has never used smokeless tobacco. She reports that she drinks about 3.6 oz of alcohol per week. She reports that she does not use illicit drugs.  Past Medical History  Diagnosis Date  . Anemia, unspecified 04/18/2012  . Menopause   . Bipolar 2 disorder ([place])   . Hypercholesteremia   . Migraine   . Ischemic colitis ([place]) 2010  . Ulcerative colitis ([place]) 02/01/2012  . Hypothyroidism 02/01/2012    Past Surgical History  Procedure Laterality Date  . Nasal septum surgery      Current Outpatient Prescriptions  Medication Sig Dispense Refill  . belladonna alk-PHENObarbital (DONNATAL) 16.2 MG tablet Take 1 tablet by mouth as needed.    . clonazePAM (KLONOPIN) 1 MG tablet Take 1 mg by mouth daily.    . cyanocobalamin 100 MCG tablet Take 100 mcg by mouth daily.    Marland Kitchen estradiol (ESTRACE) 0.5 MG tablet Take 1 tablet (0.5 mg total) by mouth daily. 90 tablet 3  . fluticasone  (FLONASE) 50 MCG/ACT nasal spray Place 2 sprays into the nose daily as needed.    . lamoTRIgine (LAMICTAL) 200 MG tablet Take 200 mg by mouth daily.    Marland Kitchen levothyroxine (SYNTHROID, LEVOTHROID) 100 MCG tablet Take 100 mcg by mouth daily before breakfast.    . loperamide (IMODIUM) 2 MG capsule Take 2 mg by mouth 4 (four) times daily as needed for diarrhea or loose stools.    Marland Kitchen loratadine (CLARITIN) 10 MG tablet Take 10 mg by mouth daily.    . Lurasidone HCl (LATUDA) 60 MG TABS Take 80 mg by mouth.     Marland Kitchen NEXIUM 40 MG capsule     . progesterone (PROMETRIUM) 100 MG capsule TAKE 1 CAPSULE DAILY 90 capsule 3  . Vitamin D, Ergocalciferol, (DRISDOL) 50000 UNITS CAPS capsule Take 50,000 Units by mouth once a week. Vitamin D    . budesonide (ENTOCORT EC) 3 MG 24 hr capsule      No current facility-administered medications for this visit.    Family History  Problem Relation Age of Onset  . Colon cancer Father 66    Colon  . Hypertension Father   . Breast cancer Maternal Grandmother   . Heart failure Mother   . Osteoarthritis Mother   . Depression Mother   . Hypertension Mother   .  Stroke Mother   . Cancer Paternal Grandmother     skin cancer, nose  . Heart attack Paternal Grandfather   . Cancer Maternal Grandfather     lung    ROS:  Pertinent items are noted in HPI.  Otherwise, a comprehensive ROS was negative.  Exam:   BP 122/78 mmHg  Pulse 84  Resp 16  Ht 5' 6"  (1.676 m)  Wt 241 lb (109.317 kg)  BMI 38.92 kg/m2  LMP 01/31/2002 Height: 5' 6"  (167.6 cm) Ht Readings from Last 3 Encounters:  05/12/15 5' 6"  (1.676 m)  06/17/14 5' 8"  (1.727 m)  04/28/14 5' 7"  (1.702 m)    General appearance: alert, cooperative and appears stated age Head: Normocephalic, without obvious abnormality, atraumatic Neck: no adenopathy, supple, symmetrical, trachea midline and thyroid normal to inspection and palpation Lungs: clear to auscultation bilaterally - no wheezing or rales Breasts: normal  appearance, no masses or tenderness Heart: regular rate and rhythm, no palpitations Abdomen: soft, non-tender; no masses,  no organomegaly Extremities: extremities normal, atraumatic, no cyanosis or edema Skin: Skin color, texture, turgor normal. No rashes or lesions Lymph nodes: Cervical, supraclavicular, and axillary nodes normal. No abnormal inguinal nodes palpated Neurologic: Grossly normal   Pelvic: External genitalia:   Lesions as noted                  Urethra:  normal appearing urethra with no masses, tenderness or lesions              Bartholin's and Skene's: normal                 Vagina: normal appearing vagina with normal color and discharge, no lesions              Cervix: anteverted              Pap taken: Yes.   Bimanual Exam:  Uterus:  normal size, contour, position, consistency, mobility, non-tender              Adnexa: no mass, fullness, tenderness               Rectovaginal: Confirms               Anus:  normal sphincter tone, no lesions  Chaperone present: yes  A:  Well Woman with normal exam  Postmenopausal on HRT since 2004 History of Ulcerative colitis [place]: father with colon cancer History of Bipolar depression   P:   Reviewed health and wellness pertinent to exam  Pap smear as above  Mammogram is due 02/2016  Refill on Estrace and Prometrium for a year  Counseled about risk of DVT, CVA, cancer  She agrees to try and taper HRT in the fall to 1/2 tablet of Estrace until the next AEX  Will have her see Dr. Sabra Heck for biopsy of lesion and discuss the 'Poof' of air counseled on breast self exam, mammography screening, use and side effects of HRT, adequate intake of calcium and vitamin D, diet and exercise, Kegel's exercises return annually or prn  An After Visit Summary was printed and given to the patient.

## 2015-05-13 ENCOUNTER — Telehealth: Payer: Self-pay | Admitting: Nurse Practitioner

## 2015-05-13 ENCOUNTER — Telehealth: Payer: Self-pay

## 2015-05-13 LAB — IPS PAP TEST WITH HPV

## 2015-05-13 NOTE — Telephone Encounter (Signed)
Called patients home and mobile numbers to review benefits for recommended vulvar biopsy. Left voicemail on both numbers to return call for benefits. Benefits listed in guarantor notes. Chart note requests to schedule patient with Dr Sabra Heck.

## 2015-05-13 NOTE — Telephone Encounter (Signed)
Spoke with patient. Patient is scheduled for a vulvar biopsy on 05/19/2015 with Dr.Miller. Advised she does not need to take any medication prior to her appointment as the area will be numbed when she is in the office. She is agreeable and verbalizes understanding.   Routing to provider for final review. Patient agreeable to disposition. Will close encounter.

## 2015-05-13 NOTE — Telephone Encounter (Signed)
I will make a note of this and include it on her chart for Dr. Sabra Heck to see.

## 2015-05-13 NOTE — Telephone Encounter (Signed)
I have notified the patient that this has been noted in her chart for Dr.Miller's review. She is agreeable.  Routing to provider for final review. Patient agreeable to disposition. Will close encounter.

## 2015-05-13 NOTE — Telephone Encounter (Signed)
Spoke with patient regarding benefit for procedure. Patient understood and agreeable. Patient ready to schedule. Patient scheduled for 05/19/15 with Dr Sabra Heck. Patient agreeable to arrival date/time. Patient agreeable to 72 hour cancellation policy with $599 fee. Patient informed to make sure she eats and hydrates well prior to procedure. Patient aware she may take ibuprofen if a tolerated medication. Patient stated she cannot take ibuprofen and asks for alternatives. Routing to triage to discuss with patient. Patient agreeable to call from triage to discuss.

## 2015-05-13 NOTE — Telephone Encounter (Signed)
Telephone encounter created to discuss mychart message with Kem Boroughs, FNP.

## 2015-05-13 NOTE — Telephone Encounter (Signed)
Patient was seen in the office on 05/12/2015 with Kem Boroughs, FNP. Routing [employer] as seen below to Kem Boroughs, FNP for review and advise.  Visit Follow-Up Question  Message [id number]   From  AVERIE HORNBAKER   To  Kem Boroughs, FNP   Sent  05/12/2015 5:24 PM     The "poof" we discussed is from my urethra, not my vagina.      Responsible Party    Pool - [facility] Clinical Pool No one has taken responsibility for this message.     No actions have been taken on this message.

## 2015-05-14 LAB — LIPID PANEL
Cholesterol: 256 — AB (ref 0–200)
HDL: 84 — AB (ref 35–70)
LDL CALC: 173
Triglycerides: 145 (ref 40–160)

## 2015-05-15 NOTE — Progress Notes (Signed)
Reviewed personally.  M. Suzanne Rheta Hemmelgarn, MD.  

## 2015-05-19 ENCOUNTER — Ambulatory Visit (INDEPENDENT_AMBULATORY_CARE_PROVIDER_SITE_OTHER): Payer: BLUE CROSS/BLUE SHIELD | Admitting: Obstetrics & Gynecology

## 2015-05-19 VITALS — BP 132/80 | HR 88 | Resp 18 | Ht 66.0 in | Wt 241.0 lb

## 2015-05-19 DIAGNOSIS — K629 Disease of anus and rectum, unspecified: Secondary | ICD-10-CM

## 2015-05-19 DIAGNOSIS — N9089 Other specified noninflammatory disorders of vulva and perineum: Secondary | ICD-10-CM

## 2015-05-19 NOTE — Progress Notes (Signed)
Subjective:     Patient ID: Jessica Berger, female   DOB: 01-Aug-1955, 60 y.o.   MRN: [id number]  HPI 60 yo G1P1 MWF here for possible vulvar biopsy after pt was seen by Kem Boroughs on 05/12/15.  Pt described a rough red area with "crusty like discharge" on the left perineum.  An erythematous area on the perineum on physical exam was noted.  Pt denies any new symptoms.  Denies vaginal bleeding or vaginal discharge.  Denies pelvic pain.  Review of Systems  All other systems reviewed and are negative.      Objective:   Physical Exam  Constitutional: She appears well-developed and well-nourished.  Genitourinary:     Lymphadenopathy:       Right: No inguinal adenopathy present.       Left: No inguinal adenopathy present.  Skin: Skin is warm.  Psychiatric: She has a normal mood and affect.   Edman Circle was present to confirm area seen today on exam was consistent with finding at her last visit.  She confirmed area I saw today was the same except for the absence of the erythema.  Consent obtained for biopsy.  Area cleansed with Betadine x 3.  0.5cc 1% Lidocaine instilled.  40m punch biopsy obtained.  Tissue removed with sterile pick-ups and scissors.  Silver nitrate used for excellent hemostasis.  Pt tolerated procedure well.  No dressing applied.     Assessment:     Linear, slightly raised lesion on left vulva.  S/p biopsy today.     Plan:     Biopsy will be sent to pathology.  Results will be called to pt with additional recommendations to be made at that time.

## 2015-05-20 DIAGNOSIS — F3162 Bipolar disorder, current episode mixed, moderate: Secondary | ICD-10-CM | POA: Diagnosis not present

## 2015-05-22 ENCOUNTER — Telehealth: Payer: Self-pay | Admitting: Nurse Practitioner

## 2015-05-22 NOTE — Telephone Encounter (Signed)
Call to patient and she is given results from Dr. Sabra Heck.  She states she was having some fluid that was coming from area prior to biopsy and she was concerned.  Advised patient that biopsy was benign and no further testing required. Patient advised if area is not healing well or notices any further skin changes to please call back for evaluation. Patent agreeable.

## 2015-05-22 NOTE — Telephone Encounter (Signed)
-----   Message from Megan Salon, MD sent at 05/22/2015  8:03 AM EDT ----- Please let pt know that the biopsy showed a skin tag.  Nothing else needs to be done.  This is benign and has no cancer risk.

## 2015-05-22 NOTE — Telephone Encounter (Signed)
Patient is calling to see if her biopsy results are in yet.

## 2015-05-26 ENCOUNTER — Encounter: Payer: Self-pay | Admitting: *Deleted

## 2015-06-23 ENCOUNTER — Ambulatory Visit: Payer: BLUE CROSS/BLUE SHIELD | Admitting: Neurology

## 2015-06-23 ENCOUNTER — Ambulatory Visit (INDEPENDENT_AMBULATORY_CARE_PROVIDER_SITE_OTHER): Payer: BLUE CROSS/BLUE SHIELD | Admitting: Nurse Practitioner

## 2015-06-23 ENCOUNTER — Encounter: Payer: Self-pay | Admitting: Nurse Practitioner

## 2015-06-23 VITALS — BP 122/76 | HR 60 | Ht 66.0 in | Wt 247.0 lb

## 2015-06-23 DIAGNOSIS — L720 Epidermal cyst: Secondary | ICD-10-CM

## 2015-06-23 MED ORDER — DOXYCYCLINE HYCLATE 100 MG PO CAPS: ORAL_CAPSULE | ORAL | Status: DC

## 2015-06-23 NOTE — Patient Instructions (Signed)
Epidermal Cyst An epidermal cyst is sometimes called a sebaceous cyst, epidermal inclusion cyst, or infundibular cyst. These cysts usually contain a substance that looks "pasty" or "cheesy" and may have a bad smell. This substance is a protein called keratin. Epidermal cysts are usually found on the face, neck, or trunk. They may also occur in the vaginal area or other parts of the genitalia of both men and women. Epidermal cysts are usually small, painless, slow-growing bumps or lumps that move freely under the skin. It is important not to try to pop them. This may cause an infection and lead to tenderness and swelling. CAUSES  Epidermal cysts may be caused by a deep penetrating injury to the skin or a plugged hair follicle, often associated with acne. SYMPTOMS  Epidermal cysts can become inflamed and cause:  Redness.  Tenderness.  Increased temperature of the skin over the bumps or lumps.  Grayish-white, bad smelling material that drains from the bump or lump. DIAGNOSIS  Epidermal cysts are easily diagnosed by your caregiver during an exam. Rarely, a tissue sample (biopsy) may be taken to rule out other conditions that may resemble epidermal cysts. TREATMENT   Epidermal cysts often get better and disappear on their own. They are rarely ever cancerous.  If a cyst becomes infected, it may become inflamed and tender. This may require opening and draining the cyst. Treatment with antibiotics may be necessary. When the infection is gone, the cyst may be removed with minor surgery.  Small, inflamed cysts can often be treated with antibiotics or by injecting steroid medicines.  Sometimes, epidermal cysts become large and bothersome. If this happens, surgical removal in your caregiver's office may be necessary. HOME CARE INSTRUCTIONS  Only take over-the-counter or prescription medicines as directed by your caregiver.  Take your antibiotics as directed. Finish them even if you start to feel  better. SEEK MEDICAL CARE IF:   Your cyst becomes tender, red, or swollen.  Your condition is not improving or is getting worse.  You have any other questions or concerns. MAKE SURE YOU:  Understand these instructions.  Will watch your condition.  Will get help right away if you are not doing well or get worse.   This information is not intended to replace advice given to you by your health care provider. Make sure you discuss any questions you have with your health care provider.   Document Released: 12/19/2003 Document Revised: 04/11/2011 Document Reviewed: 07/26/2010 Elsevier Interactive Patient Education [employer].

## 2015-06-23 NOTE — Progress Notes (Signed)
60 y.o. Married Caucasian female [id number] here with complaint of cyst at the left groin area. Symptoms occurred over the weekend.  No history of trauma, riding bike, etc.  No discharge but quite painful.  Denies fever and chills.  No change in personal products.   O:  Healthy female WDWN Affect: normal, orientation x 3  Exam: no distress Abdomen: soft and non tender Lymph node: no enlargement or tenderness Left groin with an inclusion cyst about 5 mm in diameter. No discharge.  Area is tender and almost looks bruised.   A: Left inguinal inclusion cyst   P: No culture is obtained as there is no discharge.  She is given Doxycycline BID for a week.  Has allergy to PCN and Sulfa.  She is to apply warm compresses TID prn.  If area gets worse and needs I&D to CB.  Most likely will resolve on its own.   RV prn

## 2015-06-29 NOTE — Progress Notes (Signed)
Encounter reviewed by Dr. Rykker Coviello Amundson C. Silva.  

## 2015-06-30 ENCOUNTER — Telehealth: Payer: Self-pay | Admitting: Nurse Practitioner

## 2015-06-30 NOTE — Telephone Encounter (Signed)
Spoke with patient. Patient was seen on 06/23/2015 with Kem Boroughs, FNP. She was given Doxycycline BID x 1 week for inclusion cyst of left groin. States tonight will be her last dose of Doxycycline. Cyst has gotten smaller and less painful, but is still present. Reports cyst is purple in color. Denies any fevers or chills. "It was the size of a kidney bean and now it is the size of a pea." Asking if she needs to have another dose of antibiotics or what next steps are. Advised I will speak with Kem Boroughs, FNP and return call with further recommendations. She is agreeable.

## 2015-06-30 NOTE — Telephone Encounter (Signed)
Spoke with patient. Advised of message as seen below from [place]. She is agreeable and verbalizes understanding.  Routing to provider for final review. Patient agreeable to disposition. Will close encounter.

## 2015-06-30 NOTE — Telephone Encounter (Signed)
She does not need another course of antibiotics right now.  Have her to do warm compresses to make area go down further.  The likely hood of still infected is less.

## 2015-06-30 NOTE — Telephone Encounter (Signed)
Patient says the cyst has not gone away and wondering if she need another round of antibiotics.

## 2015-07-02 LAB — TSH: TSH: 1.29 (ref <=5.90)

## 2015-07-14 ENCOUNTER — Ambulatory Visit (INDEPENDENT_AMBULATORY_CARE_PROVIDER_SITE_OTHER): Payer: BLUE CROSS/BLUE SHIELD | Admitting: Neurology

## 2015-07-14 ENCOUNTER — Encounter: Payer: Self-pay | Admitting: Neurology

## 2015-07-14 VITALS — BP 128/76 | HR 90 | Resp 16 | Ht 66.0 in | Wt 236.0 lb

## 2015-07-14 DIAGNOSIS — G4733 Obstructive sleep apnea (adult) (pediatric): Secondary | ICD-10-CM | POA: Diagnosis not present

## 2015-07-14 DIAGNOSIS — E669 Obesity, unspecified: Secondary | ICD-10-CM | POA: Diagnosis not present

## 2015-07-14 DIAGNOSIS — Z9989 Dependence on other enabling machines and devices: Principal | ICD-10-CM

## 2015-07-14 NOTE — Patient Instructions (Signed)
Please continue using your CPAP regularly. While your insurance requires that you use CPAP at least 4 hours each night on 70% of the nights, I recommend, that you not skip any nights and use it throughout the night if you can. Getting used to CPAP and staying with the treatment long term does take time and patience and discipline. Untreated obstructive sleep apnea when it is moderate to severe can have an adverse impact on cardiovascular health and raise her risk for heart disease, arrhythmias, hypertension, congestive heart failure, stroke and diabetes. Untreated obstructive sleep apnea causes sleep disruption, nonrestorative sleep, and sleep deprivation. This can have an impact on your day to day functioning and cause daytime sleepiness and impairment of cognitive function, memory loss, mood disturbance, and problems focussing. Using CPAP regularly can improve these symptoms.  Keep up the good work! I will see you back in 12 months for sleep apnea check up.

## 2015-07-14 NOTE — Progress Notes (Signed)
Subjective:    Jessica Berger ID: Jessica Berger is a 60 y.o. female.  HPI     Interim history:   Jessica Berger is a 60 year old right-handed woman with an underlying medical history of ulcerative colitis, obesity and bipolar disorder, who presents for follow-up consultation of Jessica Berger obstructive sleep apnea, on treatment with CPAP. The Jessica Berger is unaccompanied today. I last saw Jessica Berger on 06/17/2014, at which time Jessica Berger reported doing well. Jessica Berger was compliant with CPAP therapy. I suggested a one-year checkup. Jessica Berger was in regular follow-up with Jessica Berger psychiatrist, Dr. Caprice Beaver.  Today, 07/14/2015: I reviewed Jessica Berger CPAP compliance data from 06/13/2015 through 07/12/2015 which is a total of 30 days during which time Jessica Berger used Jessica Berger machine every night with percent used days greater than 4 hours at 97%, indicating excellent compliance with an average usage of 10 hours and 5 minutes, residual AHI 1.5 per hour, leak low for the 95th percentile at 5.1 L/m on a pressure of 8 cm with EPR of 2.   Today, 07/14/2015: Jessica Berger reports doing well. Continues to be compliant with CPAP therapy and continues to indicate good results and good sleep with it. Daughter moved back from [place]. Jessica Berger sees Dr. Caprice Beaver about every 6 weeks. Has some residual depression but overall is doing well with medication Jessica Berger feels. Gets Jessica Berger CPAP supplies regularly. Uses nasal pillows, DME is [facility].  Previously:   I saw Jessica Berger on 12/17/2013, at which time Jessica Berger reported Jessica Berger was sleeping better, Jessica Berger felt better rested. Jessica Berger felt that Jessica Berger mood was better. Jessica Berger was less sleepy during the day. Jessica Berger even felt that Jessica Berger ulcerative colitis was more stable. Jessica Berger blood pressure values had improved as well. Jessica Berger was overall quite pleased with how Jessica Berger was doing. Jessica Berger was compliant with treatment and I encouraged Jessica Berger to continue with CPAP therapy at the current settings.  I reviewed Jessica Berger CPAP compliance data from 05/17/2014 through 06/15/2014 which is a total of 30 days during which  time Jessica Berger used Jessica Berger machine 26 days with percent used days greater than 4 hours at 87%, indicating very good compliance with an average usage for all days of 9 hours and 26 minutes, residual AHI low at 1.5 per hour and leak low with the 95th percentile at 5.7 L/m on a pressure of 8 cm with EPR of 2.   I first met Jessica Berger on 06/20/2013 at the request of Jessica Berger psychiatrist, at which time Jessica Berger reported snoring, excessive daytime somnolence as well as recurrent headaches. Jessica Berger reported leg twitching during the sleep study in 2006 but no obstructive sleep apnea at the time. Jessica Berger did report some weight gain since then. I invited Jessica Berger back for sleep study. Jessica Berger had baseline sleep study on 07/31/2013, followed by a CPAP titration study on 10/01/2013.    Jessica Berger baseline sleep study on 07/31/2013 showed a sleep efficiency of 66.4% with a latency to sleep of 95.5 minutes and wake after sleep onset of 64.5 minutes with moderate sleep fragmentation noted. Jessica Berger had a normal arousal index. Jessica Berger had an increased percentage of stage II sleep, absence of slow-wave sleep and a decreased percentage of REM sleep with a prolonged REM latency. Jessica Berger had no significant PLMS. Jessica Berger had mild to moderate snoring. Total AHI was 17.7 per hour, rising to 58.6 per hour during REM sleep. Average oxygen saturation was 91%, nadir was 78% during REM sleep. Based on Jessica Berger test results of moderate obstructive sleep apnea I asked Jessica Berger to come back for full night CPAP titration study  which was on 10/01/2013. Sleep efficiency was low at 35.9%. Jessica Berger had a prolonged sleep latency and wake after sleep onset was 154.5 minutes with mild sleep fragmentation noted but a long period of wakefulness between 1 AM and 2:30 AM. Jessica Berger had absence of slow-wave and REM sleep. Jessica Berger was titrated with CPAP from 5-8 cm. On the final pressure Jessica Berger AHI was 0 per hour. Despite the limited results with absence of REM sleep and poor sleep consolidation as well as limited sleep noted during the study I  suggested a trial of CPAP. I prescribed CPAP for home use for Jessica Berger.  I reviewed Jessica Berger compliance data from 10/29/2013 through 11/27/2013 which is a total of 30 days during which time Jessica Berger used Jessica Berger machine every night. Percent used days greater than 4 hours was 93%, average usage of 9 hours and 18 minutes, residual AHI at 2.4 per hour, leak low. Pressure at 8 cm with EPR of 2.   I reviewed Jessica Berger compliance data from 11/17/2013 through 12/16/2013 which is a total of 30 days during which time Jessica Berger used Jessica Berger machine every night. Percent used days greater than 4 hours was 97%. Average usage of 10 hours and 7 minutes. Pressure at 8 cm with EPR of 2. Residual AHI at 1.4 per hour, leak at 4.8 for the 95th percentile.  Jessica Berger typical bedtime is reported to be around 1 AM and usual wake time is around 10 to 11 AM. Sleep onset typically occurs within a few minutes. Jessica Berger reports feeling adequately rested upon awakening. Jessica Berger wakes up on an average 2 to 3 times in the middle of the night and has to go to the bathroom 1 to 2 times on a typical night.   Jessica Berger reports excessive daytime somnolence (EDS) and Jessica Berger Epworth Sleepiness Score (ESS) is 11/24 today. Jessica Berger has not fallen asleep while driving. The Jessica Berger has been taking a planned nap, which is usually 2 hours long, after lunch. There is some report of nighttime reflux, with rare nighttime cough experienced. Jessica Berger is known to kick while asleep or before falling asleep. There is family history of OSA in Jessica Berger mother.   Jessica Berger is a restless sleeper and in the morning, the bed is quite disheveled.    Jessica Berger denies cataplexy, sleep paralysis, hypnagogic or hypnopompic hallucinations, or sleep attacks. Jessica Berger does not report any nightmares, dream enactments, or parasomnias, such as sleep talking or sleep walking. Jessica Berger consumes 2 to 3 caffeinated beverages per day, usually in the form of coffee and Oolong tea.    Jessica Berger bedroom is usually dark and cool. There is a TV in the bedroom and usually it is not on  at night.    Jessica Berger Past Medical History Is Significant For: Past Medical History  Diagnosis Date  . Anemia, unspecified 04/18/2012  . Menopause   . Bipolar 2 disorder ([place])   . Hypercholesteremia   . Migraine   . Ischemic colitis ([place]) 2010  . Ulcerative colitis ([place]) 02/01/2012  . Hypothyroidism 02/01/2012    Jessica Berger Past Surgical History Is Significant For: Past Surgical History  Procedure Laterality Date  . Nasal septum surgery      Jessica Berger Family History Is Significant For: Family History  Problem Relation Age of Onset  . Colon cancer Father 62    Colon  . Hypertension Father   . Breast cancer Maternal Grandmother   . Heart failure Mother   . Osteoarthritis Mother   . Depression Mother   . Hypertension Mother   .  Stroke Mother   . Cancer Paternal Grandmother     skin cancer, nose  . Heart attack Paternal Grandfather   . Cancer Maternal Grandfather     lung    Jessica Berger Social History Is Significant For: Social History   Social History  . Marital Status: Married    Spouse Name: N/A  . Number of Children: 1  . Years of Education: BA   Occupational History  . N/A    Social History Main Topics  . Smoking status: Former Smoker    Quit date: 04/25/1991  . Smokeless tobacco: Never Used  . Alcohol Use: 3.6 oz/week    6 Standard drinks or equivalent per week     Comment: alcohol about 6 drinks a week  . Drug Use: No  . Sexual Activity:    Partners: Male    Birth Control/ Protection: Post-menopausal   Other Topics Concern  . None   Social History Narrative   2 cups of coffee a day, occasional tea     Jessica Berger Allergies Are:  Allergies  Allergen Reactions  . Penicillins   . Sulfa Antibiotics   . Xylocaine [Lidocaine]     Doesn't numb  :   Jessica Berger Current Medications Are:  Outpatient Encounter Prescriptions as of 07/14/2015  Medication Sig  . belladonna alk-PHENObarbital (DONNATAL) 16.2 MG tablet Take 1 tablet by mouth as needed.  . budesonide (ENTOCORT EC) 3 MG 24 hr  capsule   . clonazePAM (KLONOPIN) 1 MG tablet Take 0.5 tablets by mouth daily.  . cyanocobalamin 100 MCG tablet Take 100 mcg by mouth daily.  Marland Kitchen doxycycline (VIBRAMYCIN) 100 MG capsule Take BID for 7 days.  Take with food as can cause GI distress.  Marland Kitchen estradiol (ESTRACE) 0.5 MG tablet Take 1 tablet (0.5 mg total) by mouth daily.  . fluticasone (FLONASE) 50 MCG/ACT nasal spray Place 2 sprays into the nose daily as needed.  . lamoTRIgine (LAMICTAL) 200 MG tablet Take 200 mg by mouth daily.  Marland Kitchen loperamide (IMODIUM) 2 MG capsule Take 2 mg by mouth 4 (four) times daily as needed for diarrhea or loose stools.  Marland Kitchen loratadine (CLARITIN) 10 MG tablet Take 10 mg by mouth daily.  . Lurasidone HCl (LATUDA) 60 MG TABS Take 80 mg by mouth.   Marland Kitchen NEXIUM 40 MG capsule   . progesterone (PROMETRIUM) 100 MG capsule TAKE 1 CAPSULE DAILY  . SYNTHROID 112 MCG tablet Take 1 tablet by mouth daily.  . Vitamin D, Ergocalciferol, (DRISDOL) 50000 UNITS CAPS capsule Take 50,000 Units by mouth once a week. Vitamin D   No facility-administered encounter medications on file as of 07/14/2015.  :  Review of Systems:  Out of a complete 14 point review of systems, all are reviewed and negative with the exception of these symptoms as listed below:   Review of Systems  Neurological:       Jessica Berger is here for CPAP f/u. States that Jessica Berger is doing well with CPAP. No new concerns.    Objective:  Neurologic Exam  Physical Exam Physical Examination:   Filed Vitals:   07/14/15 1420  BP: 128/76  Pulse: 90  Resp: 16   General Examination: The Jessica Berger is a very pleasant 60 y.o. female in no acute distress. Jessica Berger appears well-developed and well-nourished and well groomed. Jessica Berger is obese. Jessica Berger is in good spirits today.   HEENT: Normocephalic, atraumatic, pupils are equal, round and reactive to light and accommodation. Funduscopic exam is normal with sharp disc margins noted. Extraocular  tracking is good without limitation to gaze excursion  or nystagmus noted. Normal smooth pursuit is noted. Hearing is grossly intact. Face is symmetric with normal facial animation and normal facial sensation. Speech is clear with no dysarthria noted. There is no hypophonia. There is no lip, neck/head, jaw or voice tremor. Neck is supple with full range of passive and active motion. There are no carotid bruits on auscultation. Oropharynx exam reveals: mild mouth dryness, adequate dental hygiene and moderate airway crowding, due to elongated uvula and tongue. Mallampati is class II. Tongue protrudes centrally and palate elevates symmetrically. Tonsils are small. Jessica Berger has a Mild overbite.   Chest: Clear to auscultation without wheezing, rhonchi or crackles noted.  Heart: S1+S2+0, regular and normal without murmurs, rubs or gallops noted.   Abdomen: Soft, non-tender and non-distended with normal bowel sounds appreciated on auscultation.  Extremities: There is no pitting edema in the distal lower extremities bilaterally. Pedal pulses are intact.  Skin: Warm and dry without trophic changes noted. There are no varicose veins, has bruising across forearms, history of purpura, Jessica Berger says.  Musculoskeletal: exam reveals no obvious joint deformities, tenderness or joint swelling or erythema.   Neurologically:  Mental status: The Jessica Berger is awake, alert and oriented in all 4 spheres. Jessica Berger immediate and remote memory, attention, language skills and fund of knowledge are appropriate. There is no evidence of aphasia, agnosia, apraxia or anomia. Speech is clear with normal prosody and enunciation. Thought process is linear. Mood is normal and affect is normal.  Cranial nerves II - XII are as described above under HEENT exam. In addition: shoulder shrug is normal with equal shoulder height noted. Motor exam: Normal bulk, strength and tone is noted. There is no drift, tremor or rebound. Romberg is negative. Reflexes are 2+ throughout. Babinski: Toes are flexor bilaterally.  Fine motor skills and coordination: intact with normal finger taps, normal hand movements, normal rapid alternating patting, normal foot taps and normal foot agility.  Cerebellar testing: No dysmetria or intention tremor on finger to nose testing. Heel to shin is unremarkable bilaterally. There is no truncal or gait ataxia.  Sensory exam: intact to light touch in the upper and lower extremities.  Gait, station and balance: Jessica Berger stands easily. No veering to one side is noted. No leaning to one side is noted. Posture is age-appropriate and stance is narrow based. Gait shows normal stride length and normal pace. No problems turning are noted. Tandem walk is unremarkable.  Assessment and Plan:   In summary, JANITH NIELSON is a very pleasant 60 year old female with an underlying medical history of ulcerative colitis, obesity and bipolar disorder, who presents for follow-up consultation of Jessica Berger obstructive sleep apnea, established on treatment with CPAP with full compliance. Jessica Berger indicates ongoing good results with CPAP therapy. Jessica Berger is advised about Jessica Berger recent compliance data and is congratulated on Jessica Berger treatment adherence. Jessica Berger reported improvement in Jessica Berger mood disorder, Jessica Berger daytime somnolence, morning headaches and nocturia. Jessica Berger follows with Dr. Caprice Beaver for Jessica Berger mood disorder on a regular basis and is stable on medication at this time. Physical exam and neurological exam are nonfocal. Weight has been fluctuating and Jessica Berger is encouraged to try to pursue weight loss. Jessica Berger has had an adjustment in Jessica Berger Synthroid from 100 g to 112 g recently. Had blood work through primary care which was unremarkable per Jessica Berger report. Jessica Berger is trying to eat better and it helped to not skip Jessica Berger midday meal. I encouraged Jessica Berger to continue with full CPAP  compliance and Jessica Berger is commended for this. I would like to see Jessica Berger back in one year. I answered all Jessica Berger questions today and the Jessica Berger was in agreement. I encouraged Jessica Berger to call with any  interim questions, concerns, problems or updates.  I spent 25 minutes in total face-to-face time with the Jessica Berger, more than 50% of which was spent in counseling and coordination of care, reviewing test results, reviewing medication and discussing or reviewing the diagnosis of OSA, its prognosis and treatment options.

## 2015-07-22 DIAGNOSIS — F3162 Bipolar disorder, current episode mixed, moderate: Secondary | ICD-10-CM | POA: Diagnosis not present

## 2015-09-24 ENCOUNTER — Telehealth: Payer: Self-pay | Admitting: Nurse Practitioner

## 2015-09-24 NOTE — Telephone Encounter (Signed)
Patient has a purple bruise on breast.

## 2015-09-24 NOTE — Telephone Encounter (Signed)
Spoke with patient. Patient states that she has a purple bruise on her left breast that is the size of a 50 cent piece. First noticed this two days ago. Denies any swelling or redness to the area. Area is not painful. Denies any recent trauma to breast or possibility for bug bite. Advised she will need to be seen in the office for breast check. She is agreeable. Appointment scheduled for tomorrow 09/25/2015 at 1:45 pm with Kem Boroughs, FNP. She is agreeable to date and time.  Routing to provider for final review. Patient agreeable to disposition. Will close encounter.

## 2015-09-25 ENCOUNTER — Encounter: Payer: Self-pay | Admitting: Nurse Practitioner

## 2015-09-25 ENCOUNTER — Ambulatory Visit (INDEPENDENT_AMBULATORY_CARE_PROVIDER_SITE_OTHER): Payer: BLUE CROSS/BLUE SHIELD | Admitting: Nurse Practitioner

## 2015-09-25 VITALS — BP 124/76 | HR 80 | Ht 66.0 in | Wt 240.0 lb

## 2015-09-25 DIAGNOSIS — S2000XA Contusion of breast, unspecified breast, initial encounter: Secondary | ICD-10-CM

## 2015-09-25 DIAGNOSIS — N6489 Other specified disorders of breast: Secondary | ICD-10-CM | POA: Diagnosis not present

## 2015-09-25 NOTE — Progress Notes (Signed)
Reviewed personally.  M. Suzanne Nieves Barberi, MD.  

## 2015-09-25 NOTE — Patient Instructions (Signed)
If bruise on left breast is not gone in 2 weeks please call back.  We may then get Ultrasound of left breast.
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
## 2015-10-20 DIAGNOSIS — F3162 Bipolar disorder, current episode mixed, moderate: Secondary | ICD-10-CM | POA: Diagnosis not present

## 2015-11-26 ENCOUNTER — Telehealth: Payer: Self-pay | Admitting: Nurse Practitioner

## 2015-11-26 DIAGNOSIS — D692 Other nonthrombocytopenic purpura: Secondary | ICD-10-CM | POA: Diagnosis not present

## 2015-11-26 LAB — CBC AND DIFFERENTIAL
HCT: 44 (ref 36–46)
Hemoglobin: 14.8 (ref 12.0–16.0)
Platelets: 247 (ref 150–399)
WBC: 7.7

## 2015-11-26 LAB — POCT ERYTHROCYTE SEDIMENTATION RATE, NON-AUTOMATED: SED RATE: 5

## 2015-11-26 NOTE — Telephone Encounter (Signed)
Spoke with patient. Patient states that she noticed a lump on the right side of her vulva yesterday. Reports that the area is tender to the touch and has grown in size today. Lump is the size of a nickel. States the area around the lump is red. Lump has white fluid in it. Patient has been using warm compresses. Denies fever or chills. Advised she will need to be seen in the office for further evaluation. Requesting an appointment tomorrow afternoon. Appointment scheduled for 11/27/2015 at 3 pm with Kem Boroughs, FNP. Patient is agreeable to date and time. Advised if symptoms worsen or if she develops new symptoms she will need to be seen for further evaluation in the office or local ER. Patient is agreeable.  Routing to provider for final review. Patient agreeable to disposition. Will close encounter.

## 2015-11-26 NOTE — Telephone Encounter (Signed)
Patient says she has a seb cyst on vagina.

## 2015-11-27 ENCOUNTER — Ambulatory Visit (INDEPENDENT_AMBULATORY_CARE_PROVIDER_SITE_OTHER): Payer: BLUE CROSS/BLUE SHIELD | Admitting: Nurse Practitioner

## 2015-11-27 ENCOUNTER — Encounter: Payer: Self-pay | Admitting: Nurse Practitioner

## 2015-11-27 VITALS — BP 134/72 | HR 72 | Temp 98.2°F | Ht 66.0 in | Wt 236.0 lb

## 2015-11-27 DIAGNOSIS — N907 Vulvar cyst: Secondary | ICD-10-CM | POA: Diagnosis not present

## 2015-11-27 MED ORDER — TRIAMCINOLONE ACETONIDE 0.025 % EX OINT: 1.0000 "application " | TOPICAL_OINTMENT | Freq: Two times a day (BID) | CUTANEOUS | 0 refills | Status: DC

## 2015-11-27 MED ORDER — DOXYCYCLINE HYCLATE 100 MG PO CAPS: ORAL_CAPSULE | ORAL | 0 refills | Status: DC

## 2015-11-27 MED ORDER — NYSTATIN 100000 UNIT/GM EX CREA: 1.0000 "application " | TOPICAL_CREAM | Freq: Two times a day (BID) | CUTANEOUS | 0 refills | Status: DC

## 2015-11-27 MED ORDER — FLUCONAZOLE 150 MG PO TABS: 150.0000 mg | ORAL_TABLET | Freq: Once | ORAL | 0 refills | Status: AC

## 2015-11-27 NOTE — Progress Notes (Signed)
60 y.o. Married Caucasian female G1P1001 here with complaint of vulvar symptoms of a painful bump along the right vulva area.  It came up Wednesday and became sore fairly quickly.  The size was about nickel size.  She has had similar places before and did apply warm compresses.  Area is better today. Denies new personal products or vaginal dryness. No STD concerns. Urinary symptoms none .  The person who does her waxing has told her recently about areas in the upper groin below the abdominal folds that are red as well.  sometimes they feel uncomfortable.   O:  Healthy female WDWN Affect: normal, orientation x 3  Exam: no distress Abdomen:  Soft and non tender Lymph node: no enlargement or tenderness Pelvic exam: External genital: normal female with several inclusion cyst.  The one that is tender in on the right without exudate but tender.  At the upper groin and lower abdomen there are multiple areas that look like yeast with scalyness and linear cuts. BUS: negative    A: Inclusion cyst   Fungal topical infection of the groin   P: Discussed findings of epidermal cyst and was able to express 2 others.  Rx: Doxycycline 100 mg BID for 3-5 days until symptoms resolve - if worsens to call on Monday  Diflucan 150 mg X 2   Triamcinolone and Nystatin cream topically for the yeast - avoid get waxing done until this is healed.    RV prn

## 2015-11-27 NOTE — Patient Instructions (Signed)
Epidermal Cyst An epidermal cyst is sometimes called a sebaceous cyst, epidermal inclusion cyst, or infundibular cyst. These cysts usually contain a substance that looks "pasty" or "cheesy" and may have a bad smell. This substance is a protein called keratin. Epidermal cysts are usually found on the face, neck, or trunk. They may also occur in the vaginal area or other parts of the genitalia of both men and women. Epidermal cysts are usually small, painless, slow-growing bumps or lumps that move freely under the skin. It is important not to try to pop them. This may cause an infection and lead to tenderness and swelling. CAUSES  Epidermal cysts may be caused by a deep penetrating injury to the skin or a plugged hair follicle, often associated with acne. SYMPTOMS  Epidermal cysts can become inflamed and cause:  Redness.  Tenderness.  Increased temperature of the skin over the bumps or lumps.  Grayish-white, bad smelling material that drains from the bump or lump. DIAGNOSIS  Epidermal cysts are easily diagnosed by your caregiver during an exam. Rarely, a tissue sample (biopsy) may be taken to rule out other conditions that may resemble epidermal cysts. TREATMENT   Epidermal cysts often get better and disappear on their own. They are rarely ever cancerous.  If a cyst becomes infected, it may become inflamed and tender. This may require opening and draining the cyst. Treatment with antibiotics may be necessary. When the infection is gone, the cyst may be removed with minor surgery.  Small, inflamed cysts can often be treated with antibiotics or by injecting steroid medicines.  Sometimes, epidermal cysts become large and bothersome. If this happens, surgical removal in your caregiver's office may be necessary. HOME CARE INSTRUCTIONS  Only take over-the-counter or prescription medicines as directed by your caregiver.  Take your antibiotics as directed. Finish them even if you start to feel  better. SEEK MEDICAL CARE IF:   Your cyst becomes tender, red, or swollen.  Your condition is not improving or is getting worse.  You have any other questions or concerns. MAKE SURE YOU:  Understand these instructions.  Will watch your condition.  Will get help right away if you are not doing well or get worse.   This information is not intended to replace advice given to you by your health care provider. Make sure you discuss any questions you have with your health care provider.   Document Released: 12/19/2003 Document Revised: 04/11/2011 Document Reviewed: 07/26/2010 Elsevier Interactive Patient Education [employer].

## 2015-11-29 NOTE — Progress Notes (Signed)
Encounter reviewed by Dr. Aundria Rud. Would consider stopping HRT due to age and hx ischemic colitis.

## 2015-11-30 DIAGNOSIS — G4733 Obstructive sleep apnea (adult) (pediatric): Secondary | ICD-10-CM | POA: Diagnosis not present

## 2015-12-02 DIAGNOSIS — G4733 Obstructive sleep apnea (adult) (pediatric): Secondary | ICD-10-CM | POA: Diagnosis not present

## 2015-12-07 DIAGNOSIS — H2513 Age-related nuclear cataract, bilateral: Secondary | ICD-10-CM | POA: Diagnosis not present

## 2015-12-07 DIAGNOSIS — H5203 Hypermetropia, bilateral: Secondary | ICD-10-CM | POA: Diagnosis not present

## 2015-12-14 DIAGNOSIS — F3162 Bipolar disorder, current episode mixed, moderate: Secondary | ICD-10-CM | POA: Diagnosis not present

## 2016-01-12 DIAGNOSIS — Z800 Family history of malignant neoplasm of digestive organs: Secondary | ICD-10-CM | POA: Diagnosis not present

## 2016-01-12 DIAGNOSIS — K219 Gastro-esophageal reflux disease without esophagitis: Secondary | ICD-10-CM | POA: Diagnosis not present

## 2016-01-12 DIAGNOSIS — K760 Fatty (change of) liver, not elsewhere classified: Secondary | ICD-10-CM | POA: Diagnosis not present

## 2016-01-12 DIAGNOSIS — K449 Diaphragmatic hernia without obstruction or gangrene: Secondary | ICD-10-CM | POA: Diagnosis not present

## 2016-01-24 DIAGNOSIS — R509 Fever, unspecified: Secondary | ICD-10-CM | POA: Diagnosis not present

## 2016-01-24 DIAGNOSIS — J069 Acute upper respiratory infection, unspecified: Secondary | ICD-10-CM | POA: Diagnosis not present

## 2016-01-29 ENCOUNTER — Other Ambulatory Visit: Payer: Self-pay | Admitting: Gastroenterology

## 2016-01-29 DIAGNOSIS — Z79899 Other long term (current) drug therapy: Secondary | ICD-10-CM

## 2016-02-04 ENCOUNTER — Other Ambulatory Visit: Payer: Self-pay | Admitting: Nurse Practitioner

## 2016-02-04 DIAGNOSIS — Z1231 Encounter for screening mammogram for malignant neoplasm of breast: Secondary | ICD-10-CM

## 2016-02-10 ENCOUNTER — Ambulatory Visit
Admission: RE | Admit: 2016-02-10 | Discharge: 2016-02-10 | Disposition: A | Discharge status: Home / Self Care | Payer: BLUE CROSS/BLUE SHIELD | Source: Ambulatory Visit | Attending: Gastroenterology | Admitting: Gastroenterology

## 2016-02-10 DIAGNOSIS — Z79899 Other long term (current) drug therapy: Secondary | ICD-10-CM

## 2016-02-10 DIAGNOSIS — Z13820 Encounter for screening for osteoporosis: Secondary | ICD-10-CM | POA: Diagnosis not present

## 2016-02-10 DIAGNOSIS — Z780 Asymptomatic menopausal state: Secondary | ICD-10-CM | POA: Diagnosis not present

## 2016-03-01 DIAGNOSIS — H2513 Age-related nuclear cataract, bilateral: Secondary | ICD-10-CM | POA: Diagnosis not present

## 2016-03-01 DIAGNOSIS — H2512 Age-related nuclear cataract, left eye: Secondary | ICD-10-CM | POA: Diagnosis not present

## 2016-03-03 ENCOUNTER — Ambulatory Visit
Admission: RE | Admit: 2016-03-03 | Discharge: 2016-03-03 | Disposition: A | Discharge status: Home / Self Care | Payer: BLUE CROSS/BLUE SHIELD | Source: Ambulatory Visit | Attending: Nurse Practitioner | Admitting: Nurse Practitioner

## 2016-03-03 DIAGNOSIS — Z1231 Encounter for screening mammogram for malignant neoplasm of breast: Secondary | ICD-10-CM

## 2016-03-04 DIAGNOSIS — G4733 Obstructive sleep apnea (adult) (pediatric): Secondary | ICD-10-CM | POA: Diagnosis not present

## 2016-03-14 HISTORY — PX: CATARACT EXTRACTION W/ INTRAOCULAR LENS IMPLANT: SHX1309

## 2016-03-15 DIAGNOSIS — F3162 Bipolar disorder, current episode mixed, moderate: Secondary | ICD-10-CM | POA: Diagnosis not present

## 2016-03-21 DIAGNOSIS — H2512 Age-related nuclear cataract, left eye: Secondary | ICD-10-CM | POA: Diagnosis not present

## 2016-03-21 DIAGNOSIS — Z961 Presence of intraocular lens: Secondary | ICD-10-CM | POA: Diagnosis not present

## 2016-03-21 DIAGNOSIS — H2513 Age-related nuclear cataract, bilateral: Secondary | ICD-10-CM | POA: Diagnosis not present

## 2016-03-22 DIAGNOSIS — Z961 Presence of intraocular lens: Secondary | ICD-10-CM | POA: Diagnosis not present

## 2016-03-22 DIAGNOSIS — H2513 Age-related nuclear cataract, bilateral: Secondary | ICD-10-CM | POA: Diagnosis not present

## 2016-03-22 DIAGNOSIS — H2511 Age-related nuclear cataract, right eye: Secondary | ICD-10-CM | POA: Diagnosis not present

## 2016-04-11 DIAGNOSIS — H2511 Age-related nuclear cataract, right eye: Secondary | ICD-10-CM | POA: Diagnosis not present

## 2016-04-18 HISTORY — PX: CATARACT EXTRACTION W/ INTRAOCULAR LENS IMPLANT: SHX1309

## 2016-05-06 ENCOUNTER — Other Ambulatory Visit: Payer: Self-pay | Admitting: Nurse Practitioner

## 2016-05-06 DIAGNOSIS — Z79890 Hormone replacement therapy (postmenopausal): Secondary | ICD-10-CM

## 2016-05-06 NOTE — Telephone Encounter (Signed)
Medication refill request: Estradiol Last AEX:  05/12/15 PG Next AEX: 05/13/16 PG Last MMG (if hormonal medication request): 03/03/16 BIRADS1, Density B, TBC Refill authorized: 05/12/15 #90 3R. Please advise. Thank you.   Medication refill request: Progesterone Last AEX:  05/12/15 PG Next AEX: 05/13/16 PG Last MMG (if hormonal medication request): 03/03/16 BIRADS1, Density B, TBC Refill authorized: 05/12/15 #90 3R. Please advise. Thank you.   Routed to DL since PG is out of the office

## 2016-05-10 DIAGNOSIS — M545 Low back pain: Secondary | ICD-10-CM | POA: Diagnosis not present

## 2016-05-13 ENCOUNTER — Ambulatory Visit (INDEPENDENT_AMBULATORY_CARE_PROVIDER_SITE_OTHER): Payer: BLUE CROSS/BLUE SHIELD | Admitting: Nurse Practitioner

## 2016-05-13 ENCOUNTER — Encounter: Payer: Self-pay | Admitting: Nurse Practitioner

## 2016-05-13 VITALS — BP 122/74 | HR 72 | Ht 66.0 in | Wt 234.0 lb

## 2016-05-13 DIAGNOSIS — Z0000 Encounter for general adult medical examination without abnormal findings: Secondary | ICD-10-CM

## 2016-05-13 DIAGNOSIS — Z79890 Hormone replacement therapy (postmenopausal): Secondary | ICD-10-CM

## 2016-05-13 DIAGNOSIS — Z01419 Encounter for gynecological examination (general) (routine) without abnormal findings: Secondary | ICD-10-CM | POA: Diagnosis not present

## 2016-05-13 MED ORDER — PROGESTERONE MICRONIZED 100 MG PO CAPS: 100.0000 mg | ORAL_CAPSULE | Freq: Every day | ORAL | 4 refills | Status: DC

## 2016-05-13 MED ORDER — ESTRADIOL 0.5 MG PO TABS: 0.5000 mg | ORAL_TABLET | Freq: Every day | ORAL | 4 refills | Status: DC

## 2016-05-13 NOTE — Patient Instructions (Addendum)

## 2016-05-13 NOTE — Progress Notes (Signed)
61 y.o. G59P1001 Married  Caucasian Fe here for annual exam. She is not doing so well since her mother passed away yesterday.  Mother had been in the hospital following a rib fracture.  She then developed pneumonia and secondary infection.    Seen by Dr. Sabra Heck for left perineal biopsy that was a benign skin tag. Then seen for a cyst left groin that was treated with Doxycycline 5/17. Again 10/17 with cyst on the right groin and yeast.  Bilateral cataracts were done in Feb/ March.  Patient's last menstrual period was 01/31/2002 (approximate).          Sexually active: Yes.    The current method of family planning is vasectomy.    Exercising: has been traveling to take care of sick mother and has been doing strength training DVD, hopes to increase soon Smoker:  no  Health Maintenance: Pap: 05/12/15, Negative with neg HR HPV  04/24/12, Negative with neg HR HPV MMG: 03/03/16, Bi-Rads 1:  Negative Colonoscopy: 01/12/12, repeat 5 years BMD: 02/10/16 T Score: 1.3 Spine / 0.6 Right Femur Neck / 0.3 Left Femur Neck TDaP: PCP Hep C and HIV: 05/12/15 Labs: PCP takes care of all labs   reports that she quit smoking about 25 years ago. She has never used smokeless tobacco. She reports that she drinks about 3.6 oz of alcohol per week . She reports that she does not use drugs.  Past Medical History:  Diagnosis Date  . Anemia, unspecified 04/18/2012  . Bipolar 2 disorder ([place])   . Hypercholesteremia   . Hypothyroidism 02/01/2012  . Ischemic colitis ([place]) 2010  . Menopause   . Migraine   . Ulcerative colitis ([place]) 02/01/2012    Past Surgical History:  Procedure Laterality Date  . CATARACT EXTRACTION W/ INTRAOCULAR LENS IMPLANT Left 03/14/2016  . CATARACT EXTRACTION W/ INTRAOCULAR LENS IMPLANT Right 04/18/2016  . NASAL SEPTUM SURGERY      Current Outpatient Prescriptions  Medication Sig Dispense Refill  . belladonna alk-PHENObarbital (DONNATAL) 16.2 MG tablet Take 1 tablet by mouth as needed.    .  budesonide (ENTOCORT EC) 3 MG 24 hr capsule     . clonazePAM (KLONOPIN) 0.5 MG tablet Take 1 tablet by mouth daily.    . cyanocobalamin 100 MCG tablet Take 100 mcg by mouth daily.    . cyclobenzaprine (FLEXERIL) 5 MG tablet Take 1 tablet by mouth daily as needed.    Marland Kitchen estradiol (ESTRACE) 0.5 MG tablet Take 1 tablet (0.5 mg total) by mouth daily. 90 tablet 4  . fluticasone (FLONASE) 50 MCG/ACT nasal spray Place 2 sprays into the nose daily as needed.    . lamoTRIgine (LAMICTAL) 200 MG tablet Take 200 mg by mouth daily.    Marland Kitchen LATUDA 80 MG TABS tablet Take 1 tablet by mouth daily.    Marland Kitchen loperamide (IMODIUM) 2 MG capsule Take 2 mg by mouth 4 (four) times daily as needed for diarrhea or loose stools.    Marland Kitchen loratadine (CLARITIN) 10 MG tablet Take 10 mg by mouth daily.    Marland Kitchen NEXIUM 40 MG capsule     . progesterone (PROMETRIUM) 100 MG capsule Take 1 capsule (100 mg total) by mouth daily. 90 capsule 4  . propranolol (INDERAL) 20 MG tablet Take 1 tablet by mouth daily as needed for anxiety.    . SYNTHROID 112 MCG tablet Take 1 tablet by mouth daily.    . Vitamin D, Ergocalciferol, (DRISDOL) 50000 UNITS CAPS capsule Take 50,000 Units by mouth  once a week. Vitamin D     No current facility-administered medications for this visit.     Family History  Problem Relation Age of Onset  . Colon cancer Father 51    Colon  . Hypertension Father   . Breast cancer Maternal Grandmother   . Heart failure Mother   . Osteoarthritis Mother   . Depression Mother   . Hypertension Mother   . Stroke Mother   . Cancer Paternal Grandmother     skin cancer, nose  . Heart attack Paternal Grandfather   . Cancer Maternal Grandfather     lung    ROS:  Pertinent items are noted in HPI.  Otherwise, a comprehensive ROS was negative.  Exam:   BP 122/74 (BP Location: Right Arm, Patient Position: Sitting, Cuff Size: Large)   Pulse 72   Ht 5' 6" (1.676 m)   Wt 234 lb (106.1 kg)   LMP 01/31/2002 (Approximate)   BMI 37.77  kg/m  Height: 5' 6" (167.6 cm) Ht Readings from Last 3 Encounters:  05/13/16 5' 6" (1.676 m)  11/27/15 5' 6" (1.676 m)  09/25/15 5' 6" (1.676 m)    General appearance: alert, cooperative and appears stated age Head: Normocephalic, without obvious abnormality, atraumatic Neck: no adenopathy, supple, symmetrical, trachea midline and thyroid normal to inspection and palpation Lungs: clear to auscultation bilaterally Breasts: normal appearance, no masses or tenderness Heart: regular rate and rhythm Abdomen: soft, non-tender; no masses,  no organomegaly Extremities: extremities normal, atraumatic, no cyanosis or edema Skin: Skin color, texture, turgor normal. No rashes or lesions Lymph nodes: Cervical, supraclavicular, and axillary nodes normal. No abnormal inguinal nodes palpated Neurologic: Grossly normal   Pelvic: External genitalia:  Few area of inclusion cyst that are expressed               Urethra:  normal appearing urethra with no masses, tenderness or lesions              Bartholin's and Skene's: normal                 Vagina: normal appearing vagina with normal color and discharge, no lesions              Cervix: anteverted              Pap taken: No. Bimanual Exam:  Uterus:  normal size, contour, position, consistency, mobility, non-tender              Adnexa: no mass, fullness, tenderness               Rectovaginal: Confirms               Anus:  normal sphincter tone, no lesions  Chaperone present: yes  A:  Well Woman with normal exam  Postmenopausal on HRT since 2004 History of Ulcerative colitis [place]: father with colon cancer History of Bipolar depression  Grief reaction with mother's passing yesterday  History of inclusion cyst perineum   P:   Reviewed health and wellness pertinent to exam  Pap smear not done  Mammogram is due 03/2017  Refill on HRT - discussed use and will try to reduce to 1/2 tablet by end of summer.   requested that she not make any changes now due to her anxiety level.  Counseled with risk of DVT,CVA, cancer, etc.  Counseled on breast self exam, mammography screening, use and side effects of HRT, adequate intake of calcium and vitamin D, diet and  exercise return annually or prn  An After Visit Summary was printed and given to the patient.

## 2016-05-15 NOTE — Progress Notes (Signed)
Encounter reviewed by Dr. Aundria Rud. I support discontinuation of HRT.

## 2016-06-06 DIAGNOSIS — G4733 Obstructive sleep apnea (adult) (pediatric): Secondary | ICD-10-CM | POA: Diagnosis not present

## 2016-07-11 DIAGNOSIS — F3162 Bipolar disorder, current episode mixed, moderate: Secondary | ICD-10-CM | POA: Diagnosis not present

## 2016-07-12 ENCOUNTER — Encounter: Payer: Self-pay | Admitting: Neurology

## 2016-07-13 ENCOUNTER — Ambulatory Visit (INDEPENDENT_AMBULATORY_CARE_PROVIDER_SITE_OTHER): Payer: BLUE CROSS/BLUE SHIELD | Admitting: Neurology

## 2016-07-13 ENCOUNTER — Encounter: Payer: Self-pay | Admitting: Neurology

## 2016-07-13 VITALS — BP 133/74 | HR 67 | Ht 67.0 in | Wt 236.0 lb

## 2016-07-13 DIAGNOSIS — Z9989 Dependence on other enabling machines and devices: Secondary | ICD-10-CM

## 2016-07-13 DIAGNOSIS — G4733 Obstructive sleep apnea (adult) (pediatric): Secondary | ICD-10-CM

## 2016-07-13 NOTE — Patient Instructions (Addendum)
Please continue using your CPAP regularly. While your insurance requires that you use CPAP at least 4 hours each night on 70% of the nights, I recommend, that you not skip any nights and use it throughout the night if you can. Getting used to CPAP and staying with the treatment long term does take time and patience and discipline. Untreated obstructive sleep apnea when it is moderate to severe can have an adverse impact on cardiovascular health and raise her risk for heart disease, arrhythmias, hypertension, congestive heart failure, stroke and diabetes. Untreated obstructive sleep apnea causes sleep disruption, nonrestorative sleep, and sleep deprivation. This can have an impact on your day to day functioning and cause daytime sleepiness and impairment of cognitive function, memory loss, mood disturbance, and problems focussing. Using CPAP regularly can improve these symptoms.  Keep up the good work! We can see you in 1 year, you can see one of our nurse practitioners as you are stable. I will see you after that.

## 2016-07-13 NOTE — Progress Notes (Signed)
Subjective:    Patient ID: Jessica Berger is a 61 y.o. female.  HPI     Interim history:   Jessica Berger is a 61 year old right-handed woman with an underlying medical history of ulcerative colitis, obesity and bipolar disorder, who presents for follow-up consultation of her obstructive sleep apnea, on treatment with CPAP. The patient is unaccompanied today. I last saw her on 07/14/2015, at which time she reported doing well. She was compliant with CPAP therapy. She indicated ongoing good results, she was followed by psychiatry on a regular basis. Her daughter moved back from [place].  Today, 07/13/2016 (all dictated new, as well as above notes, some dictation done in note pad or Word, outside of chart, may appear as copied):   I reviewed her CPAP compliance data from 06/13/2016 through 07/12/2016, which is a total of 30 days, during which time she used her CPAP 29 days with percent used days greater than 4 hours at 97%, indicating excellent compliance with an average usage of 10 hours and 11 minutes which is high. Average AHI was 1.1 per hour, leaked low with the 95th percentile at 2.6 L/m on a pressure of 8 cm with EPR of 2. She reports doing okay, seen Pauline Good, NP, since Dr. Caprice Beaver left. She has had worsening bruising, mostly arms and hands. Has seen dermatologist for this before. She bruised both shins some 2 weeks ago, feel forward, while climbing onto the stairs of a bus.  She is up to date with her supplies. Her mother passed away on May 21, 2016. She is the executor. Has a sister and a brother, one brother passed away. She is the oldest sibling.   The patient's allergies, current medications, family history, past medical history, past social history, past surgical history and problem list were reviewed and updated as appropriate.   Previously (copied from previous notes for reference):   I saw her on 06/17/2014, at which time she reported doing well. She was compliant with CPAP  therapy. I suggested a one-year checkup. She was in regular follow-up with her psychiatrist, Dr. Caprice Beaver.   I reviewed her CPAP compliance data from 06/13/2015 through 07/12/2015 which is a total of 30 days during which time she used her machine every night with percent used days greater than 4 hours at 97%, indicating excellent compliance with an average usage of 10 hours and 5 minutes, residual AHI 1.5 per hour, leak low for the 95th percentile at 5.1 L/m on a pressure of 8 cm with EPR of 2.    I saw her on 12/17/2013, at which time she reported she was sleeping better, she felt better rested. She felt that her mood was better. She was less sleepy during the day. She even felt that her ulcerative colitis was more stable. Her blood pressure values had improved as well. She was overall quite pleased with how she was doing. She was compliant with treatment and I encouraged her to continue with CPAP therapy at the current settings.   I reviewed her CPAP compliance data from 05/17/2014 through 06/15/2014 which is a total of 30 days during which time she used her machine 26 days with percent used days greater than 4 hours at 87%, indicating very good compliance with an average usage for all days of 9 hours and 26 minutes, residual AHI low at 1.5 per hour and leak low with the 95th percentile at 5.7 L/m on a pressure of 8 cm with EPR of 2.    I first  met her on 06/20/2013 at the request of her psychiatrist, at which time she reported snoring, excessive daytime somnolence as well as recurrent headaches. She reported leg twitching during the sleep study in 2006 but no obstructive sleep apnea at the time. She did report some weight gain since then. I invited her back for sleep study. She had baseline sleep study on 07/31/2013, followed by a CPAP titration study on 10/01/2013.    Her baseline sleep study on 07/31/2013 showed a sleep efficiency of 66.4% with a latency to sleep of 95.5 minutes and wake after sleep  onset of 64.5 minutes with moderate sleep fragmentation noted. She had a normal arousal index. She had an increased percentage of stage II sleep, absence of slow-wave sleep and a decreased percentage of REM sleep with a prolonged REM latency. She had no significant PLMS. She had mild to moderate snoring. Total AHI was 17.7 per hour, rising to 58.6 per hour during REM sleep. Average oxygen saturation was 91%, nadir was 78% during REM sleep. Based on her test results of moderate obstructive sleep apnea I asked her to come back for full night CPAP titration study which was on 10/01/2013. Sleep efficiency was low at 35.9%. She had a prolonged sleep latency and wake after sleep onset was 154.5 minutes with mild sleep fragmentation noted but a long period of wakefulness between 1 AM and 2:30 AM. She had absence of slow-wave and REM sleep. She was titrated with CPAP from 5-8 cm. On the final pressure her AHI was 0 per hour. Despite the limited results with absence of REM sleep and poor sleep consolidation as well as limited sleep noted during the study I suggested a trial of CPAP. I prescribed CPAP for home use for her.   I reviewed her compliance data from 10/29/2013 through 11/27/2013 which is a total of 30 days during which time she used her machine every night. Percent used days greater than 4 hours was 93%, average usage of 9 hours and 18 minutes, residual AHI at 2.4 per hour, leak low. Pressure at 8 cm with EPR of 2.    I reviewed her compliance data from 11/17/2013 through 12/16/2013 which is a total of 30 days during which time she used her machine every night. Percent used days greater than 4 hours was 97%. Average usage of 10 hours and 7 minutes. Pressure at 8 cm with EPR of 2. Residual AHI at 1.4 per hour, leak at 4.8 for the 95th percentile.   Her typical bedtime is reported to be around 1 AM and usual wake time is around 10 to 11 AM. Sleep onset typically occurs within a few minutes. She reports feeling  adequately rested upon awakening. She wakes up on an average 2 to 3 times in the middle of the night and has to go to the bathroom 1 to 2 times on a typical night.   She reports excessive daytime somnolence (EDS) and Her Epworth Sleepiness Score (ESS) is 11/24 today. She has not fallen asleep while driving. The patient has been taking a planned nap, which is usually 2 hours long, after lunch. There is some report of nighttime reflux, with rare nighttime cough experienced. She is known to kick while asleep or before falling asleep. There is family history of OSA in her mother.   She is a restless sleeper and in the morning, the bed is quite disheveled.    She denies cataplexy, sleep paralysis, hypnagogic or hypnopompic hallucinations, or sleep attacks. She  does not report any nightmares, dream enactments, or parasomnias, such as sleep talking or sleep walking. She consumes 2 to 3 caffeinated beverages per day, usually in the form of coffee and Oolong tea.    Her bedroom is usually dark and cool. There is a TV in the bedroom and usually it is not on at night.    Her Past Medical History Is Significant For: Past Medical History:  Diagnosis Date  . Anemia, unspecified 04/18/2012  . Bipolar 2 disorder ([place])   . Hypercholesteremia   . Hypothyroidism 02/01/2012  . Ischemic colitis ([place]) 2010  . Menopause   . Migraine   . Ulcerative colitis ([place]) 02/01/2012    Her Past Surgical History Is Significant For: Past Surgical History:  Procedure Laterality Date  . CATARACT EXTRACTION W/ INTRAOCULAR LENS IMPLANT Left 03/14/2016  . CATARACT EXTRACTION W/ INTRAOCULAR LENS IMPLANT Right 04/18/2016  . NASAL SEPTUM SURGERY      Her Family History Is Significant For: Family History  Problem Relation Age of Onset  . Colon cancer Father 61       Colon  . Hypertension Father   . Breast cancer Maternal Grandmother   . Heart failure Mother   . Osteoarthritis Mother   . Depression Mother   . Hypertension Mother    . Stroke Mother   . Cancer Paternal Grandmother        skin cancer, nose  . Heart attack Paternal Grandfather   . Cancer Maternal Grandfather        lung    Her Social History Is Significant For: Social History   Social History  . Marital status: Married    Spouse name: N/A  . Number of children: 1  . Years of education: BA   Occupational History  . N/A    Social History Main Topics  . Smoking status: Former Smoker    Quit date: 04/25/1991  . Smokeless tobacco: Never Used  . Alcohol use 3.6 oz/week    6 Standard drinks or equivalent per week     Comment: alcohol about 6 drinks a week  . Drug use: No  . Sexual activity: Yes    Partners: Male    Birth control/ protection: Post-menopausal   Other Topics Concern  . None   Social History Narrative   2 cups of coffee a day, occasional tea     Her Allergies Are:  Allergies  Allergen Reactions  . Penicillins   . Sulfa Antibiotics   . Xylocaine [Lidocaine]     Doesn't numb  :   Her Current Medications Are:  Outpatient Encounter Prescriptions as of 07/13/2016  Medication Sig  . belladonna alk-PHENObarbital (DONNATAL) 16.2 MG tablet Take 1 tablet by mouth as needed.  . budesonide (ENTOCORT EC) 3 MG 24 hr capsule   . clonazePAM (KLONOPIN) 0.5 MG tablet Take 1 tablet by mouth daily.  Marland Kitchen estradiol (ESTRACE) 0.5 MG tablet Take 1 tablet (0.5 mg total) by mouth daily.  . fluticasone (FLONASE) 50 MCG/ACT nasal spray Place 2 sprays into the nose daily as needed.  . lamoTRIgine (LAMICTAL) 200 MG tablet Take 200 mg by mouth daily.  Marland Kitchen LATUDA 80 MG TABS tablet Take 1 tablet by mouth daily.  Marland Kitchen loperamide (IMODIUM) 2 MG capsule Take 2 mg by mouth 4 (four) times daily as needed for diarrhea or loose stools.  Marland Kitchen loratadine (CLARITIN) 10 MG tablet Take 10 mg by mouth daily.  Marland Kitchen NEXIUM 40 MG capsule   . progesterone (PROMETRIUM) 100  MG capsule Take 1 capsule (100 mg total) by mouth daily.  . propranolol (INDERAL) 20 MG tablet Take 1  tablet by mouth daily as needed for anxiety.  . SYNTHROID 112 MCG tablet Take 1 tablet by mouth daily.  . Vitamin D, Ergocalciferol, (DRISDOL) 50000 UNITS CAPS capsule Take 50,000 Units by mouth once a week. Vitamin D  . [DISCONTINUED] cyanocobalamin 100 MCG tablet Take 100 mcg by mouth daily.  . [DISCONTINUED] cyclobenzaprine (FLEXERIL) 5 MG tablet Take 1 tablet by mouth daily as needed.   No facility-administered encounter medications on file as of 07/13/2016.   :  Review of Systems:  Out of a complete 14 point review of systems, all are reviewed and negative with the exception of these symptoms as listed below: Review of Systems  Neurological:       Pt presents today with follow up. Pt verbalizes no complaints and says her CPAP works well. She has no problems with getting supplies.    Objective:  Neurologic Exam  Physical Exam Physical Examination:   Vitals:   07/13/16 1519  BP: 133/74  Pulse: 67    General Examination: The patient is a very pleasant 61 y.o. female in no acute distress. She appears well-developed and well-nourished and well groomed.   HEENT: Normocephalic, atraumatic, pupils are equal, round and reactive to light and accommodation. Extraocular tracking is good without limitation to gaze excursion or nystagmus noted. Normal smooth pursuit is noted. Hearing is grossly intact. Face is symmetric with normal facial animation and normal facial sensation. Speech is clear with no dysarthria noted. There is no hypophonia. There is no lip, neck/head, jaw or voice tremor. Neck is supple with full range of passive and active motion. There are no carotid bruits on auscultation. Oropharynx exam reveals: moderate mouth dryness, adequate dental hygiene and moderate airway crowding. Mallampati is class II. Tongue protrudes centrally and palate elevates symmetrically. Tonsils are small. She has a Mild overbite.   Chest: Clear to auscultation without wheezing, rhonchi or crackles  noted.  Heart: S1+S2+0, regular and normal without murmurs, rubs or gallops noted.   Abdomen: Soft, non-tender and non-distended with normal bowel sounds appreciated on auscultation.  Extremities: There is no pitting edema in the distal lower extremities bilaterally. Pedal pulses are intact.  Skin: Warm and dry without trophic changes noted. There are no varicose veins, has large bruising across forearms, hands and also milder in the shin areas, history of purpura, she says.  Musculoskeletal: exam reveals no obvious joint deformities, tenderness or joint swelling or erythema.   Neurologically:  Mental status: The patient is awake, alert and oriented in all 4 spheres. Her immediate and remote memory, attention, language skills and fund of knowledge are appropriate. There is no evidence of aphasia, agnosia, apraxia or anomia. Speech is clear with normal prosody and enunciation. Thought process is linear. Mood is normal and affect is normal.  Cranial nerves II - XII are as described above under HEENT exam.  Motor exam: Normal bulk, strength and tone is noted. There is no drift, tremor or rebound. Romberg is negative. Reflexes are 1-2+ throughout. Babinski: Toes are flexor bilaterally. Fine motor skills and coordination: intact grossly.  Cerebellar testing: No dysmetria or intention tremor on finger to nose testing. Heel to shin is unremarkable bilaterally. There is no truncal or gait ataxia.  Sensory exam: intact to light touch in the upper and lower extremities.  Gait, station and balance: She stands easily. No veering to one side is noted. No  leaning to one side is noted. Posture is age-appropriate and stance is narrow based. Gait shows normal stride length and normal pace. No problems turning are noted. Tandem walk is challenging.   Assessment and Plan:   In summary, IDABELL PICKING is a very pleasant 61 year old female with an underlying medical history of ulcerative colitis, obesity  and bipolar disorder, who presents for follow-up consultation of her obstructive sleep apnea, established on treatment with CPAP with full compliance. She indicates ongoing good results with CPAP therapy. She is advised about her recent compliance data and is congratulated on her treatment adherence. She even reported improvement in her mood disorder once she started CPAP and also in her EDS and morning headaches and nocturia. She follows with a NP for her mood d/o after Dr. Caprice Beaver left the practice. Physical exam and neurological exam are mostly stable. Weight has been fluctuating and she is encouraged to try to pursue weight loss. She is trying to eat better and tries not to skip meals. I encouraged patient to continue with full CPAP compliance and she is commended for this. I would like to see her back in one year. I answered all her questions today and the patient was in agreement. I spent 25 minutes in total face-to-face time with the patient, more than 50% of which was spent in counseling and coordination of care, reviewing test results, reviewing medication and discussing or reviewing the diagnosis of OSA, its prognosis and treatment options. Pertinent laboratory and imaging test results that were available during this visit with the patient were reviewed by me and considered in my medical decision making (see chart for details).

## 2016-07-15 ENCOUNTER — Ambulatory Visit (INDEPENDENT_AMBULATORY_CARE_PROVIDER_SITE_OTHER): Payer: BLUE CROSS/BLUE SHIELD | Admitting: Physician Assistant

## 2016-07-15 ENCOUNTER — Encounter: Payer: Self-pay | Admitting: Physician Assistant

## 2016-07-15 VITALS — BP 132/88 | HR 70 | Temp 98.6°F | Resp 14 | Ht 67.0 in | Wt 236.0 lb

## 2016-07-15 DIAGNOSIS — R238 Other skin changes: Secondary | ICD-10-CM | POA: Diagnosis not present

## 2016-07-15 DIAGNOSIS — K5180 Other ulcerative colitis without complications: Secondary | ICD-10-CM

## 2016-07-15 DIAGNOSIS — R233 Spontaneous ecchymoses: Secondary | ICD-10-CM

## 2016-07-15 DIAGNOSIS — F3181 Bipolar II disorder: Secondary | ICD-10-CM

## 2016-07-15 DIAGNOSIS — E039 Hypothyroidism, unspecified: Secondary | ICD-10-CM | POA: Diagnosis not present

## 2016-07-15 LAB — COMPREHENSIVE METABOLIC PANEL
ALK PHOS: 63 U/L (ref 39–117)
ALT: 19 U/L (ref 0–35)
AST: 18 U/L (ref 0–37)
Albumin: 4 g/dL (ref 3.5–5.2)
BUN: 12 mg/dL (ref 6–23)
CHLORIDE: 100 meq/L (ref 96–112)
CO2: 29 meq/L (ref 19–32)
Calcium: 9.2 mg/dL (ref 8.4–10.5)
Creatinine, Ser: 0.88 mg/dL (ref 0.40–1.20)
GFR: 69.65 mL/min (ref >60.00)
GLUCOSE: 103 mg/dL — AB (ref 70–99)
POTASSIUM: 3.9 meq/L (ref 3.5–5.1)
Sodium: 138 mEq/L (ref 135–145)
Total Bilirubin: 0.6 mg/dL (ref 0.2–1.2)
Total Protein: 6.2 g/dL (ref 6.0–8.3)

## 2016-07-15 LAB — PROTIME-INR
INR: 1 ratio (ref 0.8–1.0)
Prothrombin Time: 11.1 s (ref 9.6–13.1)

## 2016-07-15 LAB — CBC
HCT: 42.4 % (ref 36.0–46.0)
Hemoglobin: 14 g/dL (ref 12.0–15.0)
MCHC: 32.9 g/dL (ref 30.0–36.0)
MCV: 97.2 fl (ref 78.0–100.0)
PLATELETS: 271 10*3/uL (ref 150.0–400.0)
RBC: 4.36 Mil/uL (ref 3.87–5.11)
RDW: 13.5 % (ref 11.5–15.5)
WBC: 6.7 10*3/uL (ref 4.0–10.5)

## 2016-07-15 LAB — APTT: APTT: 32.3 s (ref 23.4–32.7)

## 2016-07-15 LAB — TSH: TSH: 1.29 u[IU]/mL (ref 0.35–4.50)

## 2016-07-15 NOTE — Progress Notes (Signed)
Pre visit review using our clinic review tool, if applicable. No additional management support is needed unless otherwise documented below in the visit note. 

## 2016-07-15 NOTE — Patient Instructions (Signed)
Please go to the lab for blood work. I will call you with your results. We will alter Synthroid dose if needed.  Continue other chronic medications as directed. Follow-up with specialists as scheduled.  I will be obtaining your previous records for review. We will call and let you know if you are due for a complete physical.  Speak with your Gastroenterologist about alternatives to Entercort due to being ineffective and is likely contributor to easy bruising.

## 2016-07-15 NOTE — Progress Notes (Signed)
Patient presents to clinic today to establish care.  Acute Concerns: Patient endorses easy bruising over the past year. States this happens typically on arms and legs. Occasionally other areas. Denies easy bleeding. Denies gingival bleeding when brushing teeth. Denies anticoagulant therapy. Denies family history of bleeding disorder. Has been on chronic steroid for UC treatment.   Chronic Issues: Hypothyroidism -- Long-standing history. Is currently on synthroid 112 mcg daily. Endorses taking medication as directed. Is due for repeat TSH levels per patient.   Bipolar Disorder -- Followed by Psychiatry. Is currently on a regimen of Lamictal 200 mg QD, Latuda 80 mg QD, and Inderal 20 mg QD PRN. Endorses taking as directed. Denies breakthrough symptoms with current regimen. Denies SI/HI.  Ulcerative Colitis -- Followed by Gastroenterology. Colonoscopy up-to-date. Is currently on Budesonide which she states she has taken for quite some time. Notes occasional mucoid and bloody stools. Is wanting to schedule appointment with her specialist to discuss other treatment options as she feels the Budesonide is subtherapeutic.  Health Maintenance: Immunizations -- unsure of tetanus. Will need records. Colonoscopy --01/12/3012 Mammogram -- 03/03/2016 PAP -- 05/12/2015    Past Medical History:  Diagnosis Date  . Anemia, unspecified 04/18/2012  . Bipolar 2 disorder ([place])   . History of chickenpox   . Hypercholesteremia   . Hypothyroidism 02/01/2012  . Ischemic colitis ([place]) 2010  . Menopause   . Migraine   . Ulcerative colitis ([place]) 02/01/2012    Past Surgical History:  Procedure Laterality Date  . CATARACT EXTRACTION W/ INTRAOCULAR LENS IMPLANT Left 03/14/2016  . CATARACT EXTRACTION W/ INTRAOCULAR LENS IMPLANT Right 04/18/2016  . NASAL SEPTUM SURGERY      Current Outpatient Prescriptions on File Prior to Visit  Medication Sig Dispense Refill  . belladonna alk-PHENObarbital (DONNATAL) 16.2 MG  tablet Take 1 tablet by mouth as needed.    . budesonide (ENTOCORT EC) 3 MG 24 hr capsule     . clonazePAM (KLONOPIN) 0.5 MG tablet Take 1 tablet by mouth daily.    Marland Kitchen estradiol (ESTRACE) 0.5 MG tablet Take 1 tablet (0.5 mg total) by mouth daily. 90 tablet 4  . fluticasone (FLONASE) 50 MCG/ACT nasal spray Place 2 sprays into the nose daily as needed.    . lamoTRIgine (LAMICTAL) 200 MG tablet Take 200 mg by mouth daily.    Marland Kitchen LATUDA 80 MG TABS tablet Take 1 tablet by mouth daily.    Marland Kitchen loperamide (IMODIUM) 2 MG capsule Take 2 mg by mouth 4 (four) times daily as needed for diarrhea or loose stools.    Marland Kitchen loratadine (CLARITIN) 10 MG tablet Take 10 mg by mouth daily.    Marland Kitchen NEXIUM 40 MG capsule     . progesterone (PROMETRIUM) 100 MG capsule Take 1 capsule (100 mg total) by mouth daily. 90 capsule 4  . propranolol (INDERAL) 20 MG tablet Take 1 tablet by mouth daily as needed for anxiety.    . SYNTHROID 112 MCG tablet Take 1 tablet by mouth daily.    . Vitamin D, Ergocalciferol, (DRISDOL) 50000 UNITS CAPS capsule Take 50,000 Units by mouth once a week. Vitamin D     No current facility-administered medications on file prior to visit.     Allergies  Allergen Reactions  . Penicillins Hives  . Sulfa Antibiotics Hives  . Xylocaine [Lidocaine] Other (See Comments)    Doesn't numb    Family History  Problem Relation Age of Onset  . Colon cancer Father 72  Colon  . Hypertension Father   . Breast cancer Maternal Grandmother   . Heart failure Mother   . Osteoarthritis Mother   . Depression Mother   . Hypertension Mother   . Stroke Mother   . Cancer Paternal Grandmother        skin cancer, nose  . Heart attack Paternal Grandfather   . Cancer Maternal Grandfather        lung    Social History   Social History  . Marital status: Married    Spouse name: N/A  . Number of children: 1  . Years of education: BA   Occupational History  . N/A    Social History Main Topics  . Smoking  status: Former Smoker    Quit date: 04/25/1991  . Smokeless tobacco: Never Used  . Alcohol use 3.6 oz/week    6 Standard drinks or equivalent per week     Comment: alcohol about 6 drinks a week  . Drug use: No  . Sexual activity: Yes    Partners: Male    Birth control/ protection: Post-menopausal   Other Topics Concern  . Not on file   Social History Narrative   2 cups of coffee a day, occasional tea    Review of Systems  Constitutional: Negative for fever.  Eyes: Negative for blurred vision and double vision.  Respiratory: Negative for cough, hemoptysis and shortness of breath.   Cardiovascular: Negative for chest pain and palpitations.  Gastrointestinal: Negative for blood in stool.  Neurological: Negative for dizziness, loss of consciousness, weakness and headaches.  Endo/Heme/Allergies: Bruises/bleeds easily.  Psychiatric/Behavioral: Negative for depression, hallucinations, substance abuse and suicidal ideas. The patient is not nervous/anxious and does not have insomnia.    BP 132/88   Pulse 70   Temp 98.6 F (37 C) (Oral)   Resp 14   Ht 5' 7" (1.702 m)   Wt 236 lb (107 kg)   LMP 01/31/2002 (Approximate)   SpO2 99%   BMI 36.96 kg/m   Physical Exam  Constitutional: She is oriented to person, place, and time and well-developed, well-nourished, and in no distress.  HENT:  Head: Normocephalic and atraumatic.  Eyes: Conjunctivae are normal. Pupils are equal, round, and reactive to light.  Neck: Neck supple.  Cardiovascular: Normal rate, regular rhythm, normal heart sounds and intact distal pulses.   Pulmonary/Chest: Effort normal and breath sounds normal. No respiratory distress. She has no wheezes. She has no rales. She exhibits no tenderness.  Lymphadenopathy:    She has no cervical adenopathy.  Neurological: She is alert and oriented to person, place, and time. No cranial nerve deficit.  Skin: Skin is warm and dry.  Scattered purpura and ecchymoses noted of  extensor surfaces of upper and lower extremities bilaterally. No noted petechia of oral mucosa on examination.  Psychiatric: Affect normal.  Vitals reviewed.   Recent Results (from the past 2160 hour(s))  Comp Met (CMET)     Status: Abnormal   Collection Time: 07/15/16  2:21 PM  Result Value Ref Range   Sodium 138 135 - 145 mEq/L   Potassium 3.9 3.5 - 5.1 mEq/L   Chloride 100 96 - 112 mEq/L   CO2 29 19 - 32 mEq/L   Glucose, Bld 103 (H) 70 - 99 mg/dL   BUN 12 6 - 23 mg/dL   Creatinine, Ser 0.88 0.40 - 1.20 mg/dL   Total Bilirubin 0.6 0.2 - 1.2 mg/dL   Alkaline Phosphatase 63 39 - 117 U/L  AST 18 0 - 37 U/L   ALT 19 0 - 35 U/L   Total Protein 6.2 6.0 - 8.3 g/dL   Albumin 4.0 3.5 - 5.2 g/dL   Calcium 9.2 8.4 - 10.5 mg/dL   GFR 69.65 >60.00 mL/min  INR/PT     Status: None   Collection Time: 07/15/16  2:21 PM  Result Value Ref Range   INR 1.0 0.8 - 1.0 ratio   Prothrombin Time 11.1 9.6 - 13.1 sec  PTT     Status: None   Collection Time: 07/15/16  2:21 PM  Result Value Ref Range   aPTT 32.3 23.4 - 32.7 SEC  TSH     Status: None   Collection Time: 07/15/16  2:21 PM  Result Value Ref Range   TSH 1.29 0.35 - 4.50 uIU/mL  CBC     Status: None   Collection Time: 07/15/16  2:21 PM  Result Value Ref Range   WBC 6.7 4.0 - 10.5 K/uL   RBC 4.36 3.87 - 5.11 Mil/uL   Platelets 271.0 150.0 - 400.0 K/uL   Hemoglobin 14.0 12.0 - 15.0 g/dL   HCT 42.4 36.0 - 46.0 %   MCV 97.2 78.0 - 100.0 fl   MCHC 32.9 30.0 - 36.0 g/dL   RDW 13.5 11.5 - 15.5 %    Assessment/Plan: Ulcerative colitis Patient to follow-up with GI concerning alternative treatments to Budesonide for her ulcerative colitis as she notes is subtherapeutic and likely culprit of easy bruising.  Hypothyroidism Repeat labs today. Will continue Synthroid, altering dose if indicated by lab results.  Bipolar 2 disorder Stable. Followed by Psychiatry. Continue care as directed.  Easy bruising Likely related to long-term  steroid use. She is to speak with GI regarding her Budesonide. Will obtain lab panel to assess further.     Leeanne Rio, PA-C

## 2016-07-18 DIAGNOSIS — R238 Other skin changes: Secondary | ICD-10-CM | POA: Insufficient documentation

## 2016-07-18 DIAGNOSIS — R233 Spontaneous ecchymoses: Secondary | ICD-10-CM | POA: Insufficient documentation

## 2016-07-18 NOTE — Assessment & Plan Note (Signed)
Patient to follow-up with GI concerning alternative treatments to Budesonide for her ulcerative colitis as she notes is subtherapeutic and likely culprit of easy bruising.

## 2016-07-18 NOTE — Assessment & Plan Note (Signed)
Repeat labs today. Will continue Synthroid, altering dose if indicated by lab results.

## 2016-07-18 NOTE — Assessment & Plan Note (Signed)
Stable. Followed by Psychiatry. Continue care as directed.

## 2016-07-18 NOTE — Assessment & Plan Note (Signed)
Likely related to long-term steroid use. She is to speak with GI regarding her Budesonide. Will obtain lab panel to assess further.

## 2016-07-21 ENCOUNTER — Encounter: Payer: Self-pay | Admitting: Emergency Medicine

## 2016-08-04 ENCOUNTER — Other Ambulatory Visit: Payer: Self-pay | Admitting: Certified Nurse Midwife

## 2016-08-04 DIAGNOSIS — Z79890 Hormone replacement therapy (postmenopausal): Secondary | ICD-10-CM

## 2016-08-04 NOTE — Telephone Encounter (Signed)
Medication refill request: estradiol, progesterone  Last AEX:  05/13/16 PG Next AEX:  Last MMG (if hormonal medication request): 03/03/16 BIRADS1:neg  Refill authorized: both 05/13/16 #90/4R to [facility]

## 2016-08-09 ENCOUNTER — Other Ambulatory Visit: Payer: Self-pay | Admitting: Nurse Practitioner

## 2016-08-09 NOTE — Telephone Encounter (Signed)
RX was denied by our office on 7/5 since refill was current as of 05/13/16 in medication list. I spoke to [facility] at [place] to see if they received that refill.  Per Solmon Ice, this prescription was voided on 4/13 as it was being refilled too soon. (patient had received 90 day supply on 04/08/56.) Per Solmon Ice, 4/41/71 refill has been reinstated by [facility] and they will process and mail prescription to patient.  I spoke to the patient to advise her medication would be filled and mailed by [facility] and she should have it within 8 days per [place] at [employer]. Patient states she has 10 days of medication.

## 2016-08-09 NOTE — Telephone Encounter (Signed)
Patient calling to check on refill request for estradiol and progesterone that [facility] said was denied.

## 2016-08-23 ENCOUNTER — Other Ambulatory Visit: Payer: Self-pay | Admitting: Emergency Medicine

## 2016-08-24 MED ORDER — SYNTHROID 112 MCG PO TABS: 112.0000 ug | ORAL_TABLET | Freq: Every day | ORAL | 1 refills | Status: DC

## 2016-09-09 ENCOUNTER — Ambulatory Visit: Payer: BLUE CROSS/BLUE SHIELD | Admitting: Family Medicine

## 2016-09-09 ENCOUNTER — Encounter: Payer: Self-pay | Admitting: Family Medicine

## 2016-09-09 ENCOUNTER — Ambulatory Visit (INDEPENDENT_AMBULATORY_CARE_PROVIDER_SITE_OTHER): Payer: BLUE CROSS/BLUE SHIELD | Admitting: Family Medicine

## 2016-09-09 VITALS — BP 130/86 | HR 100 | Temp 98.1°F | Resp 16 | Ht 67.0 in | Wt 241.1 lb

## 2016-09-09 DIAGNOSIS — M25512 Pain in left shoulder: Secondary | ICD-10-CM

## 2016-09-09 MED ORDER — MELOXICAM 15 MG PO TABS: 15.0000 mg | ORAL_TABLET | Freq: Every day | ORAL | 1 refills | Status: DC

## 2016-09-09 MED ORDER — CYCLOBENZAPRINE HCL 10 MG PO TABS: 10.0000 mg | ORAL_TABLET | Freq: Three times a day (TID) | ORAL | 0 refills | Status: DC | PRN

## 2016-09-09 NOTE — Progress Notes (Signed)
   Subjective:    Patient ID: ERCIA CRISAFULLI, female    DOB: 04/17/1955, 61 y.o.   MRN: [id number]  HPI Shoulder pain- L shoulder.  Started Monday in L trap.  Radiated to neck and down to upper arm.  Worst pain was Wednesday.  Has eased somewhat.  No known injury.  No change in activity level.  Pain improved w/ left over flexeril.  Pain worsens w/ turning head or reaching back to get seatbelt.   Review of Systems For ROS see HPI     Objective:   Physical Exam  Constitutional: She is oriented to person, place, and time. She appears well-developed and well-nourished. No distress.  HENT:  Head: Normocephalic and atraumatic.  Cardiovascular: Intact distal pulses.   Musculoskeletal: She exhibits tenderness (mild TTP over L AC joint, head of biceps tendon and L trap). She exhibits no edema or deformity.  Full ROM of neck Full forward flexion of L shoulder Pain w/ abduction of L shoulder (-) impingement signs + pain w/ internal rotation  Neurological: She is alert and oriented to person, place, and time. She has normal reflexes. No cranial nerve deficit. Coordination normal.  Skin: Skin is warm and dry. No erythema.  Psychiatric: She has a normal mood and affect. Her behavior is normal. Thought content normal.  Vitals reviewed.         Assessment & Plan:  L shoulder pain- new.  Pt's sxs are consistent w/ muscular issues- trap spasm and inflammation of biceps tendon.  Start daily NSAID.  Flexeril prn.  Reviewed supportive care and red flags that should prompt return.  If no improvement will refer to sports med.  Pt expressed understanding and is in agreement w/ plan.

## 2016-09-09 NOTE — Progress Notes (Signed)
Pre visit review using our clinic review tool, if applicable. No additional management support is needed unless otherwise documented below in the visit note. 

## 2016-09-09 NOTE — Patient Instructions (Signed)
Follow up as needed or as scheduled Start the Meloxicam once daily (w/ food) for pain and inflammation.  Take for 10 days and then as needed for pain Use the cyclobenzaprine (flexeril) as needed for pain/spasm (may cause drowsiness) Heat the spasm in the neck, ice the shoulder Call with any questions or concerns- particularly if not improving Hang in there!!!

## 2016-09-12 DIAGNOSIS — G4733 Obstructive sleep apnea (adult) (pediatric): Secondary | ICD-10-CM | POA: Diagnosis not present

## 2016-10-10 ENCOUNTER — Telehealth: Payer: Self-pay | Admitting: Obstetrics & Gynecology

## 2016-10-10 NOTE — Telephone Encounter (Signed)
Spoke with patient. Reports painful, tender "boil" on inside of right labia. Bloody drainage a couple of days ago. Feels like it needs to burst. Patient is on vacation, will return tomorrow evening. Recommended OV for further evaluation. Moved patient appointment to earlier time at 10am with Dr. Talbert Nan. Patient has not applied any warm compresses or soaks, will plan to start this evening for relief.   Patient states she has had boils in the past, is there any medication I should start on? Advised will review with provider and return call with any additional recommendations. Patient verbalizes understanding.   Dr. Talbert Nan, any additional recommendations?  Cc: Melvia Heaps, CNM

## 2016-10-10 NOTE — Telephone Encounter (Signed)
Patient called and requested an appointment on Wednesday, 10/12/16, for a "boil" on her vagina. The patient is now scheduled for 4:00 PM on 10/12/16 with Melvia Heaps, CNM. She used to see Kem Boroughs, FNP but I put the telephone note under the provider of the day today, Dr. Sabra Heck. The patient would like to speak with the nurse for care recommendations until her appointment.

## 2016-10-10 NOTE — Telephone Encounter (Signed)
Left message to call Jadine Brumley at [phone].  

## 2016-10-10 NOTE — Telephone Encounter (Signed)
Reviewed with Dr. Talbert Nan, agrees with recommendations, needs OV for further evaluation.  Will close encounter.

## 2016-10-12 ENCOUNTER — Ambulatory Visit: Payer: BLUE CROSS/BLUE SHIELD | Admitting: Certified Nurse Midwife

## 2016-10-12 ENCOUNTER — Encounter: Payer: Self-pay | Admitting: Obstetrics and Gynecology

## 2016-10-12 ENCOUNTER — Ambulatory Visit (INDEPENDENT_AMBULATORY_CARE_PROVIDER_SITE_OTHER): Payer: BLUE CROSS/BLUE SHIELD | Admitting: Obstetrics and Gynecology

## 2016-10-12 VITALS — BP 138/80 | HR 108 | Resp 18 | Wt 244.0 lb

## 2016-10-12 DIAGNOSIS — N764 Abscess of vulva: Secondary | ICD-10-CM | POA: Diagnosis not present

## 2016-10-12 DIAGNOSIS — Z79890 Hormone replacement therapy (postmenopausal): Secondary | ICD-10-CM | POA: Diagnosis not present

## 2016-10-12 DIAGNOSIS — N760 Acute vaginitis: Secondary | ICD-10-CM

## 2016-10-12 MED ORDER — ESTRADIOL 0.0375 MG/24HR TD PTWK: 0.0375 mg | MEDICATED_PATCH | TRANSDERMAL | 6 refills | Status: DC

## 2016-10-12 NOTE — Progress Notes (Signed)
GYNECOLOGY  VISIT   HPI: 61 y.o.   Married  Caucasian  female   G1P1001 with Patient's last menstrual period was 01/31/2002 (approximate).   here c/o a cyst on her labia. It started draining yesterday.  She first noticed a tender lump on Saturday, got more and more tender and larger. Yesterday she started draining blood, no pus. Today she c/o vaginal itching. No abnormal d/c. Not itchy at the spot that drained, but at the opening of her vagina and up inside her vagina. No vaginal odor.  She is on HRT, wakes up one time at night hot, no sweats. Gets occasional hot flashes. She was talking with Patty about decreasing her dose of HRT.   GYNECOLOGIC HISTORY: Patient's last menstrual period was 01/31/2002 (approximate). Contraception:postmenopause  Menopausal hormone therapy: Estradiol and Progesterone         OB History    Gravida Para Term Preterm AB Living   _0 0 0 1   SAB TAB Ectopic Multiple Live Births   0 0 0 0 1         Patient Active Problem List   Diagnosis Date Noted  . Easy bruising 07/18/2016  . Hypothyroidism 07/15/2016  . Bipolar 2 disorder ([place])   . Ischemic colitis ([place])   . Anemia, unspecified 04/18/2012  . Ulcerative colitis ([place]) 02/01/2012    Past Medical History:  Diagnosis Date  . Anemia, unspecified 04/18/2012  . Bipolar 2 disorder ([place])   . History of chickenpox   . Hypercholesteremia   . Hypothyroidism 02/01/2012  . Ischemic colitis ([place]) 2010  . Menopause   . Migraine   . Ulcerative colitis ([place]) 02/01/2012    Past Surgical History:  Procedure Laterality Date  . CATARACT EXTRACTION W/ INTRAOCULAR LENS IMPLANT Left 03/14/2016  . CATARACT EXTRACTION W/ INTRAOCULAR LENS IMPLANT Right 04/18/2016  . NASAL SEPTUM SURGERY      Current Outpatient Prescriptions  Medication Sig Dispense Refill  . belladonna alk-PHENObarbital (DONNATAL) 16.2 MG tablet Take 1 tablet by mouth as needed.    . clonazePAM (KLONOPIN) 0.5 MG tablet Take 1 tablet by mouth daily.     . cyclobenzaprine (FLEXERIL) 10 MG tablet Take 1 tablet (10 mg total) by mouth 3 (three) times daily as needed for muscle spasms. 30 tablet 0  . estradiol (ESTRACE) 0.5 MG tablet Take 1 tablet (0.5 mg total) by mouth daily. 90 tablet 4  . fluticasone (FLONASE) 50 MCG/ACT nasal spray Place 2 sprays into the nose daily as needed.    . lamoTRIgine (LAMICTAL) 200 MG tablet Take 200 mg by mouth daily.    Marland Kitchen LATUDA 80 MG TABS tablet Take 1 tablet by mouth daily.    Marland Kitchen loperamide (IMODIUM) 2 MG capsule Take 2 mg by mouth 4 (four) times daily as needed for diarrhea or loose stools.    Marland Kitchen loratadine (CLARITIN) 10 MG tablet Take 10 mg by mouth daily.    . meloxicam (MOBIC) 15 MG tablet Take 1 tablet (15 mg total) by mouth daily. 30 tablet 1  . NEXIUM 40 MG capsule     . progesterone (PROMETRIUM) 100 MG capsule Take 1 capsule (100 mg total) by mouth daily. 90 capsule 4  . propranolol (INDERAL) 20 MG tablet Take 1 tablet by mouth daily as needed for anxiety.    . SYNTHROID 112 MCG tablet Take 1 tablet (112 mcg total) by mouth daily. 90 tablet 1  . Vitamin D, Ergocalciferol, (DRISDOL) 50000 UNITS CAPS capsule Take 50,000 Units  by mouth once a week. Vitamin D     No current facility-administered medications for this visit.      ALLERGIES: Penicillins; Sulfa antibiotics; and Xylocaine [lidocaine]  Family History  Problem Relation Age of Onset  . Colon cancer Father 73       Colon  . Hypertension Father   . Breast cancer Maternal Grandmother   . Heart failure Mother   . Osteoarthritis Mother   . Depression Mother   . Hypertension Mother   . Stroke Mother   . Cancer Paternal Grandmother        skin cancer, nose  . Heart attack Paternal Grandfather   . Cancer Maternal Grandfather        lung    Social History   Social History  . Marital status: Married    Spouse name: N/A  . Number of children: 1  . Years of education: BA   Occupational History  . N/A    Social History Main Topics  .  Smoking status: Former Smoker    Quit date: 04/25/1991  . Smokeless tobacco: Never Used  . Alcohol use 3.6 oz/week    6 Standard drinks or equivalent per week     Comment: alcohol about 6 drinks a week  . Drug use: No  . Sexual activity: Yes    Partners: Male    Birth control/ protection: Post-menopausal   Other Topics Concern  . Not on file   Social History Narrative   2 cups of coffee a day, occasional tea     Review of Systems  Constitutional: Negative.   HENT: Negative.   Eyes: Negative.   Respiratory: Negative.   Cardiovascular: Negative.   Gastrointestinal: Negative.   Genitourinary:       Cyst on right labia  Musculoskeletal: Negative.   Skin: Negative.   Neurological: Negative.   Endo/Heme/Allergies: Negative.   Psychiatric/Behavioral: Negative.     PHYSICAL EXAMINATION:    BP 138/80 (BP Location: Right Arm, Patient Position: Sitting, Cuff Size: Normal)   Pulse (!) 108   Resp 18   Wt 244 lb (110.7 kg)   LMP 01/31/2002 (Approximate)   BMI 38.22 kg/m     General appearance: alert, cooperative and appears stated age  Pelvic: External genitalia: draining boil right labia majora, currently small, no surrounding erythema, no active drainage              Urethra:  normal appearing urethra with no masses, tenderness or lesions              Bartholins and Skenes: normal                 Vagina: normal appearing vagina with normal color and a slight increase in thin, white vaginal d/c              Cervix: no lesions               Chaperone was present for exam.  Wet prep: no clue, no trich, few wbc KOH: no yeast PH: 4   ASSESSMENT Vulvar boil, spontaneously drained, no signs of surrounding infection Vaginitis, negative vaginal slides HRT, will decrease her estrogen    PLAN Continue warm compress and hot soaks to the vulva Affirm sent, will treat accordingly Can use Vaseline externally Stop oral estrogen (0.5 mg), start the patch 0.375 mg (she will let  us know if she needs [place] to [facility]) She will continue the nightly prometrium Call  with any concerns   An After Visit Summary was printed and given to the patient.  20 minutes face to face time of which over 50% was spent in counseling.

## 2016-10-13 LAB — VAGINITIS/VAGINOSIS, DNA PROBE
Candida Species: NEGATIVE
Gardnerella vaginalis: NEGATIVE
Trichomonas vaginosis: NEGATIVE

## 2016-10-18 DIAGNOSIS — F3162 Bipolar disorder, current episode mixed, moderate: Secondary | ICD-10-CM | POA: Diagnosis not present

## 2016-11-02 ENCOUNTER — Ambulatory Visit (INDEPENDENT_AMBULATORY_CARE_PROVIDER_SITE_OTHER): Payer: BLUE CROSS/BLUE SHIELD

## 2016-11-02 DIAGNOSIS — Z23 Encounter for immunization: Secondary | ICD-10-CM

## 2016-11-07 ENCOUNTER — Other Ambulatory Visit: Payer: Self-pay | Admitting: Family Medicine

## 2016-11-28 ENCOUNTER — Telehealth: Payer: Self-pay | Admitting: Physician Assistant

## 2016-11-28 DIAGNOSIS — Z8639 Personal history of other endocrine, nutritional and metabolic disease: Secondary | ICD-10-CM

## 2016-11-28 NOTE — Telephone Encounter (Signed)
Have never prescribed for patient. I do see on list. Needs updated Vitamin D level to verify dose is correct before sending in refills.

## 2016-11-28 NOTE — Telephone Encounter (Signed)
Pt is asking for a refill on vitD, [facility] mail order.

## 2016-11-29 NOTE — Telephone Encounter (Signed)
LMOVM advising patient per PCP she would need a lab visit to check Vitamin D level before refilling medication. Vitamin D level order placed.

## 2016-12-01 ENCOUNTER — Other Ambulatory Visit (INDEPENDENT_AMBULATORY_CARE_PROVIDER_SITE_OTHER): Payer: BLUE CROSS/BLUE SHIELD

## 2016-12-01 DIAGNOSIS — Z8639 Personal history of other endocrine, nutritional and metabolic disease: Secondary | ICD-10-CM | POA: Diagnosis not present

## 2016-12-02 ENCOUNTER — Other Ambulatory Visit: Payer: Self-pay | Admitting: *Deleted

## 2016-12-02 LAB — VITAMIN D 25 HYDROXY (VIT D DEFICIENCY, FRACTURES): VITD: 45.25 ng/mL (ref 30.00–100.00)

## 2016-12-02 MED ORDER — ESTRADIOL 0.0375 MG/24HR TD PTWK: 0.0375 mg | MEDICATED_PATCH | TRANSDERMAL | 1 refills | Status: DC

## 2016-12-02 NOTE — Telephone Encounter (Signed)
Medication refill request: climara patch  Last AEX:  05/13/16 PG Next AEX: 05/17/17 DL Last MMG (if hormonal medication request): 03/03/16 BIRADS1:neg  Refill authorized: 10/12/16 #4patch/6R. To local pharmacy.   Patient [facility]. Ok per Dr. Gentry Fitz note from 10/12/16 "Stop oral estrogen (0.5 mg), start the patch 0.375 mg (she will let us know if she needs [place] to change this [facility])"  Rx approved #[id number] as approved previously by Dr. Talbert Nan.  Encounter closed.

## 2016-12-05 ENCOUNTER — Encounter: Payer: Self-pay | Admitting: Emergency Medicine

## 2016-12-06 ENCOUNTER — Other Ambulatory Visit: Payer: Self-pay | Admitting: Emergency Medicine

## 2016-12-06 DIAGNOSIS — E559 Vitamin D deficiency, unspecified: Secondary | ICD-10-CM

## 2016-12-06 MED ORDER — VITAMIN D (ERGOCALCIFEROL) 1.25 MG (50000 UNIT) PO CAPS: 50000.0000 [IU] | ORAL_CAPSULE | ORAL | 0 refills | Status: DC

## 2016-12-06 NOTE — Telephone Encounter (Signed)
Ok to refill quantity 12 with 0 refills. Repeat D level once more in 3 months. If stable will continue script.

## 2016-12-19 DIAGNOSIS — G4733 Obstructive sleep apnea (adult) (pediatric): Secondary | ICD-10-CM | POA: Diagnosis not present

## 2016-12-21 DIAGNOSIS — F3162 Bipolar disorder, current episode mixed, moderate: Secondary | ICD-10-CM | POA: Diagnosis not present

## 2016-12-27 DIAGNOSIS — Z1211 Encounter for screening for malignant neoplasm of colon: Secondary | ICD-10-CM | POA: Diagnosis not present

## 2016-12-27 DIAGNOSIS — K760 Fatty (change of) liver, not elsewhere classified: Secondary | ICD-10-CM | POA: Diagnosis not present

## 2016-12-27 DIAGNOSIS — Z800 Family history of malignant neoplasm of digestive organs: Secondary | ICD-10-CM | POA: Diagnosis not present

## 2016-12-27 DIAGNOSIS — K219 Gastro-esophageal reflux disease without esophagitis: Secondary | ICD-10-CM | POA: Diagnosis not present

## 2017-01-17 ENCOUNTER — Other Ambulatory Visit: Payer: Self-pay | Admitting: Certified Nurse Midwife

## 2017-01-17 DIAGNOSIS — Z1231 Encounter for screening mammogram for malignant neoplasm of breast: Secondary | ICD-10-CM

## 2017-01-18 DIAGNOSIS — F3162 Bipolar disorder, current episode mixed, moderate: Secondary | ICD-10-CM | POA: Diagnosis not present

## 2017-02-07 ENCOUNTER — Encounter: Payer: Self-pay | Admitting: Emergency Medicine

## 2017-02-07 ENCOUNTER — Other Ambulatory Visit: Payer: Self-pay | Admitting: Physician Assistant

## 2017-02-10 DIAGNOSIS — D120 Benign neoplasm of cecum: Secondary | ICD-10-CM | POA: Diagnosis not present

## 2017-02-10 DIAGNOSIS — Z800 Family history of malignant neoplasm of digestive organs: Secondary | ICD-10-CM | POA: Diagnosis not present

## 2017-02-10 DIAGNOSIS — D123 Benign neoplasm of transverse colon: Secondary | ICD-10-CM | POA: Diagnosis not present

## 2017-02-10 DIAGNOSIS — Z1211 Encounter for screening for malignant neoplasm of colon: Secondary | ICD-10-CM | POA: Diagnosis not present

## 2017-02-10 DIAGNOSIS — D122 Benign neoplasm of ascending colon: Secondary | ICD-10-CM | POA: Diagnosis not present

## 2017-02-10 DIAGNOSIS — K635 Polyp of colon: Secondary | ICD-10-CM | POA: Diagnosis not present

## 2017-02-10 LAB — HM COLONOSCOPY

## 2017-02-13 ENCOUNTER — Encounter: Payer: Self-pay | Admitting: Physician Assistant

## 2017-02-13 ENCOUNTER — Telehealth: Payer: Self-pay | Admitting: Certified Nurse Midwife

## 2017-02-13 NOTE — Telephone Encounter (Signed)
Left message to call Oceane Fosse at [phone].  

## 2017-02-13 NOTE — Telephone Encounter (Signed)
Spoke with patient, advised as seen below per Dr. Talbert Nan. Patient verbalizes understanding and is agreeable. Will close encounter.

## 2017-02-13 NOTE — Telephone Encounter (Signed)
Patient states she is having external vaginal pain.  Says she thinks she has a cyst near the crease of her right thigh.

## 2017-02-13 NOTE — Telephone Encounter (Signed)
Spoke with patient. Reports raw, tender area on right side of labia with bloody drainage and an area above "pubis" that is sore and open. Areas present since before 12/25.   Noticed a new area on 1/13, feels like a pimple, inner thigh, rubs when walking. Denies any drainage, unable to see area.   Denies fever/chills, vaginal bleeding/ D/c or odor.   Recommended OV for further evaluation. OV scheduled for 1/15 at 10:30am with Dr. Talbert Nan. Advised Dr. Talbert Nan will review, I will return call with any additional recommendations, patient is agreeable.   Routing to provider for final review. Patient is agreeable to disposition. Will close encounter.

## 2017-02-13 NOTE — Telephone Encounter (Signed)
She can use warm compresses and hot soaks as needed.

## 2017-02-14 ENCOUNTER — Other Ambulatory Visit: Payer: Self-pay

## 2017-02-14 ENCOUNTER — Ambulatory Visit: Payer: BLUE CROSS/BLUE SHIELD | Admitting: Obstetrics and Gynecology

## 2017-02-14 ENCOUNTER — Encounter: Payer: Self-pay | Admitting: Obstetrics and Gynecology

## 2017-02-14 ENCOUNTER — Encounter: Payer: Self-pay | Admitting: Physician Assistant

## 2017-02-14 ENCOUNTER — Ambulatory Visit (INDEPENDENT_AMBULATORY_CARE_PROVIDER_SITE_OTHER): Payer: BLUE CROSS/BLUE SHIELD | Admitting: Physician Assistant

## 2017-02-14 VITALS — BP 138/80 | HR 88 | Resp 14 | Wt 238.0 lb

## 2017-02-14 VITALS — BP 120/82 | HR 63 | Temp 97.8°F | Resp 14 | Ht 67.0 in | Wt 234.0 lb

## 2017-02-14 DIAGNOSIS — K5180 Other ulcerative colitis without complications: Secondary | ICD-10-CM

## 2017-02-14 DIAGNOSIS — N762 Acute vulvitis: Secondary | ICD-10-CM | POA: Diagnosis not present

## 2017-02-14 DIAGNOSIS — N898 Other specified noninflammatory disorders of vagina: Secondary | ICD-10-CM

## 2017-02-14 DIAGNOSIS — E559 Vitamin D deficiency, unspecified: Secondary | ICD-10-CM

## 2017-02-14 DIAGNOSIS — F3181 Bipolar II disorder: Secondary | ICD-10-CM | POA: Diagnosis not present

## 2017-02-14 DIAGNOSIS — E039 Hypothyroidism, unspecified: Secondary | ICD-10-CM

## 2017-02-14 DIAGNOSIS — L0292 Furuncle, unspecified: Secondary | ICD-10-CM | POA: Diagnosis not present

## 2017-02-14 DIAGNOSIS — R21 Rash and other nonspecific skin eruption: Secondary | ICD-10-CM | POA: Diagnosis not present

## 2017-02-14 DIAGNOSIS — Z0000 Encounter for general adult medical examination without abnormal findings: Secondary | ICD-10-CM | POA: Diagnosis not present

## 2017-02-14 LAB — CBC WITH DIFFERENTIAL/PLATELET
BASOS ABS: 0 10*3/uL (ref 0.0–0.1)
Basophils Relative: 0.7 % (ref 0.0–3.0)
Eosinophils Absolute: 0.1 10*3/uL (ref 0.0–0.7)
Eosinophils Relative: 1.9 % (ref 0.0–5.0)
HCT: 45.6 % (ref 36.0–46.0)
Hemoglobin: 14.9 g/dL (ref 12.0–15.0)
LYMPHS ABS: 1.5 10*3/uL (ref 0.7–4.0)
Lymphocytes Relative: 21.8 % (ref 12.0–46.0)
MCHC: 32.7 g/dL (ref 30.0–36.0)
MCV: 97.4 fl (ref 78.0–100.0)
MONO ABS: 0.6 10*3/uL (ref 0.1–1.0)
Monocytes Relative: 9.2 % (ref 3.0–12.0)
NEUTROS PCT: 66.4 % (ref 43.0–77.0)
Neutro Abs: 4.5 10*3/uL (ref 1.4–7.7)
Platelets: 323 10*3/uL (ref 150.0–400.0)
RBC: 4.68 Mil/uL (ref 3.87–5.11)
RDW: 14 % (ref 11.5–15.5)
WBC: 6.8 10*3/uL (ref 4.0–10.5)

## 2017-02-14 LAB — LIPID PANEL
Cholesterol: 224 mg/dL — ABNORMAL HIGH (ref 0–200)
HDL: 56.6 mg/dL (ref >39.00)
LDL Cholesterol: 131 mg/dL — ABNORMAL HIGH (ref 0–99)
NONHDL: 167.81
Total CHOL/HDL Ratio: 4
Triglycerides: 182 mg/dL — ABNORMAL HIGH (ref 0.0–149.0)
VLDL: 36.4 mg/dL (ref 0.0–40.0)

## 2017-02-14 LAB — COMPREHENSIVE METABOLIC PANEL
ALK PHOS: 78 U/L (ref 39–117)
ALT: 31 U/L (ref 0–35)
AST: 29 U/L (ref 0–37)
Albumin: 4.1 g/dL (ref 3.5–5.2)
BILIRUBIN TOTAL: 0.4 mg/dL (ref 0.2–1.2)
BUN: 11 mg/dL (ref 6–23)
CO2: 28 mEq/L (ref 19–32)
Calcium: 9.4 mg/dL (ref 8.4–10.5)
Chloride: 101 mEq/L (ref 96–112)
Creatinine, Ser: 0.86 mg/dL (ref 0.40–1.20)
GFR: 71.38 mL/min (ref >60.00)
GLUCOSE: 105 mg/dL — AB (ref 70–99)
POTASSIUM: 4.6 meq/L (ref 3.5–5.1)
SODIUM: 137 meq/L (ref 135–145)
TOTAL PROTEIN: 6.7 g/dL (ref 6.0–8.3)

## 2017-02-14 LAB — VITAMIN D 25 HYDROXY (VIT D DEFICIENCY, FRACTURES): VITD: 48.54 ng/mL (ref 30.00–100.00)

## 2017-02-14 LAB — TSH: TSH: 3.13 u[IU]/mL (ref 0.35–4.50)

## 2017-02-14 MED ORDER — TRIAMCINOLONE ACETONIDE 0.1 % EX CREA: 1.0000 "application " | TOPICAL_CREAM | Freq: Two times a day (BID) | CUTANEOUS | 0 refills | Status: DC

## 2017-02-14 MED ORDER — TRIAMCINOLONE ACETONIDE 0.1 % EX CREA
1.0000 | TOPICAL_CREAM | Freq: Two times a day (BID) | CUTANEOUS | 0 refills | Status: DC
Start: 2017-02-14 — End: 2017-02-14

## 2017-02-14 MED ORDER — BETAMETHASONE VALERATE 0.1 % EX OINT: TOPICAL_OINTMENT | CUTANEOUS | 0 refills | Status: DC

## 2017-02-14 NOTE — Progress Notes (Signed)
GYNECOLOGY  VISIT   HPI: 62 y.o.   Married  Caucasian  female   G1P1001 with Patient's last menstrual period was 01/31/2002 (approximate).   here c/o vaginal irritation and a painful vaginal "bump". She noticed a lump in the lateral right vulva, crease of her leg. Not draining, tender and red, getting bigger. Has tried warm compresses. She has one on the mons that comes and goes, not bothering her right now. No fevers. She also c/o some vulvar itching over the last 3 weeks and irritation/tear above her clitoris. She doesn't leak urine, but she does sweat, changes her underwear. No c/o vaginal d/c.   GYNECOLOGIC HISTORY: Patient's last menstrual period was 01/31/2002 (approximate). Contraception:postmenopause  Menopausal hormone therapy: progesterone/ estradiol         OB History    Gravida Para Term Preterm AB Living   _0 0 0 1   SAB TAB Ectopic Multiple Live Births   0 0 0 0 1         Patient Active Problem List   Diagnosis Date Noted  . Easy bruising 07/18/2016  . Hypothyroidism 07/15/2016  . Bipolar 2 disorder ([place])   . Ischemic colitis ([place])   . Anemia, unspecified 04/18/2012  . Ulcerative colitis ([place]) 02/01/2012    Past Medical History:  Diagnosis Date  . Anemia, unspecified 04/18/2012  . Bipolar 2 disorder ([place])   . History of chickenpox   . Hypercholesteremia   . Hypothyroidism 02/01/2012  . Ischemic colitis ([place]) 2010  . Menopause   . Migraine   . Ulcerative colitis ([place]) 02/01/2012    Past Surgical History:  Procedure Laterality Date  . CATARACT EXTRACTION W/ INTRAOCULAR LENS IMPLANT Left 03/14/2016  . CATARACT EXTRACTION W/ INTRAOCULAR LENS IMPLANT Right 04/18/2016  . NASAL SEPTUM SURGERY      Current Outpatient Medications  Medication Sig Dispense Refill  . belladonna alk-PHENObarbital (DONNATAL) 16.2 MG tablet Take 1 tablet by mouth as needed.    . clonazePAM (KLONOPIN) 0.5 MG tablet Take 1 tablet by mouth daily.    Marland Kitchen estradiol (CLIMARA - DOSED IN  MG/24 HR) 0.0375 mg/24hr patch Place 1 patch (0.0375 mg total) onto the skin once a week. 12 patch 1  . fluticasone (FLONASE) 50 MCG/ACT nasal spray Place 2 sprays into the nose daily as needed.    . lamoTRIgine (LAMICTAL) 200 MG tablet Take 200 mg by mouth daily.    Marland Kitchen LATUDA 80 MG TABS tablet Take 1 tablet by mouth daily.    Marland Kitchen loperamide (IMODIUM) 2 MG capsule Take 2 mg by mouth 4 (four) times daily as needed for diarrhea or loose stools.    Marland Kitchen loratadine (CLARITIN) 10 MG tablet Take 10 mg by mouth daily.    Marland Kitchen NEXIUM 40 MG capsule     . progesterone (PROMETRIUM) 100 MG capsule Take 1 capsule (100 mg total) by mouth daily. 90 capsule 4  . propranolol (INDERAL) 20 MG tablet Take 1 tablet by mouth daily as needed for anxiety.    . SYNTHROID 112 MCG tablet TAKE 1 TABLET DAILY 90 tablet 0  . Vitamin D, Ergocalciferol, (DRISDOL) 50000 units CAPS capsule Take 1 capsule (50,000 Units total) once a week by mouth. Vitamin D 12 capsule 0   No current facility-administered medications for this visit.      ALLERGIES: Penicillins; Sulfa antibiotics; and Xylocaine [lidocaine]  Family History  Problem Relation Age of Onset  . Colon cancer Father 78       Colon  .  Hypertension Father   . Breast cancer Maternal Grandmother   . Heart failure Mother   . Osteoarthritis Mother   . Depression Mother   . Hypertension Mother   . Stroke Mother   . Cancer Paternal Grandmother        skin cancer, nose  . Heart attack Paternal Grandfather   . Cancer Maternal Grandfather        lung    Social History   Socioeconomic History  . Marital status: Married    Spouse name: Not on file  . Number of children: 1  . Years of education: BA  . Highest education level: Not on file  Social Needs  . Financial resource strain: Not on file  . Food insecurity - worry: Not on file  . Food insecurity - inability: Not on file  . Transportation needs - medical: Not on file  . Transportation needs - non-medical: Not on  file  Occupational History  . Occupation: N/A  Tobacco Use  . Smoking status: Former Smoker    Last attempt to quit: 04/25/1991    Years since quitting: 25.8  . Smokeless tobacco: Never Used  Substance and Sexual Activity  . Alcohol use: Yes    Alcohol/week: 3.6 oz    Types: 6 Standard drinks or equivalent per week    Comment: alcohol about 6 drinks a week  . Drug use: No  . Sexual activity: Yes    Partners: Male    Birth control/protection: Post-menopausal  Other Topics Concern  . Not on file  Social History Narrative   2 cups of coffee a day, occasional tea     Review of Systems  Constitutional: Negative.   HENT: Negative.   Eyes: Negative.   Respiratory: Negative.   Cardiovascular: Negative.   Gastrointestinal: Negative.   Genitourinary:       Vaginal irritation Painful vaginal "bump"  Musculoskeletal: Negative.   Skin: Negative.   Neurological: Negative.   Endo/Heme/Allergies: Negative.   Psychiatric/Behavioral: Negative.     PHYSICAL EXAMINATION:    BP 138/80 (BP Location: Right Arm, Patient Position: Sitting, Cuff Size: Normal)   Pulse 88   Resp 14   Wt 238 lb (108 kg)   LMP 01/31/2002 (Approximate)   BMI 37.28 kg/m     General appearance: alert, cooperative and appears stated age  Pelvic: External genitalia:  no lesions, she has a small boil under 1 cm on her right upper thigh. She has a fissure above her clitoris with mild erythema. No agglutination or whitening. Healing boil on the left mons 5 mm              Urethra:  normal appearing urethra with no masses, tenderness or lesions              Bartholins and Skenes: normal                 Vagina: normal appearing vagina with a slight increase in watery/creamy white vaginal d/c, no lesions              Cervix:no lesions  Chaperone was present for exam.  ASSESSMENT Skin boil under one cm on the upper right inner thigh Vulvitis Slight increase in vaginal d/c    PLAN Warm compresses/hot soaks to  the boil, return if getting larger Affirm sent Treat vulvitis (not the boil) with steroid ointment for 1-2 weeks Discussed use of Vaseline, changing underwear if damp and vulvar skin care in general. Information given  An After Visit Summary was printed and given to the patient.  Addendum: she does notice that her vulvar skin issues have occurred since she has decreased her HRT, will discuss further at her annual in 4/19

## 2017-02-14 NOTE — Addendum Note (Signed)
Addended by: Leonidas Romberg on: 02/14/2017 02:23 PM   Modules accepted: Orders

## 2017-02-14 NOTE — Progress Notes (Signed)
Patient presents to clinic today for annual exam.  Patient is fasting for labs.  Acute Concerns: Patient noting rash on forearms over the past several months. Is not painful and does not itch. States it seems like small pustules. Denies any new lesions developing.  Chronic Issues: Hypothyroidism -- Is currently on a regimen of synthroid 112 mcg daily. Is taking as directed. Denies change to temperature tolerance, bowel habits, energy level or weight.   Ulcerative Colitis -- Followed by GI - Dr. Collene Mares. Colonoscopy this past Friday. Awaiting results of polyp pathology.   Bipolar II -- Followed by Psychiatry. Endorses doing very well on current regimen. Marland Kitchen   Health Maintenance: Immunizations -- up-to-date. Colonoscopy -- Last Friday. Awaiting results. History of IC. Mammogram -- Repeat due in February 2019. Is followed by GYN. PAP -- Followed by GYN. Had appointment this morning. Up-to-date.   Past Medical History:  Diagnosis Date  . Anemia, unspecified 04/18/2012  . Bipolar 2 disorder ([place])   . History of chickenpox   . Hypercholesteremia   . Hypothyroidism 02/01/2012  . Ischemic colitis ([place]) 2010  . Menopause   . Migraine   . Ulcerative colitis ([place]) 02/01/2012    Past Surgical History:  Procedure Laterality Date  . CATARACT EXTRACTION W/ INTRAOCULAR LENS IMPLANT Left 03/14/2016  . CATARACT EXTRACTION W/ INTRAOCULAR LENS IMPLANT Right 04/18/2016  . NASAL SEPTUM SURGERY      Current Outpatient Medications on File Prior to Visit  Medication Sig Dispense Refill  . belladonna alk-PHENObarbital (DONNATAL) 16.2 MG tablet Take 1 tablet by mouth as needed.    . clonazePAM (KLONOPIN) 0.5 MG tablet Take 1 tablet by mouth daily.    Marland Kitchen estradiol (CLIMARA - DOSED IN MG/24 HR) 0.0375 mg/24hr patch Place 1 patch (0.0375 mg total) onto the skin once a week. 12 patch 1  . fluticasone (FLONASE) 50 MCG/ACT nasal spray Place 2 sprays into the nose daily as needed.    . LamoTRIgine 100 MG TBDP  Take 100 mg by mouth daily.     Marland Kitchen LATUDA 80 MG TABS tablet Take 1 tablet by mouth daily.    Marland Kitchen loperamide (IMODIUM) 2 MG capsule Take 2 mg by mouth 4 (four) times daily as needed for diarrhea or loose stools.    Marland Kitchen loratadine (CLARITIN) 10 MG tablet Take 10 mg by mouth daily.    Marland Kitchen NEXIUM 40 MG capsule Take 40 mg by mouth daily at 12 noon.     . progesterone (PROMETRIUM) 100 MG capsule Take 1 capsule (100 mg total) by mouth daily. 90 capsule 4  . propranolol (INDERAL) 20 MG tablet Take 1 tablet by mouth daily as needed for anxiety.    . SYNTHROID 112 MCG tablet TAKE 1 TABLET DAILY 90 tablet 0  . vitamin B-12 (CYANOCOBALAMIN) 1000 MCG tablet Take 1,000 mcg by mouth daily.    . Vitamin D, Ergocalciferol, (DRISDOL) 50000 units CAPS capsule Take 1 capsule (50,000 Units total) once a week by mouth. Vitamin D 12 capsule 0   No current facility-administered medications on file prior to visit.     Allergies  Allergen Reactions  . Penicillins Hives  . Sulfa Antibiotics Hives  . Xylocaine [Lidocaine] Other (See Comments)    Doesn't numb    Family History  Problem Relation Age of Onset  . Colon cancer Father 85       Colon  . Hypertension Father   . Breast cancer Maternal Grandmother   . Heart failure Mother   .  Osteoarthritis Mother   . Depression Mother   . Hypertension Mother   . Stroke Mother   . Cancer Paternal Grandmother        skin cancer, nose  . Heart attack Paternal Grandfather   . Cancer Maternal Grandfather        lung    Social History   Socioeconomic History  . Marital status: Married    Spouse name: Not on file  . Number of children: 1  . Years of education: BA  . Highest education level: Not on file  Social Needs  . Financial resource strain: Not on file  . Food insecurity - worry: Not on file  . Food insecurity - inability: Not on file  . Transportation needs - medical: Not on file  . Transportation needs - non-medical: Not on file  Occupational History  .  Occupation: N/A  Tobacco Use  . Smoking status: Former Smoker    Last attempt to quit: 04/25/1991    Years since quitting: 25.8  . Smokeless tobacco: Never Used  Substance and Sexual Activity  . Alcohol use: Yes    Alcohol/week: 3.6 oz    Types: 6 Standard drinks or equivalent per week    Comment: alcohol about 6 drinks a week  . Drug use: No  . Sexual activity: Yes    Partners: Male    Birth control/protection: Post-menopausal  Other Topics Concern  . Not on file  Social History Narrative   2 cups of coffee a day, occasional tea    Review of Systems  Constitutional: Negative for fever and weight loss.  HENT: Negative for ear discharge, ear pain, hearing loss and tinnitus.   Eyes: Negative for blurred vision, double vision, photophobia and pain.  Respiratory: Negative for cough and shortness of breath.   Cardiovascular: Negative for chest pain and palpitations.  Gastrointestinal: Positive for diarrhea (chronic secondary to UC). Negative for abdominal pain, blood in stool, constipation, heartburn, melena, nausea and vomiting.  Genitourinary: Negative for dysuria, flank pain, frequency, hematuria and urgency.  Musculoskeletal: Negative for falls.  Skin: Positive for rash. Negative for itching.  Neurological: Negative for dizziness, loss of consciousness and headaches.  Endo/Heme/Allergies: Negative for environmental allergies.  Psychiatric/Behavioral: Negative for depression, hallucinations, substance abuse and suicidal ideas. The patient is not nervous/anxious and does not have insomnia.    BP 120/82   Pulse 63   Temp 97.8 F (36.6 C) (Oral)   Resp 14   Ht 5' 7"  (1.702 m)   Wt 234 lb (106.1 kg)   LMP 01/31/2002 (Approximate)   SpO2 100%   BMI 36.65 kg/m   Physical Exam  Constitutional: She is oriented to person, place, and time and well-developed, well-nourished, and in no distress.  HENT:  Head: Normocephalic and atraumatic.  Right Ear: Tympanic membrane, external ear  and ear canal normal.  Left Ear: Tympanic membrane, external ear and ear canal normal.  Nose: Nose normal. No mucosal edema.  Mouth/Throat: Uvula is midline, oropharynx is clear and moist and mucous membranes are normal. No oropharyngeal exudate or posterior oropharyngeal erythema.  Eyes: Conjunctivae are normal. Pupils are equal, round, and reactive to light.  Neck: Neck supple. No thyromegaly present.  Cardiovascular: Normal rate, regular rhythm, normal heart sounds and intact distal pulses.  Pulmonary/Chest: Effort normal and breath sounds normal. No respiratory distress. She has no wheezes. She has no rales.  Abdominal: Soft. Bowel sounds are normal. She exhibits no distension and no mass. There is no tenderness. There  is no rebound and no guarding.  Lymphadenopathy:    She has no cervical adenopathy.  Neurological: She is alert and oriented to person, place, and time. No cranial nerve deficit.  Skin: Skin is warm and dry. Rash noted. Rash is pustular (clustered pustular lesions of left forearm, seem cosnsitent with a pusutular psoriasis.).  Psychiatric: Affect normal.  Vitals reviewed.  Recent Results (from the past 2160 hour(s))  VITAMIN D 25 Hydroxy (Vit-D Deficiency, Fractures)     Status: None   Collection Time: 12/01/16  3:34 PM  Result Value Ref Range   VITD 45.25 30.00 - 100.00 ng/mL  HM COLONOSCOPY     Status: None   Collection Time: 02/10/17 12:00 AM  Result Value Ref Range   [facility] Colonoscopy See Report (in chart) See Report (in chart), Patient Reported    Assessment/Plan: 1. Bipolar 2 disorder ([place]) Doing very well. Continue care as directed by specialist.  2. Other ulcerative colitis without complication ([place]) Followed by Gastroenterology. Had colonoscopy last Friday. Pathology pending. Continue care as directed by GI.  3. Visit for preventive health examination Depression screen negative. Health Maintenance reviewed -- Immunizations up-to-date.  Due for repeat  Mammogram in February. Is scheduling. Preventive schedule discussed and handout given in AVS. Will obtain fasting labs today.  - CBC with Differential/Platelet - Comprehensive metabolic panel - Lipid panel  4. Hypothyroidism, unspecified type Repeat TSH level today. - TSH  5. Vitamin D deficiency Repeat level today - Vitamin D (25 hydroxy)  6. Rash Seems potential pustular psoriasis. Will start topical steroid. Follow-up discussed.  - triamcinolone cream (KENALOG) 0.1 %; Apply 1 application topically 2 (two) times daily.  Dispense: 30 g; Refill: 0   Leeanne Rio, PA-C

## 2017-02-14 NOTE — Patient Instructions (Signed)
Please go to the lab for blood work.   Our office will call you with your results unless you have chosen to receive results via MyChart.  If your blood work is normal we will follow-up each year for physicals and as scheduled for chronic medical problems.  If anything is abnormal we will treat accordingly and get you in for a follow-up.  Apply cream as directed twice daily for 2 weeks. If not improving we will need dermatology assessment.   Preventive Care 40-64 Years, Female Preventive care refers to lifestyle choices and visits with your health care provider that can promote health and wellness. What does preventive care include?  A yearly physical exam. This is also called an annual well check.  Dental exams once or twice a year.  Routine eye exams. Ask your health care provider how often you should have your eyes checked.  Personal lifestyle choices, including: ? Daily care of your teeth and gums. ? Regular physical activity. ? Eating a healthy diet. ? Avoiding tobacco and drug use. ? Limiting alcohol use. ? Practicing safe sex. ? Taking low-dose aspirin daily starting at age 13. ? Taking vitamin and mineral supplements as recommended by your health care provider. What happens during an annual well check? The services and screenings done by your health care provider during your annual well check will depend on your age, overall health, lifestyle risk factors, and family history of disease. Counseling Your health care provider may ask you questions about your:  Alcohol use.  Tobacco use.  Drug use.  Emotional well-being.  Home and relationship well-being.  Sexual activity.  Eating habits.  Work and work Statistician.  Method of birth control.  Menstrual cycle.  Pregnancy history.  Screening You may have the following tests or measurements:  Height, weight, and BMI.  Blood pressure.  Lipid and cholesterol levels. These may be checked every 5 years, or  more frequently if you are over 17 years old.  Skin check.  Lung cancer screening. You may have this screening every year starting at age 73 if you have a 30-pack-year history of smoking and currently smoke or have quit within the past 15 years.  Fecal occult blood test (FOBT) of the stool. You may have this test every year starting at age 73.  Flexible sigmoidoscopy or colonoscopy. You may have a sigmoidoscopy every 5 years or a colonoscopy every 10 years starting at age 49.  Hepatitis C blood test.  Hepatitis B blood test.  Sexually transmitted disease (STD) testing.  Diabetes screening. This is done by checking your blood sugar (glucose) after you have not eaten for a while (fasting). You may have this done every 1-3 years.  Mammogram. This may be done every 1-2 years. Talk to your health care provider about when you should start having regular mammograms. This may depend on whether you have a family history of breast cancer.  BRCA-related cancer screening. This may be done if you have a family history of breast, ovarian, tubal, or peritoneal cancers.  Pelvic exam and Pap test. This may be done every 3 years starting at age 68. Starting at age 54, this may be done every 5 years if you have a Pap test in combination with an HPV test.  Bone density scan. This is done to screen for osteoporosis. You may have this scan if you are at high risk for osteoporosis.  Discuss your test results, treatment options, and if necessary, the need for more tests with your  health care provider. Vaccines Your health care provider may recommend certain vaccines, such as:  Influenza vaccine. This is recommended every year.  Tetanus, diphtheria, and acellular pertussis (Tdap, Td) vaccine. You may need a Td booster every 10 years.  Varicella vaccine. You may need this if you have not been vaccinated.  Zoster vaccine. You may need this after age 54.  Measles, mumps, and rubella (MMR) vaccine. You may  need at least one dose of MMR if you were born in 1957 or later. You may also need a second dose.  Pneumococcal 13-valent conjugate (PCV13) vaccine. You may need this if you have certain conditions and were not previously vaccinated.  Pneumococcal polysaccharide (PPSV23) vaccine. You may need one or two doses if you smoke cigarettes or if you have certain conditions.  Meningococcal vaccine. You may need this if you have certain conditions.  Hepatitis A vaccine. You may need this if you have certain conditions or if you travel or work in places where you may be exposed to hepatitis A.  Hepatitis B vaccine. You may need this if you have certain conditions or if you travel or work in places where you may be exposed to hepatitis B.  Haemophilus influenzae type b (Hib) vaccine. You may need this if you have certain conditions.  Talk to your health care provider about which screenings and vaccines you need and how often you need them. This information is not intended to replace advice given to you by your health care provider. Make sure you discuss any questions you have with your health care provider. Document Released: 02/13/2015 Document Revised: 10/07/2015 Document Reviewed: 11/18/2014 Elsevier Interactive Patient Education  [employer]. .

## 2017-02-14 NOTE — Patient Instructions (Signed)

## 2017-02-15 ENCOUNTER — Other Ambulatory Visit (INDEPENDENT_AMBULATORY_CARE_PROVIDER_SITE_OTHER): Payer: BLUE CROSS/BLUE SHIELD

## 2017-02-15 DIAGNOSIS — R7309 Other abnormal glucose: Secondary | ICD-10-CM | POA: Diagnosis not present

## 2017-02-15 LAB — VAGINITIS/VAGINOSIS, DNA PROBE
CANDIDA SPECIES: NEGATIVE
GARDNERELLA VAGINALIS: NEGATIVE
TRICHOMONAS VAG: NEGATIVE

## 2017-02-15 LAB — HEMOGLOBIN A1C: HEMOGLOBIN A1C: 5.5 % (ref 4.6–6.5)

## 2017-02-28 ENCOUNTER — Other Ambulatory Visit: Payer: Self-pay | Admitting: Physician Assistant

## 2017-02-28 DIAGNOSIS — E559 Vitamin D deficiency, unspecified: Secondary | ICD-10-CM

## 2017-03-01 ENCOUNTER — Encounter: Payer: Self-pay | Admitting: Physician Assistant

## 2017-03-06 ENCOUNTER — Ambulatory Visit
Admission: RE | Admit: 2017-03-06 | Discharge: 2017-03-06 | Disposition: A | Discharge status: Home / Self Care | Payer: BLUE CROSS/BLUE SHIELD | Source: Ambulatory Visit | Attending: Certified Nurse Midwife | Admitting: Certified Nurse Midwife

## 2017-03-06 DIAGNOSIS — Z1231 Encounter for screening mammogram for malignant neoplasm of breast: Secondary | ICD-10-CM | POA: Diagnosis not present

## 2017-03-23 DIAGNOSIS — G4733 Obstructive sleep apnea (adult) (pediatric): Secondary | ICD-10-CM | POA: Diagnosis not present

## 2017-04-09 ENCOUNTER — Other Ambulatory Visit: Payer: Self-pay | Admitting: Obstetrics and Gynecology

## 2017-04-18 DIAGNOSIS — F3132 Bipolar disorder, current episode depressed, moderate: Secondary | ICD-10-CM | POA: Diagnosis not present

## 2017-05-02 ENCOUNTER — Telehealth: Payer: Self-pay | Admitting: Physician Assistant

## 2017-05-02 DIAGNOSIS — J310 Chronic rhinitis: Secondary | ICD-10-CM

## 2017-05-02 NOTE — Telephone Encounter (Signed)
Pt aware.

## 2017-05-02 NOTE — Telephone Encounter (Signed)
Copied from [place]. Topic: Quick Communication - See Telephone Encounter >> May 02, 2017 12:10 PM [name] wrote: CRM for notification.   Pt. Is requesting referral to allergist  See Telephone encounter for: 05/02/17.

## 2017-05-02 NOTE — Telephone Encounter (Signed)
Referral placed.

## 2017-05-08 ENCOUNTER — Other Ambulatory Visit: Payer: Self-pay | Admitting: Physician Assistant

## 2017-05-09 DIAGNOSIS — F411 Generalized anxiety disorder: Secondary | ICD-10-CM | POA: Diagnosis not present

## 2017-05-09 DIAGNOSIS — F3132 Bipolar disorder, current episode depressed, moderate: Secondary | ICD-10-CM | POA: Diagnosis not present

## 2017-05-16 ENCOUNTER — Ambulatory Visit: Payer: BLUE CROSS/BLUE SHIELD | Admitting: Nurse Practitioner

## 2017-05-17 ENCOUNTER — Other Ambulatory Visit: Payer: Self-pay

## 2017-05-17 ENCOUNTER — Ambulatory Visit: Payer: BLUE CROSS/BLUE SHIELD | Admitting: Certified Nurse Midwife

## 2017-05-17 ENCOUNTER — Encounter: Payer: Self-pay | Admitting: Certified Nurse Midwife

## 2017-05-17 VITALS — BP 124/82 | HR 70 | Resp 16 | Ht 66.25 in | Wt 232.0 lb

## 2017-05-17 DIAGNOSIS — Z01419 Encounter for gynecological examination (general) (routine) without abnormal findings: Secondary | ICD-10-CM

## 2017-05-17 DIAGNOSIS — N951 Menopausal and female climacteric states: Secondary | ICD-10-CM | POA: Diagnosis not present

## 2017-05-17 DIAGNOSIS — Z79890 Hormone replacement therapy (postmenopausal): Secondary | ICD-10-CM | POA: Diagnosis not present

## 2017-05-17 NOTE — Progress Notes (Signed)
62 y.o. G42P1001 Married  Caucasian Fe here for annual exam. Menopausal on HRT and having no symptoms now of hot flashes or night sweats or vaginal dryness. Would like to stop HRT. "Feels she has been on long enough" Sees PCP yearly aex, labs, Hypothyroid, cholesterol, anxiety management.  Has been working on weight loss with some success. Colonoscopy due again this year. No other health issues today.  Patient's last menstrual period was 01/31/2002 (approximate).          Sexually active: Yes.    The current method of family planning is vasectomy.    Exercising: Yes.    dvd, light aerobics. stength Smoker:  no  Health Maintenance: Pap:  05-12-15 neg HPV HR neg History of Abnormal Pap: no MMG:  03-06-17 category b density birads 1:neg Self Breast exams: no Colonoscopy:  2019 f/u 6/19 BMD:   2018  Normal per report TDaP:  2014 Shingles: no Pneumonia: no Hep C and HIV: both neg 2017 Labs: with PCP   reports that she quit smoking about 26 years ago. She has never used smokeless tobacco. She reports that she drinks about 3.6 oz of alcohol per week. She reports that she does not use drugs.  Past Medical History:  Diagnosis Date  . Anemia, unspecified 04/18/2012  . Anxiety   . Bipolar 2 disorder ([place])   . Depression   . History of chickenpox   . Hypercholesteremia   . Hypothyroidism 02/01/2012  . Ischemic colitis ([place]) 2010  . Menopause   . Migraine   . Ulcerative colitis ([place]) 02/01/2012    Past Surgical History:  Procedure Laterality Date  . CATARACT EXTRACTION W/ INTRAOCULAR LENS IMPLANT Left 03/14/2016  . CATARACT EXTRACTION W/ INTRAOCULAR LENS IMPLANT Right 04/18/2016  . NASAL SEPTUM SURGERY      Current Outpatient Medications  Medication Sig Dispense Refill  . belladonna alk-PHENObarbital (DONNATAL) 16.2 MG tablet Take 1 tablet by mouth as needed.    . Cholecalciferol (VITAMIN D PO) Take by mouth.    . clonazePAM (KLONOPIN) 0.5 MG tablet Take 1 tablet by mouth daily.    Marland Kitchen  estradiol (CLIMARA - DOSED IN MG/24 HR) 0.0375 mg/24hr patch Place 1 patch (0.0375 mg total) onto the skin once a week. 12 patch 1  . fluticasone (FLONASE) 50 MCG/ACT nasal spray Place 2 sprays into the nose daily as needed.    . LamoTRIgine 100 MG TBDP Take 100 mg by mouth daily.     Marland Kitchen LATUDA 80 MG TABS tablet Take 1 tablet by mouth daily.    Marland Kitchen loperamide (IMODIUM) 2 MG capsule Take 2 mg by mouth 4 (four) times daily as needed for diarrhea or loose stools.    Marland Kitchen loratadine (CLARITIN) 10 MG tablet Take 10 mg by mouth daily.    Marland Kitchen NEXIUM 40 MG capsule Take 40 mg by mouth daily at 12 noon.     . progesterone (PROMETRIUM) 100 MG capsule Take 1 capsule (100 mg total) by mouth daily. 90 capsule 4  . propranolol (INDERAL) 20 MG tablet Take 1 tablet by mouth 2 (two) times daily.     Marland Kitchen SYNTHROID 112 MCG tablet TAKE 1 TABLET DAILY 90 tablet 1  . vitamin B-12 (CYANOCOBALAMIN) 1000 MCG tablet Take 1,000 mcg by mouth daily.     No current facility-administered medications for this visit.     Family History  Problem Relation Age of Onset  . Colon cancer Father 67       Colon  . Hypertension  Father   . Breast cancer Maternal Grandmother   . Heart failure Mother   . Osteoarthritis Mother   . Depression Mother   . Hypertension Mother   . Stroke Mother   . Cancer Paternal Grandmother        skin cancer, nose  . Heart attack Paternal Grandfather   . Cancer Maternal Grandfather        lung    ROS:  Pertinent items are noted in HPI.  Otherwise, a comprehensive ROS was negative.  Exam:   BP 124/82   Pulse 70   Resp 16   Ht 5' 6.25" (1.683 m)   Wt 232 lb (105.2 kg)   LMP 01/31/2002 (Approximate)   BMI 37.16 kg/m  Height: 5' 6.25" (168.3 cm) Ht Readings from Last 3 Encounters:  05/17/17 5' 6.25" (1.683 m)  02/14/17 5' 7"  (1.702 m)  09/09/16 5' 7"  (1.702 m)    General appearance: alert, cooperative and appears stated age Head: Normocephalic, without obvious abnormality, atraumatic Neck: no  adenopathy, supple, symmetrical, trachea midline and thyroid normal to inspection and palpation Lungs: clear to auscultation bilaterally Breasts: normal appearance, no masses or tenderness, No nipple retraction or dimpling, No nipple discharge or bleeding, No axillary or supraclavicular adenopathy, large pendulous Heart: regular rate and rhythm Abdomen: soft, non-tender; no masses,  no organomegaly Extremities: extremities normal, atraumatic, no cyanosis or edema Skin: Skin color, texture, turgor normal. No rashes or lesions Lymph nodes: Cervical, supraclavicular, and axillary nodes normal. No abnormal inguinal nodes palpated Neurologic: Grossly normal   Pelvic: External genitalia:  no lesions,normal female              Urethra:  normal appearing urethra with no masses, tenderness or lesions              Bartholin's and Skene's: normal                 Vagina: normal appearing vagina with normal color and discharge, no lesions or atrophy              Cervix: no cervical motion tenderness, no lesions and normal appearance              Pap taken: No. Bimanual Exam:  Uterus:  normal size, contour, position, consistency, mobility, non-tender              Adnexa: normal adnexa and no mass, fullness, tenderness               Rectovaginal: Confirms               Anus:  normal sphincter tone, no lesions  Chaperone present: yes  A:  Well Woman with normal exam  Menopausal on HRT, desires to discontinue  Obesity working with diet and weight loss  Cholesterol/hypothyroid/depression with PCP management, all medications stable per patient  P:   Reviewed health and wellness pertinent to exam  Discussed risks/benefits of HRT. Patient desires to stop. Expectations discussed with night sweats and hot flashes or sleep changes. She feels she will do fine. Discussed using 1/2 patch weekly with same dosage of Prometrium. When she changes extend the number of days she wears to wean off in 1-2 months. Has Rx  for 1-2 months. Will advise if problems. No Rx given  Continue to work on weight loss for better health.  Continue follow up with PCP as  Indicated.  Pap smear: no   counseled on breast self exam, mammography screening, feminine hygiene, use  and side effects of HRT, adequate intake of calcium and vitamin D, diet and exercise  return annually or prn  An After Visit Summary was printed and given to the patient.

## 2017-05-17 NOTE — Patient Instructions (Signed)

## 2017-05-18 ENCOUNTER — Encounter: Payer: Self-pay | Admitting: Allergy & Immunology

## 2017-05-18 ENCOUNTER — Other Ambulatory Visit: Payer: Self-pay | Admitting: Obstetrics and Gynecology

## 2017-05-18 ENCOUNTER — Telehealth: Payer: Self-pay | Admitting: *Deleted

## 2017-05-18 ENCOUNTER — Ambulatory Visit: Payer: BLUE CROSS/BLUE SHIELD | Admitting: Allergy & Immunology

## 2017-05-18 ENCOUNTER — Encounter: Payer: Self-pay | Admitting: Certified Nurse Midwife

## 2017-05-18 VITALS — BP 140/84 | HR 80 | Temp 98.2°F | Resp 16 | Ht 66.0 in | Wt 232.4 lb

## 2017-05-18 DIAGNOSIS — J302 Other seasonal allergic rhinitis: Secondary | ICD-10-CM

## 2017-05-18 DIAGNOSIS — K51919 Ulcerative colitis, unspecified with unspecified complications: Secondary | ICD-10-CM

## 2017-05-18 DIAGNOSIS — J3089 Other allergic rhinitis: Secondary | ICD-10-CM

## 2017-05-18 MED ORDER — ESTRADIOL 0.0375 MG/24HR TD PTWK: 0.0375 mg | MEDICATED_PATCH | TRANSDERMAL | 0 refills | Status: DC

## 2017-05-18 MED ORDER — FLUTICASONE PROPIONATE 93 MCG/ACT NA EXHU: 2.0000 | INHALANT_SUSPENSION | Freq: Two times a day (BID) | NASAL | 5 refills | Status: DC

## 2017-05-18 NOTE — Patient Instructions (Addendum)
1. Chronic rhinitis - Testing today showed: trees, weeds, indoor molds, outdoor molds and dust mites - Avoidance measures provided. - Stop taking: Flonase - Continue with: Claritin (loratadine) 88m tablet once daily - Start taking: Xhance (fluticasone) 1-2 sprays per nostril twice daily - You can use an extra dose of the antihistamine, if needed, for breakthrough symptoms.  - Consider nasal saline rinses 1-2 times daily to remove allergens from the nasal cavities as well as help with mucous clearance (this is especially helpful to do before the nasal sprays are given) - Consider allergy shots as a means of long-term control. - Allergy shots "re-train" and "reset" the immune system to ignore environmental allergens and decrease the resulting immune response to those allergens (sneezing, itchy watery eyes, runny nose, nasal congestion, etc).    - Allergy shots improve symptoms in 75-85% of patients.  - We can discuss more at the next appointment if the medications are not working for you.  2. Ulcerative colitis - Food testing is not indicated for ulcerative colitis. - Our food testing looks at reactions consistent with anaphylaxis (wheezing, vomiting, swelling, throat swelling, etc).  - There is no connection between skin testing for foods and identifying triggering foods for ulcerative colitis. - This would be a waste of money.  3. Return in about 6 months (around 11/17/2017).   Please inform [place] any Emergency Department visits, hospitalizations, or changes in symptoms. Call [place]efore going to the ED for breathing or allergy symptoms since we might be able to fit you in for a sick visit. Feel free to contact u[place]anytime with any questions, problems, or concerns.  It was a pleasure to meet you today!  Websites that have reliable patient information: 1. American Academy of Asthma, Allergy, and Immunology: www.aaaai.org 2. Food Allergy Research and Education (FARE): foodallergy.org 3.  Mothers of Asthmatics: http://www.asthmacommunitynetwork.org 4. American College of Allergy, Asthma, and Immunology: www.acaai.org  Reducing Pollen Exposure  The American Academy of Allergy, Asthma and Immunology suggests the following steps to reduce your exposure to pollen during allergy seasons.    1. Do not hang sheets or clothing out to dry; pollen may collect on these items. 2. Do not mow lawns or spend time around freshly cut grass; mowing stirs up pollen. 3. Keep windows closed at night.  Keep car windows closed while driving. 4. Minimize morning activities outdoors, a time when pollen counts are usually at their highest. 5. Stay indoors as much as possible when pollen counts or humidity is high and on windy days when pollen tends to remain in the air longer. 6. Use air conditioning when possible.  Many air conditioners have filters that trap the pollen spores. 7. Use a HEPA room air filter to remove pollen form the indoor air you breathe.  Control of Mold Allergen   Mold and fungi can grow on a variety of surfaces provided certain temperature and moisture conditions exist.  Outdoor molds grow on plants, decaying vegetation and soil.  The major outdoor mold, Alternaria and Cladosporium, are found in very high numbers during hot and dry conditions.  Generally, a late Summer - Fall peak is seen for common outdoor fungal spores.  Rain will temporarily lower outdoor mold spore count, but counts rise rapidly when the rainy period ends.  The most important indoor molds are Aspergillus and Penicillium.  Dark, humid and poorly ventilated basements are ideal sites for mold growth.  The next most common sites of mold growth are the bathroom and  the kitchen.  Outdoor (Seasonal) Mold Control  Positive outdoor molds via skin testing: Bipolaris (Helminthsporium), Drechslera (Curvalaria) and Mucor  1. Use air conditioning and keep windows closed 2. Avoid exposure to decaying vegetation. 3. Avoid  leaf raking. 4. Avoid grain handling. 5. Consider wearing a face mask if working in moldy areas.  6.   Indoor (Perennial) Mold Control   Positive indoor molds via skin testing: Aspergillus, Penicillium, Fusarium, Aureobasidium (Pullulara) and Rhizopus  1. Maintain humidity below 50%. 2. Clean washable surfaces with 5% bleach solution. 3. Remove sources e.g. contaminated carpets.     Control of House Dust Mite Allergen    House dust mites play a major role in allergic asthma and rhinitis.  They occur in environments with high humidity wherever human skin, the food for dust mites is found. High levels have been detected in dust obtained from mattresses, pillows, carpets, upholstered furniture, bed covers, clothes and soft toys.  The principal allergen of the house dust mite is found in its feces.  A gram of dust may contain 1,000 mites and 250,000 fecal particles.  Mite antigen is easily measured in the air during house cleaning activities.    1. Encase mattresses, including the box spring, and pillow, in an air tight cover.  Seal the zipper end of the encased mattresses with wide adhesive tape. 2. Wash the bedding in water of 130 degrees Farenheit weekly.  Avoid cotton comforters/quilts and flannel bedding: the most ideal bed covering is the dacron comforter. 3. Remove all upholstered furniture from the bedroom. 4. Remove carpets, carpet padding, rugs, and non-washable window drapes from the bedroom.  Wash drapes weekly or use plastic window coverings. 5. Remove all non-washable stuffed toys from the bedroom.  Wash stuffed toys weekly. 6. Have the room cleaned frequently with a vacuum cleaner and a damp dust-mop.  The patient should not be in a room which is being cleaned and should wait 1 hour after cleaning before going into the room. 7. Close and seal all heating outlets in the bedroom.  Otherwise, the room will become filled with dust-laden air.  An electric heater can be used to heat  the room. 8. Reduce indoor humidity to less than 50%.  Do not use a humidifier.   Allergy Shots   Allergies are the result of a chain reaction that starts in the immune system. Your immune system controls how your body defends itself. For instance, if you have an allergy to pollen, your immune system identifies pollen as an invader or allergen. Your immune system overreacts by producing antibodies called Immunoglobulin E (IgE). These antibodies travel to cells that release chemicals, causing an allergic reaction.  The concept behind allergy immunotherapy, whether it is received in the form of shots or tablets, is that the immune system can be desensitized to specific allergens that trigger allergy symptoms. Although it requires time and patience, the payback can be long-term relief.  How Do Allergy Shots Work?  Allergy shots work much like a vaccine. Your body responds to injected amounts of a particular allergen given in increasing doses, eventually developing a resistance and tolerance to it. Allergy shots can lead to decreased, minimal or no allergy symptoms.  There generally are two phases: build-up and maintenance. Build-up often ranges from three to six months and involves receiving injections with increasing amounts of the allergens. The shots are typically given once or twice a week, though more rapid build-up schedules are sometimes used.  The maintenance phase begins when  the most effective dose is reached. This dose is different for each person, depending on how allergic you are and your response to the build-up injections. Once the maintenance dose is reached, there are longer periods between injections, typically two to four weeks.  Occasionally doctors give cortisone-type shots that can temporarily reduce allergy symptoms. These types of shots are different and should not be confused with allergy immunotherapy shots.  Who Can Be Treated with Allergy Shots?  Allergy shots may be a  good treatment approach for people with allergic rhinitis (hay fever), allergic asthma, conjunctivitis (eye allergy) or stinging insect allergy.   Before deciding to begin allergy shots, you should consider:  . The length of allergy season and the severity of your symptoms . Whether medications and/or changes to your environment can control your symptoms . Your desire to avoid long-term medication use . Time: allergy immunotherapy requires a major time commitment . Cost: may vary depending on your insurance coverage  Allergy shots for children age 37 and older are effective and often well tolerated. They might prevent the onset of new allergen sensitivities or the progression to asthma.  Allergy shots are not started on patients who are pregnant but can be continued on patients who become pregnant while receiving them. In some patients with other medical conditions or who take certain common medications, allergy shots may be of risk. It is important to mention other medications you talk to your allergist.   When Will I Feel Better?  Some may experience decreased allergy symptoms during the build-up phase. For others, it may take as long as 12 months on the maintenance dose. If there is no improvement after a year of maintenance, your allergist will discuss other treatment options with you.  If you aren't responding to allergy shots, it may be because there is not enough dose of the allergen in your vaccine or there are missing allergens that were not identified during your allergy testing. Other reasons could be that there are high levels of the allergen in your environment or major exposure to non-allergic triggers like tobacco smoke.  What Is the Length of Treatment?  Once the maintenance dose is reached, allergy shots are generally continued for three to five years. The decision to stop should be discussed with your allergist at that time. Some people may experience a permanent reduction  of allergy symptoms. Others may relapse and a longer course of allergy shots can be considered.  What Are the Possible Reactions?  The two types of adverse reactions that can occur with allergy shots are local and systemic. Common local reactions include very mild redness and swelling at the injection site, which can happen immediately or several hours after. A systemic reaction, which is less common, affects the entire body or a particular body system. They are usually mild and typically respond quickly to medications. Signs include increased allergy symptoms such as sneezing, a stuffy nose or hives.  Rarely, a serious systemic reaction called anaphylaxis can develop. Symptoms include swelling in the throat, wheezing, a feeling of tightness in the chest, nausea or dizziness. Most serious systemic reactions develop within 30 minutes of allergy shots. This is why it is strongly recommended you wait in your doctor's office for 30 minutes after your injections. Your allergist is trained to watch for reactions, and his or her staff is trained and equipped with the proper medications to identify and treat them.  Who Should Administer Allergy Shots?  The preferred location for receiving shots  is your prescribing allergist's office. Injections can sometimes be given at another facility where the physician and staff are trained to recognize and treat reactions, and have received instructions by your prescribing allergist.

## 2017-05-18 NOTE — Telephone Encounter (Signed)
Patient sent the following message through [place]. Routing to triage to assist patient with request.   ----- Message from [place], Generic sent at 05/18/2017 11:31 AM EDT -----    While discussing weaning me off of my hormones yesterday, I told you I thought I had another box of patches. I do not. I only have one patch left. Could you call me in a box to [facility], please? That way I won't have to get a 3 month supply from [facility]. Thank you!

## 2017-05-18 NOTE — Telephone Encounter (Signed)
I have sent in a one month refill of the transdermal estradiol 0.0375 mg weekly to the patient's [place].  She can follow the directions that Jackelyn Poling gave her for cutting the patches in half.

## 2017-05-18 NOTE — Progress Notes (Signed)
NEW PATIENT  Date of Service/Encounter:  05/18/17  Referring provider: Brunetta Jeans, PA-C   Assessment:    Seasonal and perennial allergic rhinitis (weeds, ragweed, trees, indoor molds, outdoor molds, dust mites)  Ulcerative colitis   Plan/Recommendations:   1. Chronic rhinitis - Testing today showed: trees, weeds, indoor molds, outdoor molds and dust mites - Avoidance measures provided. - Stop taking: Flonase - Continue with: Claritin (loratadine) 51m tablet once daily - Start taking: Xhance (fluticasone) 1-2 sprays per nostril twice daily - You can use an extra dose of the antihistamine, if needed, for breakthrough symptoms.  - Consider nasal saline rinses 1-2 times daily to remove allergens from the nasal cavities as well as help with mucous clearance (this is especially helpful to do before the nasal sprays are given) - Consider allergy shots as a means of long-term control. - Allergy shots "re-train" and "reset" the immune system to ignore environmental allergens and decrease the resulting immune response to those allergens (sneezing, itchy watery eyes, runny nose, nasal congestion, etc).    - Allergy shots improve symptoms in 75-85% of patients.  - We can discuss more at the next appointment if the medications are not working for you. - We did not address her report of wheezing today, as I think that this was more related to nasal congestion rather than wheezing.   2. Ulcerative colitis - Food testing is not indicated for ulcerative colitis. - Our food testing looks at reactions consistent with anaphylaxis (wheezing, vomiting, swelling, throat swelling, etc).  - There is no connection between skin testing for foods and identifying triggering foods for ulcerative colitis. - This would be a waste of money and resources.  - Patient is in agreement with the plan.   3. Return in about 6 months (around 11/17/2017).  Subjective:   Jessica DRUMis a 62y.o. female  presenting today for evaluation of  Chief Complaint  Patient presents with  . Allergic Rhinitis   . Itchy Eye    watery  . sneezing    Jessica NIEHOFFhas a history of the following: Patient Active Problem List   Diagnosis Date Noted  . Seasonal and perennial allergic rhinitis 05/18/2017  . Easy bruising 07/18/2016  . Hypothyroidism 07/15/2016  . Bipolar 2 disorder ([facility]   . Ischemic colitis ([facility]   . Anemia, unspecified 04/18/2012  . Ulcerative colitis ([facility] 02/01/2012    History obtained from: chart review and the patient.  AIona Beardwas referred by MBrunetta Jeans PA-C.     ALurdesis a 62y.o. female presenting for evaluation of possible food allergies, although upon further questioning it seems that she has allergic rhinitis symptoms as well. .    She reports that she has ulcerative colitis and has had it for several years. She did start taking CBD oil which has helped a great deal. She is followed by Dr. JJuanita Craverfor the past five years. She has been on a multitude of medications, including budesonide. She then tried something new and had an allergic reaction to this. This was December 2017. She then quit taking all of her medications at that time since the budesonide did not do anything at all. Then her daughter came up with an idea to come to see an allergist to see if there is anything that makes it worse. It is under control at this point with the CBD oil. She is now going to the bathroom 2-3 times per day  instead of 5-6.   She never has any anaphylactic reaction to any particular foods. She tolerates all of the major food allergens without adverse event. She does not think that her ulcerative colitis flares are related to foods at all; this was an idea of her daughter.   She also reports that she started wheezing at the first of this month. This has never been heard by a medical provider. Then on the 3rd she had full blown chest congestion and felt terrible. The  "wheezing" resolved. This lasted for two weeks. She did not take anything for it. She does not have a history of wheezing or needing a nebulizer treatment. She also sneezing, itchy watery eyes. Symptoms are resolved but she has an intermittent cough.   She has similar symptoms every fall and spring, but overall symptoms have worsened. She is taking AllerClear (lortadine), which does not clear it up completely. She was on cetirizine but this was not doing anything. She has no tried Human resources officer or Xyzal. Symptoms typically include coughing, rhinorrhea, and postnasal drip. She has had itchy watery gritty eyes. She was allergy tested here in our clinic but it was years ago. Symptoms have worsened since that time. Christmas trees and cats are triggers for her symptoms.    Otherwise, there is no history of other atopic diseases, including asthma, drug allergies, stinging insect allergies, or urticaria. There is no significant infectious history. Vaccinations are up to date.    Past Medical History: Patient Active Problem List   Diagnosis Date Noted  . Seasonal and perennial allergic rhinitis 05/18/2017  . Easy bruising 07/18/2016  . Hypothyroidism 07/15/2016  . Bipolar 2 disorder ([place])   . Ischemic colitis ([place])   . Anemia, unspecified 04/18/2012  . Ulcerative colitis ([place]) 02/01/2012    Medication List:  Allergies as of 05/18/2017      Reactions   Penicillins Hives   Sulfa Antibiotics Hives   Xylocaine [lidocaine] Other (See Comments)   Doesn't numb      Medication List        Accurate as of 05/18/17  5:54 PM. Always use your most recent med list.          clonazePAM 0.5 MG tablet Commonly known as:  KLONOPIN Take 1 tablet by mouth daily.   DONNATAL 16.2 MG tablet Generic drug:  belladonna alk-PHENObarbital Take 1 tablet by mouth as needed.   estradiol 0.0375 mg/24hr patch Commonly known as:  CLIMARA - Dosed in mg/24 hr Place 1 patch (0.0375 mg total) onto the skin once a week.     Fluticasone Propionate 93 MCG/ACT Exhu Commonly known as:  XHANCE Place 2 sprays into the nose 2 (two) times daily.   lamoTRIgine 100 MG Tbdp Take 100 mg by mouth daily.   LATUDA 80 MG Tabs tablet Generic drug:  lurasidone Take 1 tablet by mouth daily.   loperamide 2 MG capsule Commonly known as:  IMODIUM Take 2 mg by mouth 4 (four) times daily as needed for diarrhea or loose stools.   loratadine 10 MG tablet Commonly known as:  CLARITIN Take 10 mg by mouth daily.   NEXIUM 40 MG capsule Generic drug:  esomeprazole Take 40 mg by mouth daily at 12 noon.   progesterone 100 MG capsule Commonly known as:  PROMETRIUM Take 1 capsule (100 mg total) by mouth daily.   propranolol 20 MG tablet Commonly known as:  INDERAL Take 1 tablet by mouth 2 (two) times daily.   SYNTHROID 112 MCG tablet  Generic drug:  levothyroxine TAKE 1 TABLET DAILY   vitamin B-12 1000 MCG tablet Commonly known as:  CYANOCOBALAMIN Take 1,000 mcg by mouth daily.   VITAMIN D PO Take by mouth.       Birth History: non-contributory.  Developmental History: non-contributory.   Past Surgical History: Past Surgical History:  Procedure Laterality Date  . CATARACT EXTRACTION W/ INTRAOCULAR LENS IMPLANT Left 03/14/2016  . CATARACT EXTRACTION W/ INTRAOCULAR LENS IMPLANT Right 04/18/2016  . NASAL SEPTUM SURGERY       Family History: Family History  Problem Relation Age of Onset  . Colon cancer Father 36       Colon  . Hypertension Father   . Breast cancer Maternal Grandmother   . Heart failure Mother   . Osteoarthritis Mother   . Depression Mother   . Hypertension Mother   . Stroke Mother   . Asthma Mother   . Cancer Paternal Grandmother        skin cancer, nose  . Heart attack Paternal Grandfather   . Cancer Maternal Grandfather        lung  . Angioedema Neg Hx   . Allergic rhinitis Neg Hx   . Eczema Neg Hx   . Urticaria Neg Hx      Social History: Caryl lives at home with her  husband and daughter. Her daughter is 81 years old. They live in a 61yo home. There is wood flooring throughout the home with carpeting in the bedroom. They have electric heating and central cooling. There is one dog inside of the home and cows outside of the home. There are dust mite coverings on the bed but not the pillows. There is no tobacco exposure in the home. She does not work outside of the home but her husband works as a Electrical engineer at [employer].     Review of Systems: a 14-point review of systems is pertinent for what is mentioned in HPI.  Otherwise, all other systems were negative. Constitutional: negative other than that listed in the HPI Eyes: negative other than that listed in the HPI Ears, nose, mouth, throat, and face: negative other than that listed in the HPI Respiratory: negative other than that listed in the HPI Cardiovascular: negative other than that listed in the HPI Gastrointestinal: negative other than that listed in the HPI Genitourinary: negative other than that listed in the HPI Integument: negative other than that listed in the HPI Hematologic: negative other than that listed in the HPI Musculoskeletal: negative other than that listed in the HPI Neurological: negative other than that listed in the HPI Allergy/Immunologic: negative other than that listed in the HPI    Objective:   Blood pressure 140/84, pulse 80, temperature 98.2 F (36.8 C), temperature source Oral, resp. rate 16, height 5' 6"  (1.676 m), weight 232 lb 6.4 oz (105.4 kg), last menstrual period 01/31/2002. Body mass index is 37.51 kg/m.   Physical Exam:  General: Alert, interactive, in no acute distress. [place]e female. Pleasant.  Eyes: No conjunctival injection bilaterally, no discharge on the right, no discharge on the left and no Horner-Trantas dots present. PERRL bilaterally. EOMI without pain. No photophobia.  Ears: Right TM pearly gray with normal light reflex, Left TM pearly gray with  normal light reflex, Right TM intact without perforation and Left TM intact without perforation.  Nose/Throat: External nose within normal limits and septum midline. Turbinates markedly edematous with thick discharge. Posterior oropharynx erythematous with cobblestoning in the posterior oropharynx. Tonsils 2+ without  exudates.  Tongue without thrush. Neck: Supple without thyromegaly. Trachea midline. Adenopathy: shoddy bilateral anterior cervical lymphadenopathy and no enlarged lymph nodes appreciated in the occipital, axillary, epitrochlear, inguinal, or popliteal regions. Lungs: Clear to auscultation without wheezing, rhonchi or rales. No increased work of breathing. CV: Normal S1/S2. No murmurs. Capillary refill <2 seconds.  Abdomen: Nondistended, nontender. No guarding or rebound tenderness. Bowel sounds present in all fields and hypoactive  Skin: Warm and dry, without lesions or rashes. Extremities:  No clubbing, cyanosis or edema. Neuro:   Grossly intact. No focal deficits appreciated. Responsive to questions.  Diagnostic studies:    Allergy Studies:   Indoor/Outdoor Percutaneous Adult Environmental Panel: negative to the entire panel with adequate controls.  Indoor/Outdoor Selected Intradermal Environmental Panel: positive to ragweed mix, weed mix, tree mix, mold mix #2, mold mix #3, mold mix #4 and mite mix. Otherwise negative with adequate controls.  Allergy testing results were read and interpreted by myself, documented by clinical staff.     Salvatore Marvel, MD [facility]

## 2017-05-18 NOTE — Telephone Encounter (Signed)
See telephone encounter dated 05/18/17.

## 2017-05-18 NOTE — Telephone Encounter (Signed)
Routing to Dr. Quincy Simmonds -please advise on estradiol 0.0375 mg patch once weekly refill ?      From [name] To Regina Eck, CNM Sent 05/18/2017 11:31 AM  While discussing weaning me off of my hormones yesterday, I told you I thought I had another box of patches. I do not. I only have one patch left. Could you call me in a box to [facility], please? That way I won't have to get a 3 month supply from [facility]. Thank you!   Cc: Melvia Heaps, CNM

## 2017-05-18 NOTE — Telephone Encounter (Signed)
Left detailed message on mobile number, ok per dpr. Advised as seen below per Dr. Quincy Simmonds. F/u with [facility] for filling RX, return call to office with any additional questions. Will close encounter.

## 2017-05-29 ENCOUNTER — Other Ambulatory Visit: Payer: Self-pay

## 2017-05-29 ENCOUNTER — Ambulatory Visit: Payer: BLUE CROSS/BLUE SHIELD | Admitting: Physician Assistant

## 2017-05-29 ENCOUNTER — Encounter: Payer: Self-pay | Admitting: Physician Assistant

## 2017-05-29 VITALS — BP 126/80 | HR 64 | Temp 98.5°F | Resp 16 | Ht 67.0 in | Wt 228.0 lb

## 2017-05-29 DIAGNOSIS — R062 Wheezing: Secondary | ICD-10-CM | POA: Diagnosis not present

## 2017-05-29 DIAGNOSIS — J208 Acute bronchitis due to other specified organisms: Secondary | ICD-10-CM | POA: Diagnosis not present

## 2017-05-29 DIAGNOSIS — B9689 Other specified bacterial agents as the cause of diseases classified elsewhere: Secondary | ICD-10-CM | POA: Diagnosis not present

## 2017-05-29 DIAGNOSIS — J302 Other seasonal allergic rhinitis: Secondary | ICD-10-CM

## 2017-05-29 MED ORDER — AZITHROMYCIN 250 MG PO TABS: ORAL_TABLET | ORAL | 0 refills | Status: DC

## 2017-05-29 MED ORDER — ALBUTEROL SULFATE (2.5 MG/3ML) 0.083% IN NEBU
2.5000 mg | INHALATION_SOLUTION | Freq: Once | RESPIRATORY_TRACT | Status: AC
  Administered 2017-05-29: 2.5 mg via RESPIRATORY_TRACT

## 2017-05-29 MED ORDER — ALBUTEROL SULFATE HFA 108 (90 BASE) MCG/ACT IN AERS
2.0000 | INHALATION_SPRAY | Freq: Four times a day (QID) | RESPIRATORY_TRACT | 0 refills | Status: DC | PRN
Start: 2017-05-29 — End: 2017-06-27

## 2017-05-29 MED ORDER — BENZONATATE 100 MG PO CAPS: 100.0000 mg | ORAL_CAPSULE | Freq: Two times a day (BID) | ORAL | 0 refills | Status: DC | PRN

## 2017-05-29 NOTE — Progress Notes (Signed)
Patient presents to clinic today c/o 4 weeks of nasal congestion, sinus pressure/pain (frontal), ear pressure, chest congestion and cough that is productive of clear-colored sputum. Denies recent travel or sick contact. Has noted some wheezing, but none today. Has history of seasonal allergies, followed by an allergist. Is using Xhance and claritin daily as directed.   Past Medical History:  Diagnosis Date  . Anemia, unspecified 04/18/2012  . Anxiety   . Bipolar 2 disorder ([place])   . Depression   . History of chickenpox   . Hypercholesteremia   . Hypothyroidism 02/01/2012  . Ischemic colitis ([place]) 2010  . Menopause   . Migraine   . Ulcerative colitis ([place]) 02/01/2012    Current Outpatient Medications on File Prior to Visit  Medication Sig Dispense Refill  . belladonna alk-PHENObarbital (DONNATAL) 16.2 MG tablet Take 1 tablet by mouth as needed.    . Cholecalciferol (VITAMIN D PO) Take by mouth.    . clonazePAM (KLONOPIN) 0.5 MG tablet Take 1 tablet by mouth daily.    Marland Kitchen estradiol (CLIMARA - DOSED IN MG/24 HR) 0.0375 mg/24hr patch Place 1 patch (0.0375 mg total) onto the skin once a week. 4 patch 0  . Fluticasone Propionate (XHANCE) 93 MCG/ACT EXHU Place 2 sprays into the nose 2 (two) times daily. 32 mL 5  . LamoTRIgine 100 MG TBDP Take 100 mg by mouth daily.     Marland Kitchen LATUDA 80 MG TABS tablet Take 1 tablet by mouth daily.    Marland Kitchen loperamide (IMODIUM) 2 MG capsule Take 2 mg by mouth 4 (four) times daily as needed for diarrhea or loose stools.    Marland Kitchen loratadine (CLARITIN) 10 MG tablet Take 10 mg by mouth daily.    Marland Kitchen NEXIUM 40 MG capsule Take 40 mg by mouth daily at 12 noon.     . progesterone (PROMETRIUM) 100 MG capsule Take 1 capsule (100 mg total) by mouth daily. 90 capsule 4  . propranolol (INDERAL) 20 MG tablet Take 1 tablet by mouth 2 (two) times daily.     Marland Kitchen SYNTHROID 112 MCG tablet TAKE 1 TABLET DAILY 90 tablet 1  . vitamin B-12 (CYANOCOBALAMIN) 1000 MCG tablet Take 1,000 mcg by mouth daily.      No current facility-administered medications on file prior to visit.     Allergies  Allergen Reactions  . Penicillins Hives  . Sulfa Antibiotics Hives  . Xylocaine [Lidocaine] Other (See Comments)    Doesn't numb    Family History  Problem Relation Age of Onset  . Colon cancer Father 16       Colon  . Hypertension Father   . Breast cancer Maternal Grandmother   . Heart failure Mother   . Osteoarthritis Mother   . Depression Mother   . Hypertension Mother   . Stroke Mother   . Asthma Mother   . Cancer Paternal Grandmother        skin cancer, nose  . Heart attack Paternal Grandfather   . Cancer Maternal Grandfather        lung  . Angioedema Neg Hx   . Allergic rhinitis Neg Hx   . Eczema Neg Hx   . Urticaria Neg Hx     Social History   Socioeconomic History  . Marital status: Married    Spouse name: Not on file  . Number of children: 1  . Years of education: BA  . Highest education level: Not on file  Occupational History  . Occupation: N/A  Social  Needs  . Financial resource strain: Not on file  . Food insecurity:    Worry: Not on file    Inability: Not on file  . Transportation needs:    Medical: Not on file    Non-medical: Not on file  Tobacco Use  . Smoking status: Former Smoker    Last attempt to quit: 04/25/1991    Years since quitting: 26.1  . Smokeless tobacco: Never Used  Substance and Sexual Activity  . Alcohol use: Yes    Alcohol/week: 3.6 oz    Types: 6 Standard drinks or equivalent per week    Comment: alcohol about 6 drinks a week  . Drug use: No  . Sexual activity: Yes    Partners: Male    Birth control/protection: Post-menopausal, Other-see comments    Comment: husband vasectomy  Lifestyle  . Physical activity:    Days per week: Not on file    Minutes per session: Not on file  . Stress: Not on file  Relationships  . Social connections:    Talks on phone: Not on file    Gets together: Not on file    Attends religious  service: Not on file    Active member of club or organization: Not on file    Attends meetings of clubs or organizations: Not on file    Relationship status: Not on file  Other Topics Concern  . Not on file  Social History Narrative   2 cups of coffee a day, occasional tea    Review of Systems - See HPI.  All other ROS are negative.  BP 126/80   Pulse 64   Temp 98.5 F (36.9 C) (Oral)   Resp 16   Ht 5' 7"  (1.702 m)   Wt 228 lb (103.4 kg)   LMP 01/31/2002 (Approximate)   SpO2 97%   BMI 35.71 kg/m   Physical Exam  Constitutional: She appears well-developed and well-nourished.  HENT:  Head: Normocephalic and atraumatic.  Right Ear: Tympanic membrane and external ear normal.  Left Ear: Tympanic membrane and external ear normal.  Nose: Mucosal edema present.  Mouth/Throat: Uvula is midline, oropharynx is clear and moist and mucous membranes are normal.  Eyes: Conjunctivae are normal.  Neck: Neck supple.  Cardiovascular: Normal rate, regular rhythm, normal heart sounds and intact distal pulses.  Pulmonary/Chest: Effort normal. No stridor. No respiratory distress. She has wheezes. She has no rales. She exhibits no tenderness.  Lymphadenopathy:    She has no cervical adenopathy.  Skin: Skin is warm.  Vitals reviewed.  Assessment/Plan: 1. Acute bacterial bronchitis Start Azithromycin and Tessalon. Mucinex as directed. Increase fluids. Supportive measures reviewed.  - azithromycin (ZITHROMAX) 250 MG tablet; Take 2 tablets on Day 1. Then take 1 tablet daily.  Dispense: 6 tablet; Refill: 0 - benzonatate (TESSALON) 100 MG capsule; Take 1 capsule (100 mg total) by mouth 2 (two) times daily as needed for cough.  Dispense: 20 capsule; Refill: 0  2. Seasonal allergic rhinitis, unspecified trigger Continue care as directed by allergist.  3. Wheezing Albuterol neb given in office with resolution. Supportive measures reviewed. Rx albuterol MDI for home use.  - albuterol (PROVENTIL  HFA;VENTOLIN HFA) 108 (90 Base) MCG/ACT inhaler; Inhale 2 puffs into the lungs every 6 (six) hours as needed for wheezing or shortness of breath.  Dispense: 1 Inhaler; Refill: 0 - albuterol (PROVENTIL) (2.5 MG/3ML) 0.083% nebulizer solution 2.5 mg   Leeanne Rio, PA-C

## 2017-05-29 NOTE — Patient Instructions (Signed)
Take antibiotic (Azithromycin) as directed.  Increase fluids.  Get plenty of rest. Use Mucinex-DM for congestion. Continue allergy medications. Use the Tessalon as directed for cough. The inhaler is to use as directed if needed for wheeze (see instructions at the bottom of this handout). Take a daily probiotic (I recommend Align or Culturelle, but even Activia Yogurt may be beneficial).  A humidifier placed in the bedroom may offer some relief for a dry, scratchy throat of nasal irritation.  Read information below on acute bronchitis. Please call or return to clinic if symptoms are not improving.  Acute Bronchitis Bronchitis is when the airways that extend from the windpipe into the lungs get red, puffy, and painful (inflamed). Bronchitis often causes thick spit (mucus) to develop. This leads to a cough. A cough is the most common symptom of bronchitis. In acute bronchitis, the condition usually begins suddenly and goes away over time (usually in 2 weeks). Smoking, allergies, and asthma can make bronchitis worse. Repeated episodes of bronchitis may cause more lung problems.  HOME CARE  Rest.  Drink enough fluids to keep your pee (urine) clear or pale yellow (unless you need to limit fluids as told by your doctor).  Only take over-the-counter or prescription medicines as told by your doctor.  Avoid smoking and secondhand smoke. These can make bronchitis worse. If you are a smoker, think about using nicotine gum or skin patches. Quitting smoking will help your lungs heal faster.  Reduce the chance of getting bronchitis again by:  Washing your hands often.  Avoiding people with cold symptoms.  Trying not to touch your hands to your mouth, nose, or eyes.  Follow up with your doctor as told.  GET HELP IF: Your symptoms do not improve after 1 week of treatment. Symptoms include:  Cough.  Fever.  Coughing up thick spit.  Body aches.  Chest congestion.  Chills.  Shortness of  breath.  Sore throat.  GET HELP RIGHT AWAY IF:   You have an increased fever.  You have chills.  You have severe shortness of breath.  You have bloody thick spit (sputum).  You throw up (vomit) often.  You lose too much body fluid (dehydration).  You have a severe headache.  You faint.  MAKE SURE YOU:   Understand these instructions.  Will watch your condition.  Will get help right away if you are not doing well or get worse. Document Released: 07/06/2007 Document Revised: 09/19/2012 Document Reviewed: 07/10/2012 [facility] Patient Information 2015 [place], [place]. This information is not intended to replace advice given to you by your health care provider. Make sure you discuss any questions you have with your health care provider.   How to Use a Metered Dose Inhaler A metered dose inhaler is a handheld device for taking medicine that must be breathed into the lungs (inhaled). The device can be used to deliver a variety of inhaled medicines, including:  Quick relief or rescue medicines, such as bronchodilators.  Controller medicines, such as corticosteroids.  The medicine is delivered by pushing down on a metal canister to release a preset amount of spray and medicine. Each device contains the amount of medicine that is needed for a preset number of uses (inhalations). Your health care provider may recommend that you use a spacer with your inhaler to help you take the medicine more effectively. A spacer is a plastic tube with a mouthpiece on one end and an opening that connects to the inhaler on the other end. A  spacer holds the medicine in a tube for a short time, which allows you to inhale more medicine. What are the risks? If you do not use your inhaler correctly, medicine might not reach your lungs to help you breathe. Inhaler medicine can cause side effects, such as:  Mouth or throat infection.  Cough.  Hoarseness.  Headache.  Nausea and vomiting.  Lung  infection (pneumonia) in people who have a lung condition called COPD.  How to use a metered dose inhaler without a spacer 1. Remove the cap from the inhaler. 2. If you are using the inhaler for the first time, shake it for 5 seconds, turn it away from your face, then release 4 puffs into the air. This is called priming. 3. Shake the inhaler for 5 seconds. 4. Position the inhaler so the top of the canister faces up. 5. Put your index finger on the top of the medicine canister. Support the bottom of the inhaler with your thumb. 6. Breathe out normally and as completely as possible, away from the inhaler. 7. Either place the inhaler between your teeth and close your lips tightly around the mouthpiece, or hold the inhaler 1-2 inches (2.5-5 cm) away from your open mouth. Keep your tongue down out of the way. If you are unsure which technique to use, ask your health care provider. 8. Press the canister down with your index finger to release the medicine, then inhale deeply and slowly through your mouth (not your nose) until your lungs are completely filled. Inhaling should take 4-6 seconds. 9. Hold the medicine in your lungs for 5-10 seconds (10 seconds is best). This helps the medicine get into the small airways of your lungs. 10. With your lips in a tight circle (pursed), breathe out slowly. 11. Repeat steps 3-10 until you have taken the number of puffs that your health care provider directed. Wait about 1 minute between puffs or as directed. 12. Put the cap on the inhaler. 13. If you are using a steroid inhaler, rinse your mouth with water, gargle, and spit out the water. Do not swallow the water. How to use a metered dose inhaler with a spacer 1. Remove the cap from the inhaler. 2. If you are using the inhaler for the first time, shake it for 5 seconds, turn it away from your face, then release 4 puffs into the air. This is called priming. 3. Shake the inhaler for 5 seconds. 4. Place the open end  of the spacer onto the inhaler mouthpiece. 5. Position the inhaler so the top of the canister faces up and the spacer mouthpiece faces you. 6. Put your index finger on the top of the medicine canister. Support the bottom of the inhaler and the spacer with your thumb. 7. Breathe out normally and as completely as possible, away from the spacer. 8. Place the spacer between your teeth and close your lips tightly around it. Keep your tongue down out of the way. 9. Press the canister down with your index finger to release the medicine, then inhale deeply and slowly through your mouth (not your nose) until your lungs are completely filled. Inhaling should take 4-6 seconds. 10. Hold the medicine in your lungs for 5-10 seconds (10 seconds is best). This helps the medicine get into the small airways of your lungs. 11. With your lips in a tight circle (pursed), breathe out slowly. 12. Repeat steps 3-11 until you have taken the number of puffs that your health care provider directed.  Wait about 1 minute between puffs or as directed. 13. Remove the spacer from the inhaler and put the cap on the inhaler. 14. If you are using a steroid inhaler, rinse your mouth with water, gargle, and spit out the water. Do not swallow the water. Follow these instructions at home:  Take your inhaled medicine only as told by your health care provider. Do not use the inhaler more than directed by your health care provider.  Keep all follow-up visits as told by your health care provider. This is important.  If your inhaler has a counter, you can check it to determine how full your inhaler is. If your inhaler does not have a counter, ask your health care provider when you will need to refill your inhaler and write the refill date on a calendar or on your inhaler canister. Note that you cannot know when an inhaler is empty by shaking it.  Follow directions on the package insert for care and cleaning of your inhaler and  spacer. Contact a health care provider if:  Symptoms are only partially relieved with your inhaler.  You are having trouble using your inhaler.  You have an increase in phlegm.  You have headaches. Get help right away if:  You feel little or no relief after using your inhaler.  You have dizziness.  You have a fast heart rate.  You have chills or a fever.  You have night sweats.  There is blood in your phlegm. Summary  A metered dose inhaler is a handheld device for taking medicine that must be breathed into the lungs (inhaled).  The medicine is delivered by pushing down on a metal canister to release a preset amount of spray and medicine.  Each device contains the amount of medicine that is needed for a preset number of uses (inhalations). This information is not intended to replace advice given to you by your health care provider. Make sure you discuss any questions you have with your health care provider. Document Released: 01/17/2005 Document Revised: 12/08/2015 Document Reviewed: 12/08/2015 Elsevier Interactive Patient Education  2017 [employer].

## 2017-06-05 ENCOUNTER — Encounter: Payer: Self-pay | Admitting: Allergy & Immunology

## 2017-06-21 ENCOUNTER — Encounter: Payer: Self-pay | Admitting: Certified Nurse Midwife

## 2017-06-22 DIAGNOSIS — G4733 Obstructive sleep apnea (adult) (pediatric): Secondary | ICD-10-CM | POA: Diagnosis not present

## 2017-06-27 ENCOUNTER — Other Ambulatory Visit: Payer: Self-pay | Admitting: Physician Assistant

## 2017-06-27 DIAGNOSIS — R062 Wheezing: Secondary | ICD-10-CM

## 2017-07-11 ENCOUNTER — Encounter: Payer: Self-pay | Admitting: Adult Health

## 2017-07-13 ENCOUNTER — Ambulatory Visit: Payer: BLUE CROSS/BLUE SHIELD | Admitting: Adult Health

## 2017-07-13 ENCOUNTER — Encounter: Payer: Self-pay | Admitting: Adult Health

## 2017-07-13 VITALS — BP 145/82 | HR 64 | Ht 67.0 in | Wt 231.6 lb

## 2017-07-13 DIAGNOSIS — G4733 Obstructive sleep apnea (adult) (pediatric): Secondary | ICD-10-CM

## 2017-07-13 DIAGNOSIS — Z9989 Dependence on other enabling machines and devices: Secondary | ICD-10-CM | POA: Diagnosis not present

## 2017-07-13 NOTE — Patient Instructions (Signed)
Your Plan:  Continue using CPAP nightly and >4 hours each night If your symptoms worsen or you develop new symptoms please let us know.   Thank you for coming to see us at [facility]. I hope we have been able to provide you high quality care today.  You may receive a patient satisfaction survey over the next few weeks. We would appreciate your feedback and comments so that we may continue to improve ourselves and the health of our patients.  

## 2017-07-13 NOTE — Progress Notes (Addendum)
PATIENT: Jessica Berger DOB: 09-10-1955  REASON FOR VISIT: follow up HISTORY FROM: patient  HISTORY OF PRESENT ILLNESS: Today 07/13/17:  Jessica Berger is a 62 year old female with a history of obstructive sleep apnea on CPAP.  Her CPAP download indicates that she use her machine 30 out of 30 days for compliance of 100%.  She use her machine greater than 4 hours each night.  On average she uses her machine 10 hours and 8 minutes.  Her residual AHI is 0.7 on 8 cm of water with EPR 2.  He does not have a significant leak.  She reports that she "loves her CPAP."  Her Epworth sleepiness score is 5.  She returns today for evaluation.  HISTORY 07/13/2016 (Copied from Dr. Guadelupe Sabin notes):  I reviewed her CPAP compliance data from 06/13/2016 through 07/12/2016, which is a total of 30 days, during which time she used her CPAP 29 days with percent used days greater than 4 hours at 97%, indicating excellent compliance with an average usage of 10 hours and 11 minutes which is high. Average AHI was 1.1 per hour, leaked low with the 95th percentile at 2.6 L/m on a pressure of 8 cm with EPR of 2. She reports doing okay, seen Pauline Good, NP, since Dr. Caprice Beaver left. She has had worsening bruising, mostly arms and hands. Has seen dermatologist for this before. She bruised both shins some 2 weeks ago, feel forward, while climbing onto the stairs of a bus.  She is up to date with her supplies. Her mother passed away on May 30, 2016. She is the executor. Has a sister and a brother, one brother passed away. She is the oldest sibling.   The patient's allergies, current medications, family history, past medical history, past social history, past surgical history and problem list were reviewed and updated as appropriate   REVIEW OF SYSTEMS: Out of a complete 14 system review of symptoms, the patient complains only of the following symptoms, and all other reviewed systems are negative.  Depression, apnea,  moles  ALLERGIES: Allergies  Allergen Reactions  . Penicillins Hives  . Sulfa Antibiotics Hives  . Xylocaine [Lidocaine] Other (See Comments)    Doesn't numb    HOME MEDICATIONS: Outpatient Medications Prior to Visit  Medication Sig Dispense Refill  . belladonna alk-PHENObarbital (DONNATAL) 16.2 MG tablet Take 1 tablet by mouth as needed.    . Cholecalciferol (VITAMIN D PO) Take by mouth.    . clonazePAM (KLONOPIN) 0.5 MG tablet Take 1 tablet by mouth daily.    . LamoTRIgine 100 MG TBDP Take 100 mg by mouth daily.     Marland Kitchen LATUDA 80 MG TABS tablet Take 1 tablet by mouth daily.    Marland Kitchen loperamide (IMODIUM) 2 MG capsule Take 2 mg by mouth 4 (four) times daily as needed for diarrhea or loose stools.    Marland Kitchen loratadine (CLARITIN) 10 MG tablet Take 10 mg by mouth daily.    Marland Kitchen NEXIUM 40 MG capsule Take 40 mg by mouth daily at 12 noon.     . propranolol (INDERAL) 20 MG tablet Take 1 tablet by mouth 2 (two) times daily.     Marland Kitchen SYNTHROID 112 MCG tablet TAKE 1 TABLET DAILY 90 tablet 1  . vitamin B-12 (CYANOCOBALAMIN) 1000 MCG tablet Take 1,000 mcg by mouth daily.    . Fluticasone Propionate (XHANCE) 93 MCG/ACT EXHU Place 2 sprays into the nose 2 (two) times daily. 32 mL 5  . albuterol (PROVENTIL HFA;VENTOLIN HFA) 108 (  90 Base) MCG/ACT inhaler TAKE 2 PUFFS BY MOUTH EVERY 6 HOURS AS NEEDED FOR WHEEZE OR SHORTNESS OF BREATH 8.5 Inhaler 3  . azithromycin (ZITHROMAX) 250 MG tablet Take 2 tablets on Day 1. Then take 1 tablet daily. 6 tablet 0  . benzonatate (TESSALON) 100 MG capsule Take 1 capsule (100 mg total) by mouth 2 (two) times daily as needed for cough. 20 capsule 0  . estradiol (CLIMARA - DOSED IN MG/24 HR) 0.0375 mg/24hr patch Place 1 patch (0.0375 mg total) onto the skin once a week. 4 patch 0  . progesterone (PROMETRIUM) 100 MG capsule Take 1 capsule (100 mg total) by mouth daily. 90 capsule 4   No facility-administered medications prior to visit.     PAST MEDICAL HISTORY: Past Medical History:   Diagnosis Date  . Anemia, unspecified 04/18/2012  . Anxiety   . Bipolar 2 disorder ([place])   . Depression   . History of chickenpox   . Hypercholesteremia   . Hypothyroidism 02/01/2012  . Ischemic colitis ([place]) 2010  . Menopause   . Migraine   . OSA on CPAP   . Ulcerative colitis ([place]) 02/01/2012    PAST SURGICAL HISTORY: Past Surgical History:  Procedure Laterality Date  . CATARACT EXTRACTION W/ INTRAOCULAR LENS IMPLANT Left 03/14/2016  . CATARACT EXTRACTION W/ INTRAOCULAR LENS IMPLANT Right 04/18/2016  . NASAL SEPTUM SURGERY      FAMILY HISTORY: Family History  Problem Relation Age of Onset  . Colon cancer Father 19       Colon  . Hypertension Father   . Breast cancer Maternal Grandmother   . Heart failure Mother   . Osteoarthritis Mother   . Depression Mother   . Hypertension Mother   . Stroke Mother   . Asthma Mother   . Cancer Paternal Grandmother        skin cancer, nose  . Heart attack Paternal Grandfather   . Cancer Maternal Grandfather        lung  . Angioedema Neg Hx   . Allergic rhinitis Neg Hx   . Eczema Neg Hx   . Urticaria Neg Hx     SOCIAL HISTORY: Social History   Socioeconomic History  . Marital status: Married    Spouse name: Not on file  . Number of children: 1  . Years of education: BA  . Highest education level: Not on file  Occupational History  . Occupation: N/A  Social Needs  . Financial resource strain: Not on file  . Food insecurity:    Worry: Not on file    Inability: Not on file  . Transportation needs:    Medical: Not on file    Non-medical: Not on file  Tobacco Use  . Smoking status: Former Smoker    Last attempt to quit: 04/25/1991    Years since quitting: 26.2  . Smokeless tobacco: Never Used  Substance and Sexual Activity  . Alcohol use: Yes    Alcohol/week: 3.6 oz    Types: 6 Standard drinks or equivalent per week    Comment: alcohol about 6 drinks a week  . Drug use: No  . Sexual activity: Yes    Partners: Male     Birth control/protection: Post-menopausal, Other-see comments    Comment: husband vasectomy  Lifestyle  . Physical activity:    Days per week: Not on file    Minutes per session: Not on file  . Stress: Not on file  Relationships  . Social connections:  Talks on phone: Not on file    Gets together: Not on file    Attends religious service: Not on file    Active member of club or organization: Not on file    Attends meetings of clubs or organizations: Not on file    Relationship status: Not on file  . Intimate partner violence:    Fear of current or ex partner: Not on file    Emotionally abused: Not on file    Physically abused: Not on file    Forced sexual activity: Not on file  Other Topics Concern  . Not on file  Social History Narrative   2 cups of coffee a day, occasional tea       PHYSICAL EXAM  Vitals:   07/13/17 1417  BP: (!) 145/82  Pulse: 64  Weight: 231 lb 9.6 oz (105.1 kg)  Height: _0  (1.702 m)   Body mass index is 36.27 kg/m.  Generalized: Well developed, in no acute distress   Neurological examination  Mentation: Alert oriented to time, place, history taking. Follows all commands speech and language fluent Cranial nerve II-XII: Pupils were equal round reactive to light. Extraocular movements were full, visual field were full on confrontational test. Facial sensation and strength were normal. Uvula tongue midline. Head turning and shoulder shrug  were normal and symmetric. Motor: The motor testing reveals 5 over 5 strength of all 4 extremities. Good symmetric motor tone is noted throughout.  Sensory: Sensory testing is intact to soft touch on all 4 extremities. No evidence of extinction is noted.  Gait and station: Gait is normal.  Reflexes: Deep tendon reflexes are symmetric and normal bilaterally.   DIAGNOSTIC DATA (LABS, IMAGING, TESTING) - I reviewed patient records, labs, notes, testing and imaging myself where available.  Lab Results   Component Value Date   WBC 6.8 02/14/2017   HGB 14.9 02/14/2017   HCT 45.6 02/14/2017   MCV 97.4 02/14/2017   PLT 323.0 02/14/2017      Component Value Date/Time   NA 137 02/14/2017 1419   NA 139 05/12/2014   NA 138 04/18/2012 1346   K 4.6 02/14/2017 1419   K 4.0 04/18/2012 1346   CL 101 02/14/2017 1419   CL 104 04/18/2012 1346   CO2 28 02/14/2017 1419   CO2 25 04/18/2012 1346   GLUCOSE 105 (H) 02/14/2017 1419   GLUCOSE 93 04/18/2012 1346   BUN 11 02/14/2017 1419   BUN 10 05/12/2014   BUN 17.2 04/18/2012 1346   CREATININE 0.86 02/14/2017 1419   CREATININE 1.0 04/18/2012 1346   CALCIUM 9.4 02/14/2017 1419   CALCIUM 9.1 04/18/2012 1346   PROT 6.7 02/14/2017 1419   PROT 6.8 04/18/2012 1346   ALBUMIN 4.1 02/14/2017 1419   ALBUMIN 3.6 04/18/2012 1346   AST 29 02/14/2017 1419   AST 15 04/18/2012 1346   ALT 31 02/14/2017 1419   ALT 12 04/18/2012 1346   ALKPHOS 78 02/14/2017 1419   ALKPHOS 78 04/18/2012 1346   BILITOT 0.4 02/14/2017 1419   BILITOT 0.38 04/18/2012 1346   Lab Results  Component Value Date   CHOL 224 (H) 02/14/2017   HDL 56.60 02/14/2017   LDLCALC 131 (H) 02/14/2017   TRIG 182.0 (H) 02/14/2017   CHOLHDL 4 02/14/2017   Lab Results  Component Value Date   HGBA1C 5.5 02/15/2017   Lab Results  Component Value Date   WRUEAVWU98 119 08/12/2014   Lab Results  Component Value Date   TSH  3.13 02/14/2017      ASSESSMENT AND PLAN 62 y.o. year old female  has a past medical history of Anemia, unspecified (04/18/2012), Anxiety, Bipolar 2 disorder ([place]), Depression, History of chickenpox, Hypercholesteremia, Hypothyroidism (02/01/2012), Ischemic colitis ([place]) (2010), Menopause, Migraine, OSA on CPAP, and Ulcerative colitis ([place]) (02/01/2012). here with:  1.  Obstructive sleep apnea on CPAP  The patient CPAP download shows excellent compliance and good treatment of her apnea.  She is encouraged to continue using the CPAP nightly.  She is advised that if her  symptoms worsen or she develops new symptoms she should let us know.  She will follow-up in 6 months or sooner if needed.   I spent 15 minutes with the patient. 50% of this time was spent reviewing her CPAP download   Ward Givens, MSN, NP-C 07/13/2017, 2:23 PM [facility] [address] [place], [place] [address] [phone]  I reviewed the above note and documentation by the Nurse Practitioner and agree with the history, physical exam, assessment and plan as outlined above. I was immediately available for face-to-face consultation. Star Age, MD, PhD [facility])

## 2017-08-04 DIAGNOSIS — D225 Melanocytic nevi of trunk: Secondary | ICD-10-CM | POA: Diagnosis not present

## 2017-08-04 DIAGNOSIS — D1801 Hemangioma of skin and subcutaneous tissue: Secondary | ICD-10-CM | POA: Diagnosis not present

## 2017-08-04 DIAGNOSIS — D692 Other nonthrombocytopenic purpura: Secondary | ICD-10-CM | POA: Diagnosis not present

## 2017-08-04 DIAGNOSIS — D485 Neoplasm of uncertain behavior of skin: Secondary | ICD-10-CM | POA: Diagnosis not present

## 2017-08-04 DIAGNOSIS — L821 Other seborrheic keratosis: Secondary | ICD-10-CM | POA: Diagnosis not present

## 2017-08-08 DIAGNOSIS — F411 Generalized anxiety disorder: Secondary | ICD-10-CM | POA: Diagnosis not present

## 2017-08-08 DIAGNOSIS — F3132 Bipolar disorder, current episode depressed, moderate: Secondary | ICD-10-CM | POA: Diagnosis not present

## 2017-08-21 ENCOUNTER — Encounter: Payer: Self-pay | Admitting: Emergency Medicine

## 2017-08-21 DIAGNOSIS — D123 Benign neoplasm of transverse colon: Secondary | ICD-10-CM | POA: Diagnosis not present

## 2017-08-21 DIAGNOSIS — D125 Benign neoplasm of sigmoid colon: Secondary | ICD-10-CM | POA: Diagnosis not present

## 2017-08-21 DIAGNOSIS — Z1211 Encounter for screening for malignant neoplasm of colon: Secondary | ICD-10-CM | POA: Diagnosis not present

## 2017-08-21 DIAGNOSIS — D122 Benign neoplasm of ascending colon: Secondary | ICD-10-CM | POA: Diagnosis not present

## 2017-08-21 DIAGNOSIS — K635 Polyp of colon: Secondary | ICD-10-CM | POA: Diagnosis not present

## 2017-08-21 LAB — HM COLONOSCOPY

## 2017-09-22 ENCOUNTER — Other Ambulatory Visit: Payer: Self-pay

## 2017-09-22 ENCOUNTER — Encounter: Payer: Self-pay | Admitting: Physician Assistant

## 2017-09-22 ENCOUNTER — Ambulatory Visit: Payer: BLUE CROSS/BLUE SHIELD | Admitting: Physician Assistant

## 2017-09-22 VITALS — BP 110/78 | HR 67 | Temp 98.0°F | Resp 14 | Ht 67.0 in | Wt 228.0 lb

## 2017-09-22 DIAGNOSIS — E039 Hypothyroidism, unspecified: Secondary | ICD-10-CM | POA: Diagnosis not present

## 2017-09-22 DIAGNOSIS — Z23 Encounter for immunization: Secondary | ICD-10-CM

## 2017-09-22 DIAGNOSIS — E559 Vitamin D deficiency, unspecified: Secondary | ICD-10-CM | POA: Diagnosis not present

## 2017-09-22 LAB — COMPREHENSIVE METABOLIC PANEL
ALK PHOS: 66 U/L (ref 39–117)
ALT: 31 U/L (ref 0–35)
AST: 28 U/L (ref 0–37)
Albumin: 4.2 g/dL (ref 3.5–5.2)
BILIRUBIN TOTAL: 0.6 mg/dL (ref 0.2–1.2)
BUN: 11 mg/dL (ref 6–23)
CALCIUM: 9.6 mg/dL (ref 8.4–10.5)
CO2: 28 mEq/L (ref 19–32)
Chloride: 99 mEq/L (ref 96–112)
Creatinine, Ser: 0.92 mg/dL (ref 0.40–1.20)
GFR: 65.9 mL/min (ref >60.00)
Glucose, Bld: 115 mg/dL — ABNORMAL HIGH (ref 70–99)
POTASSIUM: 4.2 meq/L (ref 3.5–5.1)
Sodium: 134 mEq/L — ABNORMAL LOW (ref 135–145)
TOTAL PROTEIN: 6.5 g/dL (ref 6.0–8.3)

## 2017-09-22 LAB — TSH: TSH: 1.04 u[IU]/mL (ref 0.35–4.50)

## 2017-09-22 NOTE — Progress Notes (Signed)
Patient presents to clinic today requesting her calcium level be checked today as she has noted a craving for mustard over the past 1-2 weeks. States this is unusual for her. Endorses well-balanced diet overall. No history of low calcium. Remote history of lower Vitamin D. Is currently on Synthroid 112 mcg daily, taking as directed. Is due for repeat TSH levels.  Past Medical History:  Diagnosis Date  . Anemia, unspecified 04/18/2012  . Anxiety   . Bipolar 2 disorder ([place])   . Depression   . History of chickenpox   . Hypercholesteremia   . Hypothyroidism 02/01/2012  . Ischemic colitis ([place]) 2010  . Menopause   . Migraine   . OSA on CPAP   . Ulcerative colitis ([place]) 02/01/2012    Current Outpatient Medications on File Prior to Visit  Medication Sig Dispense Refill  . belladonna alk-PHENObarbital (DONNATAL) 16.2 MG tablet Take 1 tablet by mouth as needed.    . Cholecalciferol (VITAMIN D PO) Take by mouth.    . clonazePAM (KLONOPIN) 0.5 MG tablet Take 1 tablet by mouth daily.    . LamoTRIgine 100 MG TBDP Take 100 mg by mouth daily.     Marland Kitchen LATUDA 80 MG TABS tablet Take 1 tablet by mouth daily.    Marland Kitchen loperamide (IMODIUM) 2 MG capsule Take 2 mg by mouth 4 (four) times daily as needed for diarrhea or loose stools.    Marland Kitchen loratadine (CLARITIN) 10 MG tablet Take 10 mg by mouth daily.    Marland Kitchen NEXIUM 40 MG capsule Take 40 mg by mouth daily at 12 noon.     . propranolol (INDERAL) 20 MG tablet Take 1 tablet by mouth 2 (two) times daily.     Marland Kitchen SYNTHROID 112 MCG tablet TAKE 1 TABLET DAILY 90 tablet 1  . vitamin B-12 (CYANOCOBALAMIN) 1000 MCG tablet Take 1,000 mcg by mouth daily.     No current facility-administered medications on file prior to visit.     Allergies  Allergen Reactions  . Penicillins Hives  . Sulfa Antibiotics Hives  . Xylocaine [Lidocaine] Other (See Comments)    Doesn't numb    Family History  Problem Relation Age of Onset  . Colon cancer Father 58       Colon  . Hypertension  Father   . Breast cancer Maternal Grandmother   . Heart failure Mother   . Osteoarthritis Mother   . Depression Mother   . Hypertension Mother   . Stroke Mother   . Asthma Mother   . Cancer Paternal Grandmother        skin cancer, nose  . Heart attack Paternal Grandfather   . Cancer Maternal Grandfather        lung  . Angioedema Neg Hx   . Allergic rhinitis Neg Hx   . Eczema Neg Hx   . Urticaria Neg Hx     Social History   Socioeconomic History  . Marital status: Married    Spouse name: Not on file  . Number of children: 1  . Years of education: BA  . Highest education level: Not on file  Occupational History  . Occupation: N/A  Social Needs  . Financial resource strain: Not on file  . Food insecurity:    Worry: Not on file    Inability: Not on file  . Transportation needs:    Medical: Not on file    Non-medical: Not on file  Tobacco Use  . Smoking status: Former Smoker  Last attempt to quit: 04/25/1991    Years since quitting: 26.4  . Smokeless tobacco: Never Used  Substance and Sexual Activity  . Alcohol use: Yes    Alcohol/week: 6.0 standard drinks    Types: 6 Standard drinks or equivalent per week    Comment: alcohol about 6 drinks a week  . Drug use: No  . Sexual activity: Yes    Partners: Male    Birth control/protection: Post-menopausal, Other-see comments    Comment: husband vasectomy  Lifestyle  . Physical activity:    Days per week: Not on file    Minutes per session: Not on file  . Stress: Not on file  Relationships  . Social connections:    Talks on phone: Not on file    Gets together: Not on file    Attends religious service: Not on file    Active member of club or organization: Not on file    Attends meetings of clubs or organizations: Not on file    Relationship status: Not on file  Other Topics Concern  . Not on file  Social History Narrative   2 cups of coffee a day, occasional tea    Review of Systems - See HPI.  All other ROS  are negative.  BP 110/78   Pulse 67   Temp 98 F (36.7 C) (Oral)   Resp 14   Ht _0  (1.702 m)   Wt 228 lb (103.4 kg)   LMP 01/31/2002 (Approximate)   SpO2 99%   BMI 35.71 kg/m   Physical Exam  Constitutional: She is oriented to person, place, and time. She appears well-developed and well-nourished.  HENT:  Head: Normocephalic and atraumatic.  Right Ear: External ear normal.  Left Ear: External ear normal.  Nose: Nose normal.  Mouth/Throat: Oropharynx is clear and moist.  Eyes: Conjunctivae are normal.  Neck: Neck supple.  Cardiovascular: Normal rate, regular rhythm, normal heart sounds and intact distal pulses.  Pulmonary/Chest: Effort normal and breath sounds normal. No stridor. No respiratory distress. She has no wheezes. She has no rales. She exhibits no tenderness.  Neurological: She is alert and oriented to person, place, and time. She displays normal reflexes. No cranial nerve deficit or sensory deficit. She exhibits normal muscle tone. Coordination normal.  Psychiatric: She has a normal mood and affect.  Vitals reviewed.  Recent Results (from the past 2160 hour(s))  HM COLONOSCOPY     Status: None   Collection Time: 08/21/17 12:00 AM  Result Value Ref Range   HM Colonoscopy See Report (in chart) See Report (in chart), Patient Reported   Assessment/Plan: 1. Vitamin D deficiency Will check Vit D and Calcium level today.  - Vitamin D 1,25 dihydroxy - Comp Met (CMET) - TSH  2. Hypothyroidism, unspecified type Repeat TSH levels today to assess further.  - Vitamin D 1,25 dihydroxy - Comp Met (CMET) - TSH  3. Need for immunization against influenza Flu vaccine given.  - Flu Vaccine QUAD 36+ mos IM   Leeanne Rio, PA-C

## 2017-09-22 NOTE — Patient Instructions (Signed)
Please go to the lab today for blood work.  I will call you with your results. We will alter treatment regimen(s) if indicated by your results.   Keep a well-balanced diet. Continue your medications as directed.

## 2017-09-25 DIAGNOSIS — G4733 Obstructive sleep apnea (adult) (pediatric): Secondary | ICD-10-CM | POA: Diagnosis not present

## 2017-09-26 LAB — VITAMIN D 1,25 DIHYDROXY
VITAMIN D 1, 25 (OH) TOTAL: 47 pg/mL (ref 18–72)
VITAMIN D3 1, 25 (OH): 28 pg/mL
Vitamin D2 1, 25 (OH)2: 19 pg/mL

## 2017-09-27 DIAGNOSIS — H1045 Other chronic allergic conjunctivitis: Secondary | ICD-10-CM | POA: Diagnosis not present

## 2017-10-18 ENCOUNTER — Telehealth: Payer: Self-pay | Admitting: Certified Nurse Midwife

## 2017-10-18 NOTE — Telephone Encounter (Signed)
Call to patient. Patient here for aex on 05-17-17, states she started weaning off HRT after appointment and it took her about 3 weeks to completely be off all hormones. Patient states since she has stopped taking the HRT, her hot flashes have increased. Denies other vasomotor symptoms. Patient states the hot flashes are keeping her up at night. Asking if she should go back on HRT or if there is something natural Jessica Berger recommends she try. RN advised would review with Jessica Berger, CNM and return call. Patient agreeable.   Routing to provider for review.

## 2017-10-18 NOTE — Telephone Encounter (Signed)
She could try Estroven for night usage it OTC and maybe enough to help her through this right now. She needs to call if no change after using for one week.

## 2017-10-18 NOTE — Telephone Encounter (Signed)
Called and left a message for patient to call back to schedule a new patient doctor referral appointment with our office.  Burning up here! Constant hot flashes. Really a problem when I'm exercising and already hot and sweaty and then I feel like I'm wrapped in plastic wrap!    I'd rather not go back on hormones. Are there any alternatives? The natural remedies (supplements) don't look reliable and have side effects.     Thanks for any help you can give me!    Jessica Berger

## 2017-10-19 NOTE — Telephone Encounter (Signed)
Message left to return call to Emily at [phone].    

## 2017-10-20 NOTE — Telephone Encounter (Signed)
Message left to return call to Breckin Savannah at [phone].    

## 2017-10-20 NOTE — Telephone Encounter (Signed)
Patient returned call. Message given to patient as seen below from Melvia Heaps, CNM and patient verbalized understanding. Patient aware to return call if symptoms do not resolve after one week.   Routing to provider for final review. Patient agreeable to disposition. Will close encounter.

## 2017-11-04 ENCOUNTER — Other Ambulatory Visit: Payer: Self-pay | Admitting: Physician Assistant

## 2017-11-16 ENCOUNTER — Ambulatory Visit: Payer: BLUE CROSS/BLUE SHIELD | Admitting: Allergy & Immunology

## 2017-11-21 ENCOUNTER — Encounter: Payer: Self-pay | Admitting: Allergy & Immunology

## 2017-11-21 ENCOUNTER — Ambulatory Visit: Payer: BLUE CROSS/BLUE SHIELD | Admitting: Allergy & Immunology

## 2017-11-21 VITALS — BP 132/84 | HR 70 | Resp 16

## 2017-11-21 DIAGNOSIS — J3089 Other allergic rhinitis: Secondary | ICD-10-CM

## 2017-11-21 DIAGNOSIS — J302 Other seasonal allergic rhinitis: Secondary | ICD-10-CM | POA: Diagnosis not present

## 2017-11-21 NOTE — Progress Notes (Signed)
FOLLOW UP  Date of Service/Encounter:  11/21/17   Assessment:   Seasonal and perennial allergic rhinitis (trees, weeds, indoor molds, outdoor molds and dust mites)  Plan/Recommendations:   1. Chronic rhinitis - Testing today showed: trees, weeds, indoor molds, outdoor molds and dust mites - Avoidance measures provided. - Continue with: Claritin (loratadine) 66m tablet once daily and Flonase (fluticasone) two sprays per nostril daily (up to twice daily if needed) - Samples of Allegra provided to see if this works better than the Claritin.  - It seems that we can hold off on shots.   2. Return in about 1 year (around 11/22/2018).    Subjective:   Jessica LIPTAKis a 62y.o. female presenting today for follow up of  Chief Complaint  Patient presents with  . Nasal Congestion    Jessica KASPARIANhas a history of the following: Patient Active Problem List   Diagnosis Date Noted  . Seasonal and perennial allergic rhinitis 05/18/2017  . Easy bruising 07/18/2016  . Hypothyroidism 07/15/2016  . Bipolar 2 disorder ([place]   . Ischemic colitis ([place]   . Anemia, unspecified 04/18/2012  . Ulcerative colitis ([place] 02/01/2012    History obtained from: chart review and patient.  Jessica Berger's Primary Care Provider is MBrunetta Jeans PA-C.     Jessica Berger a 62y.o. female presenting for a follow up visit.  She was last seen in April 2019.  At that time, we had testing that was positive to trees, weeds, mold, and dust mites.  We stopped her Flonase and continued with Claritin.  We started Xhance 2 sprays per nostril twice daily.  For her ulcerative colitis, we deferred on testing for food since it was not indicated.  She had no symptoms of anaphylaxis with any particular food.  Since the last visit, she has mostly done well. She remains on the Flonase twice daily and the Claritin. In any case she is doing well. She has not needed antibiotics in quite some time. She does not feel  that she needs shots at all. She had tried Zyrtec but it did not work. She has not tried AHuman resources officer   Otherwise, there have been no changes to her past medical history, surgical history, family history, or social history.    Review of Systems: a 14-point review of systems is pertinent for what is mentioned in HPI.  Otherwise, all other systems were negative.  Constitutional: negative other than that listed in the HPI Eyes: negative other than that listed in the HPI Ears, nose, mouth, throat, and face: negative other than that listed in the HPI Respiratory: negative other than that listed in the HPI Cardiovascular: negative other than that listed in the HPI Gastrointestinal: negative other than that listed in the HPI Genitourinary: negative other than that listed in the HPI Integument: negative other than that listed in the HPI Hematologic: negative other than that listed in the HPI Musculoskeletal: negative other than that listed in the HPI Neurological: negative other than that listed in the HPI Allergy/Immunologic: negative other than that listed in the HPI    Objective:   Blood pressure 132/84, pulse 70, resp. rate 16, last menstrual period 01/31/2002, SpO2 97 %. There is no height or weight on file to calculate BMI.   Physical Exam:  General: Alert, interactive, in no acute distress. Pleasant female.  Eyes: No conjunctival injection bilaterally, no discharge on the right, no discharge on the left and no Horner-Trantas dots present.  PERRL bilaterally. EOMI without pain. No photophobia.  Ears: Right TM pearly gray with normal light reflex, Left TM pearly gray with normal light reflex, Right TM intact without perforation and Left TM intact without perforation.  Nose/Throat: External nose within normal limits and septum midline. Turbinates markedly edematous with clear discharge. Posterior oropharynx erythematous with cobblestoning in the posterior oropharynx. Tonsils 2+ without  exudates.  Tongue without thrush. Lungs: Clear to auscultation without wheezing, rhonchi or rales. No increased work of breathing. CV: Normal S1/S2. No murmurs. Capillary refill <2 seconds.  Skin: Warm and dry, without lesions or rashes. Neuro:   Grossly intact. No focal deficits appreciated. Responsive to questions.  Diagnostic studies: none      Jessica Marvel, MD  [facility]

## 2017-11-21 NOTE — Patient Instructions (Addendum)
1. Chronic rhinitis - Testing today showed: trees, weeds, indoor molds, outdoor molds and dust mites - Avoidance measures provided. - Continue with: Claritin (loratadine) 58m tablet once daily and Flonase (fluticasone) two sprays per nostril daily (up to twice daily if needed) - Samples of Allegra provided to see if this works better than the Claritin.  - It seems that we can hold off on shots.   2. Return in about 1 year (around 11/22/2018).   Please inform u[place]of any Emergency Department visits, hospitalizations, or changes in symptoms. Call u[place]efore going to the ED for breathing or allergy symptoms since we might be able to fit you in for a sick visit. Feel free to contact u[place]anytime with any questions, problems, or concerns.  It was a pleasure to see you again today!  Websites that have reliable patient information: 1. American Academy of Asthma, Allergy, and Immunology: www.aaaai.org 2. Food Allergy Research and Education (FARE): foodallergy.org 3. Mothers of Asthmatics: http://www.asthmacommunitynetwork.org 4. American College of Allergy, Asthma, and Immunology: wMonthlyElectricBill.co.uk  Make sure you are registered to vote! If you have moved or changed any of your contact information, you will need to get this updated before voting!

## 2018-01-15 DIAGNOSIS — G4733 Obstructive sleep apnea (adult) (pediatric): Secondary | ICD-10-CM | POA: Diagnosis not present

## 2018-02-06 DIAGNOSIS — F411 Generalized anxiety disorder: Secondary | ICD-10-CM | POA: Diagnosis not present

## 2018-02-06 DIAGNOSIS — F3132 Bipolar disorder, current episode depressed, moderate: Secondary | ICD-10-CM | POA: Diagnosis not present

## 2018-02-08 ENCOUNTER — Other Ambulatory Visit: Payer: Self-pay | Admitting: Certified Nurse Midwife

## 2018-02-08 DIAGNOSIS — Z1231 Encounter for screening mammogram for malignant neoplasm of breast: Secondary | ICD-10-CM

## 2018-02-12 ENCOUNTER — Encounter: Payer: Self-pay | Admitting: Adult Health

## 2018-02-12 ENCOUNTER — Telehealth: Payer: Self-pay

## 2018-02-12 DIAGNOSIS — Z9989 Dependence on other enabling machines and devices: Principal | ICD-10-CM

## 2018-02-12 DIAGNOSIS — G4733 Obstructive sleep apnea (adult) (pediatric): Secondary | ICD-10-CM

## 2018-02-12 NOTE — Telephone Encounter (Signed)
Community message sent via Epic to [facility], Sherrie George re: new CPAP order placed.

## 2018-02-12 NOTE — Telephone Encounter (Signed)
Pt has left a message stating that her machine is broken and that  [facility] has told her to get in touch with Dr. Rexene Alberts for a new Rx. It looks like she saw you on 07/2017 and [place] on 07/2016.  Her last sleep study was in 2015. Do we need to have her come in for another sleep study or just write her a Rx for new machine and supplies.

## 2018-02-12 NOTE — Addendum Note (Signed)
Addended by: Trudie Buckler on: 02/12/2018 03:41 PM   Modules accepted: Orders

## 2018-02-12 NOTE — Telephone Encounter (Signed)
Order placed for new machine. We will see if ins will cover without another sleep study. RN will fax order to DME.

## 2018-02-13 ENCOUNTER — Other Ambulatory Visit: Payer: Self-pay | Admitting: Adult Health

## 2018-02-13 DIAGNOSIS — G4733 Obstructive sleep apnea (adult) (pediatric): Secondary | ICD-10-CM

## 2018-02-13 DIAGNOSIS — Z9989 Dependence on other enabling machines and devices: Principal | ICD-10-CM

## 2018-02-13 DIAGNOSIS — K760 Fatty (change of) liver, not elsewhere classified: Secondary | ICD-10-CM | POA: Diagnosis not present

## 2018-02-13 DIAGNOSIS — K219 Gastro-esophageal reflux disease without esophagitis: Secondary | ICD-10-CM | POA: Diagnosis not present

## 2018-02-13 DIAGNOSIS — K580 Irritable bowel syndrome with diarrhea: Secondary | ICD-10-CM | POA: Diagnosis not present

## 2018-02-13 NOTE — Progress Notes (Signed)
Community message sent Via Epic to [facility] to see new CPAP orders.

## 2018-02-13 NOTE — Progress Notes (Signed)
Orders placed.

## 2018-02-14 ENCOUNTER — Telehealth: Payer: Self-pay | Admitting: *Deleted

## 2018-02-14 NOTE — Telephone Encounter (Signed)
Received this reply from Angie w/[facility]:  I have reviewed this replacement pap order. However the pat is not eligible for a new unit at this time. She received her current unit through [facility] on 10/29/2013. Insurance will only cover a new unit once every 5 years. With a new order and current office visit note discussing her pap usage and benefit of pap therapy she would be eligible for a new unit after 10/31/2018.  Called patient and advised her of above. She stated she had just talked to [facility] and is taking her CPAP machine to them to be checked. She stated she hopes they can fix it. She verbalized understanding, appreciation of call.

## 2018-03-08 ENCOUNTER — Ambulatory Visit: Payer: BLUE CROSS/BLUE SHIELD

## 2018-03-26 ENCOUNTER — Ambulatory Visit
Admission: RE | Admit: 2018-03-26 | Discharge: 2018-03-26 | Disposition: A | Discharge status: Home / Self Care | Payer: BLUE CROSS/BLUE SHIELD | Source: Ambulatory Visit | Attending: Certified Nurse Midwife | Admitting: Certified Nurse Midwife

## 2018-03-26 DIAGNOSIS — Z1231 Encounter for screening mammogram for malignant neoplasm of breast: Secondary | ICD-10-CM

## 2018-05-05 ENCOUNTER — Other Ambulatory Visit: Payer: Self-pay | Admitting: Physician Assistant

## 2018-05-22 DIAGNOSIS — G4733 Obstructive sleep apnea (adult) (pediatric): Secondary | ICD-10-CM | POA: Diagnosis not present

## 2018-05-23 ENCOUNTER — Ambulatory Visit: Payer: BLUE CROSS/BLUE SHIELD | Admitting: Certified Nurse Midwife

## 2018-07-11 ENCOUNTER — Encounter: Payer: Self-pay | Admitting: Adult Health

## 2018-07-12 ENCOUNTER — Telehealth: Payer: Self-pay | Admitting: *Deleted

## 2018-07-12 NOTE — Telephone Encounter (Signed)
Called pt. Due to current COVID 19 pandemic, our office is severely reducing in office visits until further notice, in order to minimize the risk to our patients and healthcare providers.  Pt understands that although there may be some limitations with this type of visit, we will take all precautions to reduce any security or privacy concerns.  Pt understands that this will be treated like an in office visit and we will file with pt's insurance, and there may be a patient responsible charge related to this service.  She consented to my chart VV.

## 2018-07-17 ENCOUNTER — Encounter: Payer: Self-pay | Admitting: Adult Health

## 2018-07-17 ENCOUNTER — Telehealth (INDEPENDENT_AMBULATORY_CARE_PROVIDER_SITE_OTHER): Payer: BC Managed Care – PPO | Admitting: Adult Health

## 2018-07-17 DIAGNOSIS — Z9989 Dependence on other enabling machines and devices: Secondary | ICD-10-CM

## 2018-07-17 DIAGNOSIS — G4733 Obstructive sleep apnea (adult) (pediatric): Secondary | ICD-10-CM | POA: Diagnosis not present

## 2018-07-17 NOTE — Progress Notes (Signed)
PATIENT: Jessica Berger DOB: [birth year]  REASON FOR VISIT: follow up HISTORY FROM: patient  Virtual Visit via Video Note  I connected with Jessica Berger on 07/17/18 at  2:30 PM EDT by a video enabled telemedicine application located remotely at [facility] and verified that I am speaking with the correct person using two identifiers who was located at their own home.   I discussed the limitations of evaluation and management by telemedicine and the availability of in person appointments. The patient expressed understanding and agreed to proceed.   PATIENT: Jessica Berger DOB: Feb 25, 1955  REASON FOR VISIT: follow up HISTORY FROM: patient  HISTORY OF PRESENT ILLNESS: Today 07/17/18: Jessica Berger is a 63 year old female with a history of obstructive sleep apnea on CPAP.  Her download indicates that she used her machine 29 out of 30 days for compliance of 97%.  She use her machine greater than 4 hours each night.  On average she uses her machine 10 hours and 34 minutes.  Her residual AHI is 0.4 on 8 cm of water with an EPR 3.  Her leak in the 95th percentile is 0.7 L/min.  Overall she is doing well.  States that she does not want to sleep without the machine.  She denies any new issues.   REVIEW OF SYSTEMS: Out of a complete 14 system review of symptoms, the patient complains only of the following symptoms, and all other reviewed systems are negative.  See HPI  ALLERGIES: Allergies  Allergen Reactions  . Penicillins Hives  . Sulfa Antibiotics Hives  . Xylocaine [Lidocaine] Other (See Comments)    Doesn't numb    HOME MEDICATIONS: Outpatient Medications Prior to Visit  Medication Sig Dispense Refill  . belladonna alk-PHENObarbital (DONNATAL) 16.2 MG tablet Take 1 tablet by mouth as needed.    . Cholecalciferol (VITAMIN D PO) Take by mouth.    . clonazePAM (KLONOPIN) 0.5 MG tablet Take 1 tablet by mouth daily.    Marland Kitchen lamoTRIgine (LAMICTAL) 100 MG tablet      . LATUDA 80 MG TABS tablet Take 1 tablet by mouth daily.    Marland Kitchen loperamide (IMODIUM) 2 MG capsule Take 2 mg by mouth 4 (four) times daily as needed for diarrhea or loose stools.    Marland Kitchen loratadine (CLARITIN) 10 MG tablet Take 10 mg by mouth daily.    Marland Kitchen NEXIUM 40 MG capsule Take 40 mg by mouth daily at 12 noon.     . propranolol (INDERAL) 20 MG tablet Take 1 tablet by mouth 2 (two) times daily.     Marland Kitchen SYNTHROID 112 MCG tablet TAKE 1 TABLET DAILY 90 tablet 0  . vitamin B-12 (CYANOCOBALAMIN) 1000 MCG tablet Take 1,000 mcg by mouth daily.     No facility-administered medications prior to visit.     PAST MEDICAL HISTORY: Past Medical History:  Diagnosis Date  . Anemia, unspecified 04/18/2012  . Anxiety   . Bipolar 2 disorder ([place])   . Depression   . History of chickenpox   . Hypercholesteremia   . Hypothyroidism 02/01/2012  . Ischemic colitis ([place]) 2010  . Menopause   . Migraine   . OSA on CPAP   . Ulcerative colitis ([place]) 02/01/2012    PAST SURGICAL HISTORY: Past Surgical History:  Procedure Laterality Date  . CATARACT EXTRACTION W/ INTRAOCULAR LENS IMPLANT Left 03/14/2016  . CATARACT EXTRACTION W/ INTRAOCULAR LENS IMPLANT Right 04/18/2016  . NASAL SEPTUM SURGERY      FAMILY  HISTORY: Family History  Problem Relation Age of Onset  . Colon cancer Father 69       Colon  . Hypertension Father   . Breast cancer Maternal Grandmother   . Heart failure Mother   . Osteoarthritis Mother   . Depression Mother   . Hypertension Mother   . Stroke Mother   . Asthma Mother   . Cancer Paternal Grandmother        skin cancer, nose  . Heart attack Paternal Grandfather   . Cancer Maternal Grandfather        lung  . Angioedema Neg Hx   . Allergic rhinitis Neg Hx   . Eczema Neg Hx   . Urticaria Neg Hx     SOCIAL HISTORY: Social History   Socioeconomic History  . Marital status: Married    Spouse name: Not on file  . Number of children: 1  . Years of education: BA  . Highest  education level: Not on file  Occupational History  . Occupation: N/A  Social Needs  . Financial resource strain: Not on file  . Food insecurity    Worry: Not on file    Inability: Not on file  . Transportation needs    Medical: Not on file    Non-medical: Not on file  Tobacco Use  . Smoking status: Former Smoker    Quit date: 04/25/1991    Years since quitting: 27.2  . Smokeless tobacco: Never Used  Substance and Sexual Activity  . Alcohol use: Yes    Alcohol/week: 6.0 standard drinks    Types: 6 Standard drinks or equivalent per week    Comment: alcohol about 6 drinks a week  . Drug use: No  . Sexual activity: Yes    Partners: Male    Birth control/protection: Post-menopausal, Other-see comments    Comment: husband vasectomy  Lifestyle  . Physical activity    Days per week: Not on file    Minutes per session: Not on file  . Stress: Not on file  Relationships  . Social Herbalist on phone: Not on file    Gets together: Not on file    Attends religious service: Not on file    Active member of club or organization: Not on file    Attends meetings of clubs or organizations: Not on file    Relationship status: Not on file  . Intimate partner violence    Fear of current or ex partner: Not on file    Emotionally abused: Not on file    Physically abused: Not on file    Forced sexual activity: Not on file  Other Topics Concern  . Not on file  Social History Narrative   2 cups of coffee a day, occasional tea       PHYSICAL EXAM Generalized: Well developed, in no acute distress   Neurological examination  Mentation: Alert oriented to time, place, history taking. Follows all commands speech and language fluent Cranial nerve II-XII:Extraocular movements were full. Facial symmetry noted. uvula tongue midline. Head turning and shoulder shrug  were normal and symmetric. Motor: Good strength throughout subjectively per patient Sensory: Sensory testing is intact to  soft touch on all 4 extremities subjectively per patient Coordination: Cerebellar testing reveals good finger-nose-finger   Reflexes: UTA  DIAGNOSTIC DATA (LABS, IMAGING, TESTING) - I reviewed patient records, labs, notes, testing and imaging myself where available.  Lab Results  Component Value Date   WBC 6.8 02/14/2017  HGB 14.9 02/14/2017   HCT 45.6 02/14/2017   MCV 97.4 02/14/2017   PLT 323.0 02/14/2017      Component Value Date/Time   NA 134 (L) 09/22/2017 1455   NA 139 05/12/2014   NA 138 04/18/2012 1346   K 4.2 09/22/2017 1455   K 4.0 04/18/2012 1346   CL 99 09/22/2017 1455   CL 104 04/18/2012 1346   CO2 28 09/22/2017 1455   CO2 25 04/18/2012 1346   GLUCOSE 115 (H) 09/22/2017 1455   GLUCOSE 93 04/18/2012 1346   BUN 11 09/22/2017 1455   BUN 10 05/12/2014   BUN 17.2 04/18/2012 1346   CREATININE 0.92 09/22/2017 1455   CREATININE 1.0 04/18/2012 1346   CALCIUM 9.6 09/22/2017 1455   CALCIUM 9.1 04/18/2012 1346   PROT 6.5 09/22/2017 1455   PROT 6.8 04/18/2012 1346   ALBUMIN 4.2 09/22/2017 1455   ALBUMIN 3.6 04/18/2012 1346   AST 28 09/22/2017 1455   AST 15 04/18/2012 1346   ALT 31 09/22/2017 1455   ALT 12 04/18/2012 1346   ALKPHOS 66 09/22/2017 1455   ALKPHOS 78 04/18/2012 1346   BILITOT 0.6 09/22/2017 1455   BILITOT 0.38 04/18/2012 1346   Lab Results  Component Value Date   CHOL 224 (H) 02/14/2017   HDL 56.60 02/14/2017   LDLCALC 131 (H) 02/14/2017   TRIG 182.0 (H) 02/14/2017   CHOLHDL 4 02/14/2017   Lab Results  Component Value Date   HGBA1C 5.5 02/15/2017   Lab Results  Component Value Date   SKAJGOTL57 262 08/12/2014   Lab Results  Component Value Date   TSH 1.04 09/22/2017      ASSESSMENT AND PLAN 63 y.o. year old female  has a past medical history of Anemia, unspecified (04/18/2012), Anxiety, Bipolar 2 disorder ([place]), Depression, History of chickenpox, Hypercholesteremia, Hypothyroidism (02/01/2012), Ischemic colitis ([place]) (2010), Menopause,  Migraine, OSA on CPAP, and Ulcerative colitis ([place]) (02/01/2012). here with:  1.  Obstructive sleep apnea on CPAP  Overall the patient is doing well.  Her CPAP download indicates good compliance and treatment of her apnea.  She is encouraged to continue using the CPAP nightly and greater than 4 hours each night.  Advised that if her symptoms worsen or she develops new symptoms she should let us know.  We will follow-up in 1 year or sooner if needed.   I spent 15 minutes with the patient this time was spent reviewing her CPAP download.   Ward Givens, MSN, NP-C 07/17/2018, 2:25 PM [facility] [address], [place], [place] [address] [phone]

## 2018-08-03 ENCOUNTER — Other Ambulatory Visit: Payer: Self-pay | Admitting: Physician Assistant

## 2018-08-09 DIAGNOSIS — L821 Other seborrheic keratosis: Secondary | ICD-10-CM | POA: Diagnosis not present

## 2018-08-09 DIAGNOSIS — D2262 Melanocytic nevi of left upper limb, including shoulder: Secondary | ICD-10-CM | POA: Diagnosis not present

## 2018-08-09 DIAGNOSIS — L570 Actinic keratosis: Secondary | ICD-10-CM | POA: Diagnosis not present

## 2018-08-09 DIAGNOSIS — L720 Epidermal cyst: Secondary | ICD-10-CM | POA: Diagnosis not present

## 2018-08-09 DIAGNOSIS — D2261 Melanocytic nevi of right upper limb, including shoulder: Secondary | ICD-10-CM | POA: Diagnosis not present

## 2018-08-09 DIAGNOSIS — D485 Neoplasm of uncertain behavior of skin: Secondary | ICD-10-CM | POA: Diagnosis not present

## 2018-08-09 DIAGNOSIS — D225 Melanocytic nevi of trunk: Secondary | ICD-10-CM | POA: Diagnosis not present

## 2018-08-13 DIAGNOSIS — Z20828 Contact with and (suspected) exposure to other viral communicable diseases: Secondary | ICD-10-CM | POA: Diagnosis not present

## 2018-08-14 ENCOUNTER — Encounter: Payer: Self-pay | Admitting: Family Medicine

## 2018-08-15 ENCOUNTER — Other Ambulatory Visit: Payer: Self-pay

## 2018-08-15 ENCOUNTER — Encounter: Payer: Self-pay | Admitting: Family Medicine

## 2018-08-15 ENCOUNTER — Ambulatory Visit (INDEPENDENT_AMBULATORY_CARE_PROVIDER_SITE_OTHER): Payer: BC Managed Care – PPO | Admitting: Family Medicine

## 2018-08-15 VITALS — BP 132/92 | HR 83 | Ht 67.0 in | Wt 234.0 lb

## 2018-08-15 DIAGNOSIS — F411 Generalized anxiety disorder: Secondary | ICD-10-CM | POA: Diagnosis not present

## 2018-08-15 DIAGNOSIS — H6690 Otitis media, unspecified, unspecified ear: Secondary | ICD-10-CM | POA: Diagnosis not present

## 2018-08-15 DIAGNOSIS — F3132 Bipolar disorder, current episode depressed, moderate: Secondary | ICD-10-CM | POA: Diagnosis not present

## 2018-08-15 NOTE — Progress Notes (Signed)
I have discussed the procedure for the virtual visit with the patient who has given consent to proceed with assessment and treatment.   Jessica L Brodmerkel, CMA     

## 2018-08-15 NOTE — Progress Notes (Signed)
Virtual Visit via Video   I connected with patient on 08/15/18 at  1:00 PM EDT by a video enabled telemedicine application and verified that I am speaking with the correct person using two identifiers.  Location patient: Home Location provider: Acupuncturist, Office Persons participating in the virtual visit: Patient, Provider, [place] (Jess B)  I discussed the limitations of evaluation and management by telemedicine and the availability of in person appointments. The patient expressed understanding and agreed to proceed.  Subjective:   HPI:   Ear pain- 'currently i'm fine b/c I took ES tylenol'.  Pt reports a 'sharp electrical pain' at R temple.  Pt reports no fever 'for 4-5 days'.  Tm 100.8.  + sore throat, ear pain.  Has swollen lymph node under R ear.  No pain w/ manipulation of pinna.  No TTP over temporal artery.  No known sick contacts.  No recent swimming.  No recent hx of ear infxns.  Pt has hx of migraines- the first 1-2 days felt similar but since then it is just R ear.  No drainage from ear.  sxs started 'as an ulcer on the back of my throat'.  No cough, body aches, N/V.  ROS:   See pertinent positives and negatives per HPI.  Patient Active Problem List   Diagnosis Date Noted   Seasonal and perennial allergic rhinitis 05/18/2017   Easy bruising 07/18/2016   Hypothyroidism 07/15/2016   Bipolar 2 disorder ([place])    Ischemic colitis ([place])    Anemia, unspecified 04/18/2012   Ulcerative colitis ([place]) 02/01/2012    Social History   Tobacco Use   Smoking status: Former Smoker    Quit date: 04/25/1991    Years since quitting: 27.3   Smokeless tobacco: Never Used  Substance Use Topics   Alcohol use: Yes    Alcohol/week: 6.0 standard drinks    Types: 6 Standard drinks or equivalent per week    Comment: alcohol about 6 drinks a week    Current Outpatient Medications:    belladonna alk-PHENObarbital (DONNATAL) 16.2 MG tablet, Take 1 tablet by mouth as  needed., Disp: , Rfl:    Cholecalciferol (VITAMIN D PO), Take by mouth., Disp: , Rfl:    clonazePAM (KLONOPIN) 0.5 MG tablet, Take 1 tablet by mouth daily., Disp: , Rfl:    lamoTRIgine (LAMICTAL) 100 MG tablet, , Disp: , Rfl:    LATUDA 80 MG TABS tablet, Take 1 tablet by mouth daily., Disp: , Rfl:    loperamide (IMODIUM) 2 MG capsule, Take 2 mg by mouth 4 (four) times daily as needed for diarrhea or loose stools., Disp: , Rfl:    loratadine (CLARITIN) 10 MG tablet, Take 10 mg by mouth daily., Disp: , Rfl:    NEXIUM 40 MG capsule, Take 40 mg by mouth daily at 12 noon. , Disp: , Rfl:    propranolol (INDERAL) 20 MG tablet, Take 1 tablet by mouth 2 (two) times daily. , Disp: , Rfl:    SYNTHROID 112 MCG tablet, TAKE 1 TABLET DAILY, Disp: 90 tablet, Rfl: 0   vitamin B-12 (CYANOCOBALAMIN) 1000 MCG tablet, Take 1,000 mcg by mouth daily., Disp: , Rfl:   Allergies  Allergen Reactions   Penicillins Hives   Sulfa Antibiotics Hives   Xylocaine [Lidocaine] Other (See Comments)    Doesn't numb    Objective:   BP (!) 132/92    Pulse 83    Ht 5' 7"  (1.702 m)    Wt 234 lb (106.1 kg)  LMP 01/31/2002 (Approximate)    BMI 36.65 kg/m   AAOx3, NAD, obese NCAT, EOMI No obvious CN deficits No swelling of face or neck noted Coloring WNL Pt is able to speak clearly, coherently without shortness of breath or increased work of breathing.  Thought process is linear.  Mood is appropriate.   Assessment and Plan:   Otitis media- new.  Pt's sxs are consistent w/ infection.  Discussed viral vs bacterial but given the duration, will start abx and treat as bacterial.  Given PCN allergy, will start Doxy.  Reviewed sxs of COVID and encouraged her to let us know ASAP if her sxs were to change.  Reviewed supportive care and red flags that should prompt return.  Pt expressed understanding and is in agreement w/ plan.    Annye Asa, MD 08/15/2018

## 2018-08-16 MED ORDER — DOXYCYCLINE HYCLATE 100 MG PO TABS: 100.0000 mg | ORAL_TABLET | Freq: Two times a day (BID) | ORAL | 0 refills | Status: DC

## 2018-08-16 NOTE — Addendum Note (Signed)
Addended by: Midge Minium on: 08/16/2018 08:14 AM   Modules accepted: Orders

## 2018-08-19 ENCOUNTER — Encounter: Payer: Self-pay | Admitting: Family Medicine

## 2018-08-20 ENCOUNTER — Emergency Department (HOSPITAL_BASED_OUTPATIENT_CLINIC_OR_DEPARTMENT_OTHER)
Admission: EM | Admit: 2018-08-20 | Discharge: 2018-08-20 | Disposition: A | Discharge status: Home / Self Care | Payer: BC Managed Care – PPO | Attending: Emergency Medicine | Admitting: Emergency Medicine

## 2018-08-20 ENCOUNTER — Encounter (HOSPITAL_BASED_OUTPATIENT_CLINIC_OR_DEPARTMENT_OTHER): Payer: Self-pay | Admitting: *Deleted

## 2018-08-20 ENCOUNTER — Other Ambulatory Visit: Payer: Self-pay

## 2018-08-20 DIAGNOSIS — E039 Hypothyroidism, unspecified: Secondary | ICD-10-CM | POA: Diagnosis not present

## 2018-08-20 DIAGNOSIS — H5789 Other specified disorders of eye and adnexa: Secondary | ICD-10-CM | POA: Diagnosis not present

## 2018-08-20 DIAGNOSIS — Z87891 Personal history of nicotine dependence: Secondary | ICD-10-CM | POA: Diagnosis not present

## 2018-08-20 DIAGNOSIS — G4733 Obstructive sleep apnea (adult) (pediatric): Secondary | ICD-10-CM | POA: Diagnosis not present

## 2018-08-20 DIAGNOSIS — Z79899 Other long term (current) drug therapy: Secondary | ICD-10-CM | POA: Insufficient documentation

## 2018-08-20 DIAGNOSIS — G510 Bell's palsy: Secondary | ICD-10-CM | POA: Diagnosis not present

## 2018-08-20 DIAGNOSIS — G501 Atypical facial pain: Secondary | ICD-10-CM | POA: Diagnosis not present

## 2018-08-20 DIAGNOSIS — R200 Anesthesia of skin: Secondary | ICD-10-CM | POA: Diagnosis not present

## 2018-08-20 MED ORDER — VALACYCLOVIR HCL 1 G PO TABS: 1000.0000 mg | ORAL_TABLET | Freq: Three times a day (TID) | ORAL | 0 refills | Status: DC

## 2018-08-20 MED ORDER — TETRACAINE HCL 0.5 % OP SOLN
OPHTHALMIC | Status: AC
  Filled 2018-08-20: qty 8

## 2018-08-20 MED ORDER — FLUORESCEIN SODIUM 1 MG OP STRP
1.0000 | ORAL_STRIP | Freq: Once | OPHTHALMIC | Status: AC
  Administered 2018-08-20: 1 via OPHTHALMIC

## 2018-08-20 MED ORDER — PREDNISONE 20 MG PO TABS: 60.0000 mg | ORAL_TABLET | Freq: Every day | ORAL | 0 refills | Status: AC

## 2018-08-20 MED ORDER — FLUORESCEIN SODIUM 1 MG OP STRP
ORAL_STRIP | OPHTHALMIC | Status: AC
  Filled 2018-08-20: qty 1

## 2018-08-20 MED ORDER — TETRACAINE HCL 0.5 % OP SOLN
2.0000 [drp] | Freq: Once | OPHTHALMIC | Status: AC
  Administered 2018-08-20: 2 [drp] via OPHTHALMIC

## 2018-08-20 MED FILL — valACYclovir HCL 1 GM TABS: 1 | 7 days supply | Qty: 21 | Fill #0

## 2018-08-20 MED FILL — predniSONE 20 MG TABS: 20 | 7 days supply | Qty: 21 | Fill #0

## 2018-08-20 NOTE — Discharge Instructions (Addendum)
Take the medications as prescribed.  Make sure that you are taking an acid reducing medicine while taking the prednisone.  Use the eye lubricating drops as directed.  Follow-up with your primary care doctor.  Return here as needed for any worsening symptoms.

## 2018-08-20 NOTE — ED Notes (Signed)
ED Provider at bedside. 

## 2018-08-20 NOTE — Telephone Encounter (Signed)
Called pt on all lines and Left detailed messages to advise of Dr. Virgil Benedict recommendations.

## 2018-08-20 NOTE — ED Triage Notes (Signed)
Pt sent here by PMD office for ? Bells palsy, c/o right eye irritation , not blinking x 2 days

## 2018-08-20 NOTE — ED Provider Notes (Signed)
[facility] EMERGENCY DEPARTMENT Provider Note   CSN: [id number] Arrival date & time: 08/20/18  1520     History   Chief Complaint Chief Complaint  Patient presents with  . Facial numbness    HPI Jessica Berger is a 63 y.o. female.     Patient is a 63 year old female who presents with facial numbness.  She states that she had an ear infection about 2 to 3 days ago.  She had a telehealth visit from her PCP.  At that time she was having pain in her right ear.  She was started on doxycycline which she has been taking.  Overall she says the pain from her ear has improved and she is overall feeling better but she does have some soreness on the whole right side of her face.  She noticed last night that she could not pucker her lips normally.  She woke up this morning and noticed that she had some drooping of the right side of her face and could not spit her toothpaste out normally.  She also noticed that her eye was watery and she was not able to close her eye all the way.  She denies any fevers.  No headaches.  No vision changes.  No history of similar symptoms in the past.     Past Medical History:  Diagnosis Date  . Anemia, unspecified 04/18/2012  . Anxiety   . Bipolar 2 disorder ([place])   . Depression   . History of chickenpox   . Hypercholesteremia   . Hypothyroidism 02/01/2012  . Ischemic colitis ([place]) 2010  . Menopause   . Migraine   . OSA on CPAP   . Ulcerative colitis ([place]) 02/01/2012    Patient Active Problem List   Diagnosis Date Noted  . Seasonal and perennial allergic rhinitis 05/18/2017  . Easy bruising 07/18/2016  . Hypothyroidism 07/15/2016  . Bipolar 2 disorder ([place])   . Ischemic colitis ([place])   . Anemia, unspecified 04/18/2012  . Ulcerative colitis ([place]) 02/01/2012    Past Surgical History:  Procedure Laterality Date  . CATARACT EXTRACTION W/ INTRAOCULAR LENS IMPLANT Left 03/14/2016  . CATARACT EXTRACTION W/ INTRAOCULAR LENS IMPLANT Right  04/18/2016  . NASAL SEPTUM SURGERY       OB History    Gravida  1   Para  1   Term  1   Preterm  0   AB  0   Living  1     SAB  0   TAB  0   Ectopic  0   Multiple  0   Live Births  1            Home Medications    Prior to Admission medications   Medication Sig Start Date End Date Taking? Authorizing Provider  belladonna alk-PHENObarbital (DONNATAL) 16.2 MG tablet Take 1 tablet by mouth as needed.    [provider]  Cholecalciferol (VITAMIN D PO) Take by mouth.    [provider]  clonazePAM (KLONOPIN) 0.5 MG tablet Take 1 tablet by mouth daily. 10/20/15   [provider]  doxycycline (VIBRA-TABS) 100 MG tablet Take 1 tablet (100 mg total) by mouth 2 (two) times daily. 08/16/18   Midge Minium, MD  lamoTRIgine (LAMICTAL) 100 MG tablet  09/24/17   [provider]  LATUDA 80 MG TABS tablet Take 1 tablet by mouth daily. 07/05/15   [provider]  loperamide (IMODIUM) 2 MG capsule Take 2 mg by mouth  4 (four) times daily as needed for diarrhea or loose stools.    [provider]  loratadine (CLARITIN) 10 MG tablet Take 10 mg by mouth daily.    [provider]  NEXIUM 40 MG capsule Take 40 mg by mouth daily at 12 noon.  03/27/12   [provider]  predniSONE (DELTASONE) 20 MG tablet Take 3 tablets (60 mg total) by mouth daily with breakfast for 7 days. 08/20/18 08/27/18  Malvin Johns, MD  propranolol (INDERAL) 20 MG tablet Take 1 tablet by mouth 2 (two) times daily.  11/16/15   [provider]  SYNTHROID 112 MCG tablet TAKE 1 TABLET DAILY 08/06/18   Brunetta Jeans, PA-C  valACYclovir (VALTREX) 1000 MG tablet Take 1 tablet (1,000 mg total) by mouth 3 (three) times daily. 08/20/18   Malvin Johns, MD  vitamin B-12 (CYANOCOBALAMIN) 1000 MCG tablet Take 1,000 mcg by mouth daily.    [provider]    Family History Family History  Problem Relation Age of Onset  . Colon cancer  Father 81       Colon  . Hypertension Father   . Breast cancer Maternal Grandmother   . Heart failure Mother   . Osteoarthritis Mother   . Depression Mother   . Hypertension Mother   . Stroke Mother   . Asthma Mother   . Cancer Paternal Grandmother        skin cancer, nose  . Heart attack Paternal Grandfather   . Cancer Maternal Grandfather        lung  . Angioedema Neg Hx   . Allergic rhinitis Neg Hx   . Eczema Neg Hx   . Urticaria Neg Hx     Social History Social History   Tobacco Use  . Smoking status: Former Smoker    Quit date: 04/25/1991    Years since quitting: 27.3  . Smokeless tobacco: Never Used  Substance Use Topics  . Alcohol use: Yes    Alcohol/week: 6.0 standard drinks    Types: 6 Standard drinks or equivalent per week    Comment: alcohol about 6 drinks a week  . Drug use: No     Allergies   Penicillins, Sulfa antibiotics, and Xylocaine [lidocaine]   Review of Systems Review of Systems  Constitutional: Negative for chills, diaphoresis, fatigue and fever.  HENT: Positive for ear pain. Negative for congestion, rhinorrhea and sneezing.   Eyes: Positive for discharge and redness.  Respiratory: Negative for cough, chest tightness and shortness of breath.   Cardiovascular: Negative for chest pain and leg swelling.  Gastrointestinal: Negative for abdominal pain, blood in stool, diarrhea, nausea and vomiting.  Genitourinary: Negative for difficulty urinating, flank pain, frequency and hematuria.  Musculoskeletal: Negative for arthralgias and back pain.  Skin: Negative for rash.  Neurological: Positive for facial asymmetry. Negative for dizziness, speech difficulty, weakness, numbness and headaches.     Physical Exam Updated Vital Signs BP (!) 153/97   Pulse 93   Temp 98.3 F (36.8 C) (Oral)   Resp 16   Ht 5' 7"  (1.702 m)   Wt 106.1 kg   LMP 01/31/2002 (Approximate)   SpO2 99%   BMI 36.65 kg/m   Physical Exam Constitutional:       Appearance: She is well-developed.  HENT:     Head: Normocephalic and atraumatic.     Right Ear: Tympanic membrane and external ear normal.     Left Ear: Ear canal and external ear normal.  Ears:     Comments: No evidence of infection.  No swelling of the ear.  No swelling of the face.  No facial rashes noted. Eyes:     Pupils: Pupils are equal, round, and reactive to light.     Comments: Mild erythema to the conjunctive of the right eye.  There is some mild watery discharge.  Neck:     Musculoskeletal: Normal range of motion and neck supple.  Cardiovascular:     Rate and Rhythm: Normal rate and regular rhythm.     Heart sounds: Normal heart sounds.  Pulmonary:     Effort: Pulmonary effort is normal. No respiratory distress.     Breath sounds: Normal breath sounds. No wheezing or rales.  Chest:     Chest wall: No tenderness.  Abdominal:     General: Bowel sounds are normal.     Palpations: Abdomen is soft.     Tenderness: There is no abdominal tenderness. There is no guarding or rebound.  Musculoskeletal: Normal range of motion.  Lymphadenopathy:     Cervical: No cervical adenopathy.  Skin:    General: Skin is warm and dry.     Findings: No rash.  Neurological:     Mental Status: She is alert and oriented to person, place, and time.     Comments: She is unable to completely close the right eyelid.  She has some right-sided facial drooping.  She has normal sensation to light touch in all nerve distributions of the face.  She has normal motor function and sensation in all extremities.      ED Treatments / Results  Labs (all labs ordered are listed, but only abnormal results are displayed) Labs Reviewed - No data to display  EKG None  Radiology No results found.  Procedures Procedures (including critical care time)  Medications Ordered in ED Medications  fluorescein 1 MG ophthalmic strip (has no administration in time range)  tetracaine (PONTOCAINE) 0.5 %  ophthalmic solution (has no administration in time range)  tetracaine (PONTOCAINE) 0.5 % ophthalmic solution 2 drop (2 drops Right Eye Given 08/20/18 1549)  fluorescein ophthalmic strip 1 strip (1 strip Right Eye Given 08/20/18 1549)     Initial Impression / Assessment and Plan / ED Course  I have reviewed the triage vital signs and the nursing notes.  Pertinent labs & imaging results that were available during my care of the patient were reviewed by me and considered in my medical decision making (see chart for details).       Patient is a 63 year old female who presents with right-sided facial drooping.  Her exam is consistent with Bell's palsy.  She does not have other findings that would be more concerning for stroke.  She has normal gait.  She does not have any visible evidence of shingles although she is having facial pain which is a little concerning for that.  There is no rash noted.  I did stain her conjunctivo-and I do not see any dendrites in the eye.  She was discharged home in good condition.  She was encouraged to have follow-up with her PCP for reevaluation.  She was started on prednisone and Valtrex.  She was advised to use an acid reducer while taking the prednisone.  Return precautions were given.  She was also advised and eye care with use of eyedrops during the day and lubricating gel at night.  Final Clinical Impressions(s) / ED Diagnoses   Final diagnoses:  Bell's palsy  ED Discharge Orders         Ordered    valACYclovir (VALTREX) 1000 MG tablet  3 times daily     08/20/18 1649    predniSONE (DELTASONE) 20 MG tablet  Daily with breakfast     08/20/18 1649           Malvin Johns, MD 08/20/18 1654

## 2018-09-04 ENCOUNTER — Other Ambulatory Visit: Payer: Self-pay

## 2018-09-04 ENCOUNTER — Other Ambulatory Visit (HOSPITAL_COMMUNITY)
Admission: RE | Admit: 2018-09-04 | Discharge: 2018-09-04 | Disposition: A | Discharge status: Home / Self Care | Payer: BC Managed Care – PPO | Source: Ambulatory Visit | Attending: Certified Nurse Midwife | Admitting: Certified Nurse Midwife

## 2018-09-04 ENCOUNTER — Ambulatory Visit: Payer: BC Managed Care – PPO | Admitting: Certified Nurse Midwife

## 2018-09-04 ENCOUNTER — Encounter: Payer: Self-pay | Admitting: Certified Nurse Midwife

## 2018-09-04 VITALS — BP 116/80 | HR 72 | Temp 97.4°F | Resp 16 | Ht 66.25 in | Wt 244.0 lb

## 2018-09-04 DIAGNOSIS — Z01419 Encounter for gynecological examination (general) (routine) without abnormal findings: Secondary | ICD-10-CM

## 2018-09-04 DIAGNOSIS — Z124 Encounter for screening for malignant neoplasm of cervix: Secondary | ICD-10-CM | POA: Diagnosis not present

## 2018-09-04 NOTE — Progress Notes (Signed)
63 y.o. G69P1001 Married  Caucasian Fe here for annual exam. Menopausal no HRT. Denies vaginal bleeding or vaginal dryness. Recovering from ear infection on right side with Bell's Palsy and now resolving. Aware she has gained weight since illness with ear infection. Feeling much better now. Sees PCP for labs, hypothyroid, Bipolar, Depression, Migraine management, all stable at present. Planning fall vacation hopefully!  Patient's last menstrual period was 01/31/2002 (approximate).          Sexually active: No.  The current method of family planning is vasectomy.    Exercising: Yes.    workout dvd's Smoker:  no  Review of Systems  Constitutional: Negative.   HENT: Negative.   Eyes: Negative.   Respiratory: Negative.   Cardiovascular: Negative.   Gastrointestinal: Negative.   Genitourinary: Negative.   Musculoskeletal: Negative.   Skin: Negative.   Neurological: Negative.   Endo/Heme/Allergies: Negative.   Psychiatric/Behavioral: Negative.     Health Maintenance: Pap:  05-12-15 neg HPV HR neg History of Abnormal Pap: no MMG:  03-26-2018 category b density birads 1:neg Self Breast exams: no Colonoscopy:  2019 f/u 8yr BMD:   2018 repeat 3 years TDaP:  2014 Shingles: not done Pneumonia: not done Hep C and HIV: both neg 2017 Labs: yes   reports that she quit smoking about 27 years ago. She has never used smokeless tobacco. She reports current alcohol use of about 6.0 standard drinks of alcohol per week. She reports that she does not use drugs.  Past Medical History:  Diagnosis Date  . Anemia, unspecified 04/18/2012  . Anxiety   . [place] 2 [place]   . Depression   . History of chickenpox   . Hypercholesteremia   . Hypothyroidism 02/01/2012  . [place]schemic colitis ([place] 2010  . Menopause   . Migraine   . OSA on CPAP   . Ulcerative colitis ([facility] [place] Colon 02/01/2012    Past Surgical History:  Procedure Laterality Date  . CATARACT EXTRACTION W/ INTRAOCULAR LENS IMPLANT Left  03/14/2016  . CATARACT EXTRACTION W/ INTRAOCULAR LENS IMPLANT Right 04/18/2016  . NASAL SEPTUM SURGERY      Current Outpatient Medications  Medication Sig Dispense Refill  . belladonna alk-PHENObarbital (DONNATAL) 16.2 MG tablet Take 1 tablet by mouth as needed.    . Cholecalciferol (VITAMIN D PO) Take by mouth.    . clonazePAM (KLONOPIN) 0.5 MG tablet Take 1 tablet by mouth daily.    .Marland Kitchendoxycycline (VIBRA-TABS) 100 MG tablet Take 1 tablet (100 mg total) by mouth 2 (two) times daily. 20 tablet 0  . lamoTRIgine (LAMICTAL) 100 MG tablet     . LATUDA 80 MG TABS tablet Take 1 tablet by mouth daily.    .Marland Kitchenloperamide (IMODIUM) 2 MG capsule Take 2 mg by mouth 4 (four) times daily as needed for diarrhea or loose stools.    .Marland Kitchenloratadine (CLARITIN) 10 MG tablet Take 10 mg by mouth daily.    .Marland KitchenNEXIUM 40 MG capsule Take 40 mg by mouth daily at 12 noon.     . propranolol (INDERAL) 20 MG tablet Take 1 tablet by mouth 2 (two) times daily.     .Marland KitchenSYNTHROID 112 MCG tablet TAKE 1 TABLET DAILY 90 tablet 0  . valACYclovir (VALTREX) 1000 MG tablet Take 1 tablet (1,000 mg total) by mouth 3 (three) times daily. 21 tablet 0  . vitamin B-12 (CYANOCOBALAMIN) 1000 MCG tablet Take 1,000 mcg by mouth daily.     No current facility-administered medications for this visit.  Family History  Problem Relation Age of Onset  . Colon cancer Father 24       Colon  . Hypertension Father   . Breast cancer Maternal Grandmother   . Heart failure Mother   . Osteoarthritis Mother   . Depression Mother   . Hypertension Mother   . Stroke Mother   . Asthma Mother   . Cancer Paternal Grandmother        skin cancer, nose  . Heart attack Paternal Grandfather   . Cancer Maternal Grandfather        lung  . Angioedema Neg Hx   . Allergic rhinitis Neg Hx   . Eczema Neg Hx   . Urticaria Neg Hx     ROS:  Pertinent items are noted in HPI.  Otherwise, a comprehensive ROS was negative.  Exam:   LMP 01/31/2002 (Approximate)     Ht Readings from Last 3 Encounters:  08/20/18 _0  (1.702 m)  08/15/18 _1  (1.702 m)  09/22/17 _2  (1.702 m)    General appearance: alert, cooperative and appears stated age Head: Normocephalic, without obvious abnormality, atraumatic Neck: no adenopathy, supple, symmetrical, trachea midline and thyroid normal to inspection and palpation Lungs: clear to auscultation bilaterally Breasts: normal appearance, no masses or tenderness, No nipple retraction or dimpling, No nipple discharge or bleeding, No axillary or supraclavicular adenopathy Heart: regular rate and rhythm Abdomen: soft, non-tender; no masses,  no organomegaly Extremities: extremities normal, atraumatic, no cyanosis or edema Skin: Skin color, texture, turgor normal. No rashes or lesions Lymph nodes: Cervical, supraclavicular, and axillary nodes normal. No abnormal inguinal nodes palpated Neurologic: Grossly normal   Pelvic: External genitalia:  no lesions              Urethra:  normal appearing urethra with no masses, tenderness or lesions              Bartholin's and Skene's: normal                 Vagina: normal appearing vagina with normal color and discharge, no lesions              Cervix: no cervical motion tenderness, no lesions and normal appearance              Pap taken: Yes.   Bimanual Exam:  Uterus:  normal size, contour, position, consistency, mobility, non-tender and anteverted              Adnexa: normal adnexa, no mass, fullness, tenderness and no large masses noted               Rectovaginal: Confirms               Anus:  normal sphincter tone, no lesions, small sebaceous noted outside anus, not red, non tender  Chaperone present: yes  A:  Well Woman with normal exam  Menopausal no HRT  Recent recovery from left ear infection and Bell's palsy  Cholesterol, Hypothyroid, Depression, Bipolar with MD management, all stable per patient  P:   Reviewed health and wellness pertinent to exam  Aware of  need to advise if vaginal bleeding.  Continue follow up as indicated with MD.  Pap smear: yes   counseled on breast self exam, mammography screening, feminine hygiene, adequate intake of calcium and vitamin D, diet and exercise, Kegel's exercises  return annually or prn  An After Visit Summary was printed and given to the patient.

## 2018-09-06 LAB — CYTOLOGY - PAP
Diagnosis: NEGATIVE
HPV: NOT DETECTED

## 2018-10-02 DIAGNOSIS — M9903 Segmental and somatic dysfunction of lumbar region: Secondary | ICD-10-CM | POA: Diagnosis not present

## 2018-11-01 ENCOUNTER — Other Ambulatory Visit: Payer: Self-pay | Admitting: Physician Assistant

## 2018-11-01 ENCOUNTER — Encounter: Payer: Self-pay | Admitting: Emergency Medicine

## 2018-11-06 ENCOUNTER — Other Ambulatory Visit: Payer: Self-pay

## 2018-11-19 DIAGNOSIS — M542 Cervicalgia: Secondary | ICD-10-CM | POA: Diagnosis not present

## 2018-11-19 DIAGNOSIS — G4733 Obstructive sleep apnea (adult) (pediatric): Secondary | ICD-10-CM | POA: Diagnosis not present

## 2018-11-20 ENCOUNTER — Encounter: Payer: Self-pay | Admitting: Adult Health

## 2018-11-20 DIAGNOSIS — Z9989 Dependence on other enabling machines and devices: Secondary | ICD-10-CM

## 2018-11-20 DIAGNOSIS — G4733 Obstructive sleep apnea (adult) (pediatric): Secondary | ICD-10-CM

## 2018-11-21 NOTE — Telephone Encounter (Signed)
New order for cpap sent by CM to [facility].  Received. sy

## 2018-11-26 DIAGNOSIS — M542 Cervicalgia: Secondary | ICD-10-CM | POA: Diagnosis not present

## 2018-12-05 ENCOUNTER — Ambulatory Visit (INDEPENDENT_AMBULATORY_CARE_PROVIDER_SITE_OTHER): Payer: BC Managed Care – PPO | Admitting: Physician Assistant

## 2018-12-05 ENCOUNTER — Other Ambulatory Visit: Payer: Self-pay

## 2018-12-05 ENCOUNTER — Encounter: Payer: Self-pay | Admitting: Physician Assistant

## 2018-12-05 VITALS — BP 120/80 | HR 68 | Temp 98.4°F | Resp 16 | Ht 67.5 in | Wt 248.0 lb

## 2018-12-05 DIAGNOSIS — Z23 Encounter for immunization: Secondary | ICD-10-CM

## 2018-12-05 DIAGNOSIS — K5180 Other ulcerative colitis without complications: Secondary | ICD-10-CM | POA: Diagnosis not present

## 2018-12-05 DIAGNOSIS — Z0000 Encounter for general adult medical examination without abnormal findings: Secondary | ICD-10-CM

## 2018-12-05 DIAGNOSIS — F3181 Bipolar II disorder: Secondary | ICD-10-CM | POA: Diagnosis not present

## 2018-12-05 DIAGNOSIS — E039 Hypothyroidism, unspecified: Secondary | ICD-10-CM | POA: Diagnosis not present

## 2018-12-05 NOTE — Progress Notes (Signed)
Patient presents to clinic today for annual exam.  Patient is fasting for labs.  Acute Concerns: No acute concerns.   Chronic Issues: Hypothyroidism -- on Synthroid 112 mcg QD, taking as directed, tolerating well. Denies changes in bowel habits, temperature tolerance.   UC-- doing well, has been feeling much better this past year  Health Maintenance: Immunizations -- flu shot today.  Colonoscopy -- UTD Mammogram -- UTD PAP- -UTD (Followed by GYN)  Past Medical History:  Diagnosis Date  . Anemia, unspecified 04/18/2012  . Anxiety   . Bell's palsy   . Bipolar 2 disorder ([place])   . Depression   . History of chickenpox   . Hypercholesteremia   . Hypothyroidism 02/01/2012  . Ischemic colitis ([place]) 2010  . Menopause   . Migraine   . OSA on CPAP   . Ulcerative colitis ([place]) 02/01/2012    Past Surgical History:  Procedure Laterality Date  . CATARACT EXTRACTION W/ INTRAOCULAR LENS IMPLANT Left 03/14/2016  . CATARACT EXTRACTION W/ INTRAOCULAR LENS IMPLANT Right 04/18/2016  . NASAL SEPTUM SURGERY      Current Outpatient Medications on File Prior to Visit  Medication Sig Dispense Refill  . belladonna alk-PHENObarbital (DONNATAL) 16.2 MG tablet Take 1 tablet by mouth as needed.    . Cholecalciferol (VITAMIN D PO) Take by mouth.    . clonazePAM (KLONOPIN) 0.5 MG tablet Take 1 tablet by mouth daily.    . fluticasone (FLONASE) 50 MCG/ACT nasal spray Place into both nostrils daily.    Marland Kitchen lamoTRIgine (LAMICTAL) 100 MG tablet     . LATUDA 80 MG TABS tablet Take 1 tablet by mouth daily.    Marland Kitchen loperamide (IMODIUM) 2 MG capsule Take 2 mg by mouth 4 (four) times daily as needed for diarrhea or loose stools.    Marland Kitchen loratadine (CLARITIN) 10 MG tablet Take 10 mg by mouth daily.    Marland Kitchen NEXIUM 40 MG capsule Take 40 mg by mouth daily at 12 noon.     . propranolol (INDERAL) 20 MG tablet Take 1 tablet by mouth 2 (two) times daily.     . Rhubarb (ESTROVEN MENOPAUSE RELIEF PO) Take 1 tablet by mouth  daily.    Marland Kitchen SYNTHROID 112 MCG tablet TAKE 1 TABLET DAILY 90 tablet 0  . vitamin B-12 (CYANOCOBALAMIN) 1000 MCG tablet Take 1,000 mcg by mouth daily.     No current facility-administered medications on file prior to visit.     Allergies  Allergen Reactions  . Demerol [Meperidine]     migraines  . Penicillins Hives  . Sulfa Antibiotics Hives  . Xylocaine [Lidocaine] Other (See Comments)    Doesn't numb    Family History  Problem Relation Age of Onset  . Colon cancer Father 15       Colon  . Hypertension Father   . Breast cancer Maternal Grandmother   . Heart failure Mother   . Osteoarthritis Mother   . Depression Mother   . Hypertension Mother   . Stroke Mother   . Asthma Mother   . Cancer Paternal Grandmother        skin cancer, nose  . Heart attack Paternal Grandfather   . Cancer Maternal Grandfather        lung  . Angioedema Neg Hx   . Allergic rhinitis Neg Hx   . Eczema Neg Hx   . Urticaria Neg Hx     Social History   Socioeconomic History  . Marital status: Married  Spouse name: Not on file  . Number of children: 1  . Years of education: BA  . Highest education level: Not on file  Occupational History  . Occupation: N/A  Social Needs  . Financial resource strain: Not on file  . Food insecurity    Worry: Not on file    Inability: Not on file  . Transportation needs    Medical: Not on file    Non-medical: Not on file  Tobacco Use  . Smoking status: Former Smoker    Quit date: 04/25/1991    Years since quitting: 27.6  . Smokeless tobacco: Never Used  Substance and Sexual Activity  . Alcohol use: Yes    Alcohol/week: 6.0 standard drinks    Types: 6 Standard drinks or equivalent per week    Comment: alcohol about 6 drinks a week  . Drug use: No  . Sexual activity: Yes    Partners: Male    Birth control/protection: Post-menopausal, Other-see comments    Comment: husband vasectomy  Lifestyle  . Physical activity    Days per week: Not on file     Minutes per session: Not on file  . Stress: Not on file  Relationships  . Social Herbalist on phone: Not on file    Gets together: Not on file    Attends religious service: Not on file    Active member of club or organization: Not on file    Attends meetings of clubs or organizations: Not on file    Relationship status: Not on file  . Intimate partner violence    Fear of current or ex partner: Not on file    Emotionally abused: Not on file    Physically abused: Not on file    Forced sexual activity: Not on file  Other Topics Concern  . Not on file  Social History Narrative   2 cups of coffee a day, occasional tea    Review of Systems  Constitutional: Negative for chills and fever.  HENT: Negative for hearing loss and tinnitus.   Eyes: Negative for blurred vision and double vision.  Respiratory: Negative for cough and shortness of breath.   Cardiovascular: Negative for chest pain.  Gastrointestinal: Positive for heartburn. Negative for abdominal pain, blood in stool, constipation, diarrhea, melena, nausea and vomiting.  Genitourinary: Negative for dysuria, frequency and urgency.  Musculoskeletal: Negative for falls.  Neurological: Negative for dizziness and loss of consciousness.  Psychiatric/Behavioral: Negative for depression. The patient is not nervous/anxious and does not have insomnia.    BP 120/80   Pulse 68   Temp 98.4 F (36.9 C) (Temporal)   Resp 16   Ht 5' 7.5" (1.715 m)   Wt 248 lb (112.5 kg)   LMP 01/31/2002 (Approximate)   SpO2 98%   BMI 38.27 kg/m   Physical Exam Vitals signs reviewed.  HENT:     Head: Normocephalic and atraumatic.     Right Ear: Tympanic membrane, ear canal and external ear normal.     Left Ear: Tympanic membrane, ear canal and external ear normal.     Nose: Nose normal. No mucosal edema.     Mouth/Throat:     Pharynx: Uvula midline. No oropharyngeal exudate or posterior oropharyngeal erythema.  Eyes:      Conjunctiva/sclera: Conjunctivae normal.     Pupils: Pupils are equal, round, and reactive to light.  Neck:     Musculoskeletal: Neck supple.     Thyroid: No thyromegaly.  Cardiovascular:  Rate and Rhythm: Normal rate and regular rhythm.     Heart sounds: Normal heart sounds.  Pulmonary:     Effort: Pulmonary effort is normal. No respiratory distress.     Breath sounds: Normal breath sounds. No wheezing or rales.  Abdominal:     General: Bowel sounds are normal. There is no distension.     Palpations: Abdomen is soft. There is no mass.     Tenderness: There is no abdominal tenderness. There is no guarding or rebound.  Lymphadenopathy:     Cervical: No cervical adenopathy.  Skin:    General: Skin is warm and dry.     Findings: No rash.  Neurological:     Mental Status: She is alert and oriented to person, place, and time.     Cranial Nerves: No cranial nerve deficit.    Assessment/Plan: 1. Visit for preventive health examination Depression screen negative. Health Maintenance reviewed -- UTD colorectal cancer screening, mammogram, PAP and Tetanus. Flu shot updated today.. Preventive schedule discussed and handout given in AVS. Will obtain fasting labs today. - CBC with Differential/Platelet - Comprehensive metabolic panel - Hemoglobin A1c - Lipid panel  2. Bipolar 2 disorder ([facility]) Stable. Continue management per Cardiology.   3. Hypothyroidism, unspecified type Taking Synthroid as directed. Repeat TSH today. - TSH  4. Other ulcerative colitis without complication ([facility]) Stable. Remission. Continue management per specialist.    Leeanne Rio, PA-C

## 2018-12-05 NOTE — Patient Instructions (Signed)
Please go to the lab for blood work.   Our office will call you with your results unless you have chosen to receive results via MyChart.  If your blood work is normal we will follow-up each year for physicals and as scheduled for chronic medical problems.  If anything is abnormal we will treat accordingly and get you in for a follow-up.   Preventive Care 40-62 Years Old, Female Preventive care refers to visits with your health care provider and lifestyle choices that can promote health and wellness. This includes:  A yearly physical exam. This may also be called an annual well check.  Regular dental visits and eye exams.  Immunizations.  Screening for certain conditions.  Healthy lifestyle choices, such as eating a healthy diet, getting regular exercise, not using drugs or products that contain nicotine and tobacco, and limiting alcohol use. What can I expect for my preventive care visit? Physical exam Your health care provider will check your:  Height and weight. This may be used to calculate body mass index (BMI), which tells if you are at a healthy weight.  Heart rate and blood pressure.  Skin for abnormal spots. Counseling Your health care provider may ask you questions about your:  Alcohol, tobacco, and drug use.  Emotional well-being.  Home and relationship well-being.  Sexual activity.  Eating habits.  Work and work environment.  Method of birth control.  Menstrual cycle.  Pregnancy history. What immunizations do I need?  Influenza (flu) vaccine  This is recommended every year. Tetanus, diphtheria, and pertussis (Tdap) vaccine  You may need a Td booster every 10 years. Varicella (chickenpox) vaccine  You may need this if you have not been vaccinated. Zoster (shingles) vaccine  You may need this after age 60. Measles, mumps, and rubella (MMR) vaccine  You may need at least one dose of MMR if you were born in 1957 or later. You may also need a  second dose. Pneumococcal conjugate (PCV13) vaccine  You may need this if you have certain conditions and were not previously vaccinated. Pneumococcal polysaccharide (PPSV23) vaccine  You may need one or two doses if you smoke cigarettes or if you have certain conditions. Meningococcal conjugate (MenACWY) vaccine  You may need this if you have certain conditions. Hepatitis A vaccine  You may need this if you have certain conditions or if you travel or work in places where you may be exposed to hepatitis A. Hepatitis B vaccine  You may need this if you have certain conditions or if you travel or work in places where you may be exposed to hepatitis B. Haemophilus influenzae type b (Hib) vaccine  You may need this if you have certain conditions. Human papillomavirus (HPV) vaccine  If recommended by your health care provider, you may need three doses over 6 months. You may receive vaccines as individual doses or as more than one vaccine together in one shot (combination vaccines). Talk with your health care provider about the risks and benefits of combination vaccines. What tests do I need? Blood tests  Lipid and cholesterol levels. These may be checked every 5 years, or more frequently if you are over 50 years old.  Hepatitis C test.  Hepatitis B test. Screening  Lung cancer screening. You may have this screening every year starting at age 55 if you have a 30-pack-year history of smoking and currently smoke or have quit within the past 15 years.  Colorectal cancer screening. All adults should have this screening starting   at age 59 and continuing until age 23. Your health care provider may recommend screening at age 34 if you are at increased risk. You will have tests every 1-10 years, depending on your results and the type of screening test.  Diabetes screening. This is done by checking your blood sugar (glucose) after you have not eaten for a while (fasting). You may have this done  every 1-3 years.  Mammogram. This may be done every 1-2 years. Talk with your health care provider about when you should start having regular mammograms. This may depend on whether you have a family history of breast cancer.  BRCA-related cancer screening. This may be done if you have a family history of breast, ovarian, tubal, or peritoneal cancers.  Pelvic exam and Pap test. This may be done every 3 years starting at age 6. Starting at age 65, this may be done every 5 years if you have a Pap test in combination with an HPV test. Other tests  Sexually transmitted disease (STD) testing.  Bone density scan. This is done to screen for osteoporosis. You may have this scan if you are at high risk for osteoporosis. Follow these instructions at home: Eating and drinking  Eat a diet that includes fresh fruits and vegetables, whole grains, lean protein, and low-fat dairy.  Take vitamin and mineral supplements as recommended by your health care provider.  Do not drink alcohol if: ? Your health care provider tells you not to drink. ? You are pregnant, may be pregnant, or are planning to become pregnant.  If you drink alcohol: ? Limit how much you have to 0-1 drink a day. ? Be aware of how much alcohol is in your drink. In the [place] one drink equals one 12 oz bottle of beer (355 mL), one 5 oz glass of wine (148 mL), or one 1 oz glass of hard liquor (44 mL). Lifestyle  Take daily care of your teeth and gums.  Stay active. Exercise for at least 30 minutes on 5 or more days each week.  Do not use any products that contain nicotine or tobacco, such as cigarettes, e-cigarettes, and chewing tobacco. If you need help quitting, ask your health care provider.  If you are sexually active, practice safe sex. Use a condom or other form of birth control (contraception) in order to prevent pregnancy and STIs (sexually transmitted infections).  If told by your health care provider, take low-dose aspirin  daily starting at age 65. What's next?  Visit your health care provider once a year for a well check visit.  Ask your health care provider how often you should have your eyes and teeth checked.  Stay up to date on all vaccines. This information is not intended to replace advice given to you by your health care provider. Make sure you discuss any questions you have with your health care provider. Document Released: 02/13/2015 Document Revised: 09/28/2017 Document Reviewed: 09/28/2017 Elsevier Patient Education  2020 [employer]. .

## 2018-12-06 LAB — COMPREHENSIVE METABOLIC PANEL
ALT: 47 U/L — ABNORMAL HIGH (ref 0–35)
AST: 47 U/L — ABNORMAL HIGH (ref 0–37)
Albumin: 4.2 g/dL (ref 3.5–5.2)
Alkaline Phosphatase: 74 U/L (ref 39–117)
BUN: 10 mg/dL (ref 6–23)
CO2: 28 mEq/L (ref 19–32)
Calcium: 9 mg/dL (ref 8.4–10.5)
Chloride: 100 mEq/L (ref 96–112)
Creatinine, Ser: 0.87 mg/dL (ref 0.40–1.20)
GFR: 65.88 mL/min (ref >60.00)
Glucose, Bld: 114 mg/dL — ABNORMAL HIGH (ref 70–99)
Potassium: 4.3 mEq/L (ref 3.5–5.1)
Sodium: 137 mEq/L (ref 135–145)
Total Bilirubin: 0.6 mg/dL (ref 0.2–1.2)
Total Protein: 6.5 g/dL (ref 6.0–8.3)

## 2018-12-06 LAB — CBC WITH DIFFERENTIAL/PLATELET
Basophils Absolute: 0.1 10*3/uL (ref 0.0–0.1)
Basophils Relative: 1.1 % (ref 0.0–3.0)
Eosinophils Absolute: 0.1 10*3/uL (ref 0.0–0.7)
Eosinophils Relative: 2.3 % (ref 0.0–5.0)
HCT: 44.2 % (ref 36.0–46.0)
Hemoglobin: 15.1 g/dL — ABNORMAL HIGH (ref 12.0–15.0)
Lymphocytes Relative: 23.5 % (ref 12.0–46.0)
Lymphs Abs: 1.2 10*3/uL (ref 0.7–4.0)
MCHC: 34.1 g/dL (ref 30.0–36.0)
MCV: 99.5 fl (ref 78.0–100.0)
Monocytes Absolute: 0.5 10*3/uL (ref 0.1–1.0)
Monocytes Relative: 9.9 % (ref 3.0–12.0)
Neutro Abs: 3.4 10*3/uL (ref 1.4–7.7)
Neutrophils Relative %: 63.2 % (ref 43.0–77.0)
Platelets: 255 10*3/uL (ref 150.0–400.0)
RBC: 4.44 Mil/uL (ref 3.87–5.11)
RDW: 13.5 % (ref 11.5–15.5)
WBC: 5.3 10*3/uL (ref 4.0–10.5)

## 2018-12-06 LAB — LIPID PANEL
Cholesterol: 274 mg/dL — ABNORMAL HIGH (ref 0–200)
HDL: 63.3 mg/dL (ref >39.00)
LDL Cholesterol: 172 mg/dL — ABNORMAL HIGH (ref 0–99)
NonHDL: 210.35
Total CHOL/HDL Ratio: 4
Triglycerides: 193 mg/dL — ABNORMAL HIGH (ref 0.0–149.0)
VLDL: 38.6 mg/dL (ref 0.0–40.0)

## 2018-12-06 LAB — TSH: TSH: 1.79 u[IU]/mL (ref 0.35–4.50)

## 2018-12-06 LAB — HEMOGLOBIN A1C: Hgb A1c MFr Bld: 5.2 % (ref 4.6–6.5)

## 2018-12-11 ENCOUNTER — Other Ambulatory Visit: Payer: Self-pay | Admitting: Emergency Medicine

## 2018-12-11 DIAGNOSIS — R7989 Other specified abnormal findings of blood chemistry: Secondary | ICD-10-CM

## 2018-12-18 DIAGNOSIS — G4733 Obstructive sleep apnea (adult) (pediatric): Secondary | ICD-10-CM | POA: Diagnosis not present

## 2018-12-25 ENCOUNTER — Other Ambulatory Visit: Payer: Self-pay

## 2018-12-25 ENCOUNTER — Ambulatory Visit (INDEPENDENT_AMBULATORY_CARE_PROVIDER_SITE_OTHER): Payer: BC Managed Care – PPO

## 2018-12-25 DIAGNOSIS — R7989 Other specified abnormal findings of blood chemistry: Secondary | ICD-10-CM

## 2018-12-25 NOTE — Addendum Note (Signed)
Addended by: Doran Clay A on: 12/25/2018 02:54 PM   Modules accepted: Orders

## 2018-12-26 ENCOUNTER — Encounter: Payer: Self-pay | Admitting: Physician Assistant

## 2018-12-26 LAB — HEPATIC FUNCTION PANEL
AG Ratio: 1.8 (calc) (ref 1.0–2.5)
ALT: 38 U/L — ABNORMAL HIGH (ref 6–29)
AST: 33 U/L (ref 10–35)
Albumin: 4.2 g/dL (ref 3.6–5.1)
Alkaline phosphatase (APISO): 58 U/L (ref 37–153)
Bilirubin, Direct: 0.1 mg/dL (ref 0.0–0.2)
Globulin: 2.3 g/dL (calc) (ref 1.9–3.7)
Indirect Bilirubin: 0.4 mg/dL (calc) (ref 0.2–1.2)
Total Bilirubin: 0.5 mg/dL (ref 0.2–1.2)
Total Protein: 6.5 g/dL (ref 6.1–8.1)

## 2019-01-07 ENCOUNTER — Telehealth: Payer: Self-pay

## 2019-01-07 NOTE — Telephone Encounter (Signed)
Orders have been signed by Ward Givens, NP and faxed to [facility]. Confirmation fax been received.

## 2019-01-09 ENCOUNTER — Ambulatory Visit: Payer: BC Managed Care – PPO | Admitting: Physician Assistant

## 2019-01-09 ENCOUNTER — Other Ambulatory Visit: Payer: Self-pay

## 2019-01-09 ENCOUNTER — Encounter: Payer: Self-pay | Admitting: Physician Assistant

## 2019-01-09 VITALS — BP 120/86 | HR 81 | Temp 97.9°F | Resp 16 | Ht 67.5 in | Wt 246.0 lb

## 2019-01-09 DIAGNOSIS — M545 Low back pain, unspecified: Secondary | ICD-10-CM

## 2019-01-09 MED ORDER — MELOXICAM 15 MG PO TABS: 15.0000 mg | ORAL_TABLET | Freq: Every day | ORAL | 0 refills | Status: DC

## 2019-01-09 MED ORDER — KETOROLAC TROMETHAMINE 60 MG/2ML IM SOLN
60.0000 mg | Freq: Once | INTRAMUSCULAR | Status: AC
  Administered 2019-01-09: 60 mg via INTRAMUSCULAR

## 2019-01-09 MED ORDER — CYCLOBENZAPRINE HCL 10 MG PO TABS: 10.0000 mg | ORAL_TABLET | Freq: Every day | ORAL | 0 refills | Status: DC

## 2019-01-09 NOTE — Progress Notes (Signed)
Patient presents to clinic today c/o 1 day of left-sided low back pain with some radiation into the left hip.  Symptoms started suddenly when she was rising from a bent position.  Noticed a popping sound in the lower back.  Notes pain is described as throbbing, but more sharp with movement. Ranks 8/10.  Endorses some spasming in her lower back bilaterally.  Has been seeing physical therapy to help with some right-sided low back pain she has had on and off.  This is been going well thus far.  For current symptoms patient has taken ibuprofen with little relief.  Denies any lower extremity numbness, tingling or weakness..  Denies saddle anesthesia.  Denies change to bowel or bladder habits.  Past Medical History:  Diagnosis Date  . Anemia, unspecified 04/18/2012  . Anxiety   . Bell's palsy   . Bipolar 2 disorder ([place])   . Depression   . History of chickenpox   . Hypercholesteremia   . Hypothyroidism 02/01/2012  . Ischemic colitis ([place]) 2010  . Menopause   . Migraine   . OSA on CPAP   . Ulcerative colitis ([place]) 02/01/2012    Current Outpatient Medications on File Prior to Visit  Medication Sig Dispense Refill  . belladonna alk-PHENObarbital (DONNATAL) 16.2 MG tablet Take 1 tablet by mouth as needed.    . Cholecalciferol (VITAMIN D PO) Take by mouth.    . clonazePAM (KLONOPIN) 0.5 MG tablet Take 1 tablet by mouth daily.    . fluticasone (FLONASE) 50 MCG/ACT nasal spray Place into both nostrils daily.    Marland Kitchen lamoTRIgine (LAMICTAL) 100 MG tablet     . LATUDA 80 MG TABS tablet Take 1 tablet by mouth daily.    Marland Kitchen loperamide (IMODIUM) 2 MG capsule Take 2 mg by mouth 4 (four) times daily as needed for diarrhea or loose stools.    Marland Kitchen loratadine (CLARITIN) 10 MG tablet Take 10 mg by mouth daily.    Marland Kitchen NEXIUM 40 MG capsule Take 40 mg by mouth daily at 12 noon.     . propranolol (INDERAL) 20 MG tablet Take 1 tablet by mouth 2 (two) times daily.     . Rhubarb (ESTROVEN MENOPAUSE RELIEF PO) Take 1 tablet by  mouth daily.    Marland Kitchen SYNTHROID 112 MCG tablet TAKE 1 TABLET DAILY 90 tablet 0  . vitamin B-12 (CYANOCOBALAMIN) 1000 MCG tablet Take 1,000 mcg by mouth daily.     No current facility-administered medications on file prior to visit.     Allergies  Allergen Reactions  . Demerol [Meperidine]     migraines  . Penicillins Hives  . Sulfa Antibiotics Hives  . Xylocaine [Lidocaine] Other (See Comments)    Doesn't numb    Family History  Problem Relation Age of Onset  . Colon cancer Father 3       Colon  . Hypertension Father   . Breast cancer Maternal Grandmother   . Heart failure Mother   . Osteoarthritis Mother   . Depression Mother   . Hypertension Mother   . Stroke Mother   . Asthma Mother   . Cancer Paternal Grandmother        skin cancer, nose  . Heart attack Paternal Grandfather   . Cancer Maternal Grandfather        lung  . Angioedema Neg Hx   . Allergic rhinitis Neg Hx   . Eczema Neg Hx   . Urticaria Neg Hx     Social History  Socioeconomic History  . Marital status: Married    Spouse name: Not on file  . Number of children: 1  . Years of education: BA  . Highest education level: Not on file  Occupational History  . Occupation: N/A  Social Needs  . Financial resource strain: Not on file  . Food insecurity    Worry: Not on file    Inability: Not on file  . Transportation needs    Medical: Not on file    Non-medical: Not on file  Tobacco Use  . Smoking status: Former Smoker    Quit date: 04/25/1991    Years since quitting: 27.7  . Smokeless tobacco: Never Used  Substance and Sexual Activity  . Alcohol use: Yes    Alcohol/week: 6.0 standard drinks    Types: 6 Standard drinks or equivalent per week    Comment: alcohol about 6 drinks a week  . Drug use: No  . Sexual activity: Yes    Partners: Male    Birth control/protection: Post-menopausal, Other-see comments    Comment: husband vasectomy  Lifestyle  . Physical activity    Days per week: Not on  file    Minutes per session: Not on file  . Stress: Not on file  Relationships  . Social Herbalist on phone: Not on file    Gets together: Not on file    Attends religious service: Not on file    Active member of club or organization: Not on file    Attends meetings of clubs or organizations: Not on file    Relationship status: Not on file  Other Topics Concern  . Not on file  Social History Narrative   2 cups of coffee a day, occasional tea     Review of Systems - See HPI.  All other ROS are negative.  Wt 246 lb (111.6 kg)   LMP 01/31/2002 (Approximate)   BMI 37.96 kg/m   Physical Exam Vitals signs reviewed.  Constitutional:      Appearance: Normal appearance.  HENT:     Head: Normocephalic and atraumatic.  Neck:     Musculoskeletal: Neck supple.  Pulmonary:     Effort: Pulmonary effort is normal.  Musculoskeletal:     Thoracic back: Normal.     Lumbar back: She exhibits tenderness (Left-sided lumbar paraspinal muscular tenderness to palpation), pain and spasm. She exhibits no bony tenderness, no swelling and no edema.  Neurological:     General: No focal deficit present.     Mental Status: She is alert and oriented to person, place, and time.     Motor: No weakness.  Psychiatric:        Mood and Affect: Mood normal.     Recent Results (from the past 2160 hour(s))  CBC with Differential/Platelet     Status: Abnormal   Collection Time: 12/05/18  2:32 PM  Result Value Ref Range   WBC 5.3 4.0 - 10.5 K/uL   RBC 4.44 3.87 - 5.11 Mil/uL   Hemoglobin 15.1 (H) 12.0 - 15.0 g/dL   HCT 44.2 36.0 - 46.0 %   MCV 99.5 78.0 - 100.0 fl   MCHC 34.1 30.0 - 36.0 g/dL   RDW 13.5 11.5 - 15.5 %   Platelets 255.0 150.0 - 400.0 K/uL   Neutrophils Relative % 63.2 43.0 - 77.0 %   Lymphocytes Relative 23.5 12.0 - 46.0 %   Monocytes Relative 9.9 3.0 - 12.0 %   Eosinophils Relative 2.3 0.0 -  5.0 %   Basophils Relative 1.1 0.0 - 3.0 %   Neutro Abs 3.4 1.4 - 7.7 K/uL    Lymphs Abs 1.2 0.7 - 4.0 K/uL   Monocytes Absolute 0.5 0.1 - 1.0 K/uL   Eosinophils Absolute 0.1 0.0 - 0.7 K/uL   Basophils Absolute 0.1 0.0 - 0.1 K/uL  Comprehensive metabolic panel     Status: Abnormal   Collection Time: 12/05/18  2:32 PM  Result Value Ref Range   Sodium 137 135 - 145 mEq/L   Potassium 4.3 3.5 - 5.1 mEq/L   Chloride 100 96 - 112 mEq/L   CO2 28 19 - 32 mEq/L   Glucose, Bld 114 (H) 70 - 99 mg/dL   BUN 10 6 - 23 mg/dL   Creatinine, Ser 0.87 0.40 - 1.20 mg/dL   Total Bilirubin 0.6 0.2 - 1.2 mg/dL   Alkaline Phosphatase 74 39 - 117 U/L   AST 47 (H) 0 - 37 U/L   ALT 47 (H) 0 - 35 U/L   Total Protein 6.5 6.0 - 8.3 g/dL   Albumin 4.2 3.5 - 5.2 g/dL   GFR 65.88 >60.00 mL/min   Calcium 9.0 8.4 - 10.5 mg/dL  Hemoglobin A1c     Status: None   Collection Time: 12/05/18  2:32 PM  Result Value Ref Range   Hgb A1c MFr Bld 5.2 4.6 - 6.5 %    Comment: Glycemic Control Guidelines for People with Diabetes:Non Diabetic:  <6%Goal of Therapy: <7%Additional Action Suggested:  >8%   Lipid panel     Status: Abnormal   Collection Time: 12/05/18  2:32 PM  Result Value Ref Range   Cholesterol 274 (H) 0 - 200 mg/dL    Comment: ATP III Classification       Desirable:  < 200 mg/dL               Borderline High:  200 - 239 mg/dL          High:  > = 240 mg/dL   Triglycerides 193.0 (H) 0.0 - 149.0 mg/dL    Comment: Normal:  <150 mg/dLBorderline High:  150 - 199 mg/dL   HDL 63.30 >39.00 mg/dL   VLDL 38.6 0.0 - 40.0 mg/dL   LDL Cholesterol 172 (H) 0 - 99 mg/dL   Total CHOL/HDL Ratio 4     Comment:                Men          Women1/2 Average Risk     3.4          3.3Average Risk          5.0          4.42X Average Risk          9.6          7.13X Average Risk          15.0          11.0                       NonHDL 210.35     Comment: NOTE:  Non-HDL goal should be 30 mg/dL higher than patient's LDL goal (i.e. LDL goal of < 70 mg/dL, would have non-HDL goal of < 100 mg/dL)  TSH     Status: None    Collection Time: 12/05/18  2:32 PM  Result Value Ref Range   TSH 1.79 0.35 - 4.50 uIU/mL  Hepatic function panel     Status: Abnormal   Collection Time: 12/25/18  2:55 PM  Result Value Ref Range   Total Protein 6.5 6.1 - 8.1 g/dL   Albumin 4.2 3.6 - 5.1 g/dL   Globulin 2.3 1.9 - 3.7 g/dL (calc)   AG Ratio 1.8 1.0 - 2.5 (calc)   Total Bilirubin 0.5 0.2 - 1.2 mg/dL   Bilirubin, Direct 0.1 0.0 - 0.2 mg/dL   Indirect Bilirubin 0.4 0.2 - 1.2 mg/dL (calc)   Alkaline phosphatase (APISO) 58 37 - 153 U/L   AST 33 10 - 35 U/L   ALT 38 (H) 6 - 29 U/L    Assessment/Plan: 1. Acute left-sided low back pain without sciatica Atraumatic.  No alarm signs or symptoms.  No bony tenderness or deformity on examination.  Tenderness is all muscular.  Some spasm noted.  Encourage patient to avoid heavy lifting and overexertion.  Start meloxicam 15 mg once daily, starting tomorrow.  Flexeril nightly.  Can take up to 3 times daily but patient instructed not to drive or operate heavy machinery if doing so.  Tylenol for breakthrough pain.  Heating pad to the area for 10 to 15 minutes at a time.  IM Toradol given today in office to help kick start her recovery.  Strict return precautions reviewed with patient.  AVS printed. - cyclobenzaprine (FLEXERIL) 10 MG tablet; Take 1 tablet (10 mg total) by mouth at bedtime.  Dispense: 15 tablet; Refill: 0 - meloxicam (MOBIC) 15 MG tablet; Take 1 tablet (15 mg total) by mouth daily.  Dispense: 15 tablet; Refill: 0   Leeanne Rio, PA-C

## 2019-01-09 NOTE — Patient Instructions (Signed)
-  Please keep hydrated. -Get plenty of rest. -Avoid heavy lifting or overexertion. -Take the meloxicam starting tomorrow, once daily as directed with food. -Tylenol for breakthrough pain -Take the Flexeril each evening.  This can be taken up to 3 times daily, but no driving or operating heavy machinery while on this medication. -The Toradol given in office today should hopefully kick start your recovery.  -Once symptoms are much improved, resume physical therapy. -If symptoms are not improving or if anything worsens, please call or come see me ASAP  -Wishing you and your husband good luck in the speedy recovery after his surgery tomorrow!

## 2019-01-17 DIAGNOSIS — G4733 Obstructive sleep apnea (adult) (pediatric): Secondary | ICD-10-CM | POA: Diagnosis not present

## 2019-02-10 ENCOUNTER — Other Ambulatory Visit: Payer: Self-pay | Admitting: Physician Assistant

## 2019-02-13 ENCOUNTER — Ambulatory Visit: Payer: Self-pay

## 2019-02-13 ENCOUNTER — Other Ambulatory Visit: Payer: Self-pay

## 2019-02-13 ENCOUNTER — Encounter: Payer: Self-pay | Admitting: Family Medicine

## 2019-02-13 ENCOUNTER — Ambulatory Visit: Payer: BC Managed Care – PPO | Admitting: Family Medicine

## 2019-02-13 VITALS — Ht 66.5 in | Wt 246.0 lb

## 2019-02-13 DIAGNOSIS — G8929 Other chronic pain: Secondary | ICD-10-CM

## 2019-02-13 DIAGNOSIS — M545 Low back pain: Secondary | ICD-10-CM

## 2019-02-13 DIAGNOSIS — F3132 Bipolar disorder, current episode depressed, moderate: Secondary | ICD-10-CM | POA: Diagnosis not present

## 2019-02-13 NOTE — Progress Notes (Signed)
Office Visit Note   Patient: Jessica Berger           Date of Birth: [birth year]           MRN: [id number] Visit Date: 02/13/2019 Requested by: Brunetta Jeans, PA-C [id number] [place],  [place] [address] PCP: Delorse Limber  Subjective: Chief Complaint  Patient presents with  . Lower Back - Pain    Chronic pain in x 30 years (occasional spasms). Has had consistent pain since August 2020 - spasms. Pain is usually on the right, but sometimes on the left. Been getting dry needling by Kym Groom.     HPI: She is here at the request of Kym Groom for low back pain.  Original onset 30 years ago.  The day before, she had been moving to a new house doing a lot of lifting.  She did not have any pain while lifting, but the next day she simply bent forward and felt sudden severe pain in her low back and she has been dealing with intermittent pain since then.  This past year it flared up again in August and she started seeing a chiropractor but was not comfortable with their Covid preparations so she stopped going.  She then began seeing Edwena Felty and has had significant improvement in her range of motion and posture, but her pain seems to have reached a plateau.  She denies any radicular symptoms, denies any weakness or numbness, denies any bowel or bladder dysfunction.  Pain is worse when walking uphill when slightly flexed forward, better when walking on flat surfaces.  She has an adjustable bed and is able to find a comfortable position at night.  She cannot lie on a flat surface.  No fevers or chills, no unintentional weight change.  She has a history of ulcerative colitis years ago but it seems to be in remission.  Other medical problems are stable.               ROS:   All other systems were reviewed and are negative.  Objective: Vital Signs: Ht 5' 6.5" (1.689 m)   Wt 246 lb (111.6 kg)   LMP 01/31/2002 (Approximate)   BMI 39.11 kg/m   Physical Exam:    General:  Alert and oriented, in no acute distress. Pulm:  Breathing unlabored. Psy:  Normal mood, congruent affect. Skin: No visible rash. Low back: No scoliosis.  She has no tenderness in the upper lumbar spine.  There is moderate tenderness in the midline over the L5-S1 level and near the right greater than left SI joint.  Negative straight leg raise, lower extremity strength is 5/5 throughout.  She has hypoactive knee and ankle DTRs bilaterally.  Imaging: X-rays lumbar spine: She has moderate degenerative disc disease at T11-12 and L3-4.  She has moderate facet degenerative change at the L4-5 and L5-S1 levels.  Mild to moderate bilateral SI joint DJD.  Well-preserved hip joints.  No sign of compression fracture or neoplasm.   Assessment & Plan: 1.  Chronic intermittent low back pain, etiology uncertain but could be a lumbar disc protrusion or possibly facet syndrome or sacroiliac dysfunction. -She has been through adequate conservative treatment and has reached a plateau.  We will order MRI scan to further evaluate.  Depending on the results, she is interested in possible referral to Dr. Ernestina Patches for injections.  She did not want any new medications today.     Procedures: No procedures performed  No notes on file     PMFS History: Patient Active Problem List   Diagnosis Date Noted  . Seasonal and perennial allergic rhinitis 05/18/2017  . Easy bruising 07/18/2016  . Hypothyroidism 07/15/2016  . Bipolar 2 disorder ([place])   . Ischemic colitis ([place])   . Anemia, unspecified 04/18/2012  . Ulcerative colitis ([place]) 02/01/2012   Past Medical History:  Diagnosis Date  . Anemia, unspecified 04/18/2012  . Anxiety   . Bell's palsy   . Bipolar 2 disorder ([place])   . Depression   . History of chickenpox   . Hypercholesteremia   . Hypothyroidism 02/01/2012  . Ischemic colitis ([place]) 2010  . Menopause   . Migraine   . OSA on CPAP   . Ulcerative colitis ([place]) 02/01/2012    Family History   Problem Relation Age of Onset  . Colon cancer Father 71       Colon  . Hypertension Father   . Breast cancer Maternal Grandmother   . Heart failure Mother   . Osteoarthritis Mother   . Depression Mother   . Hypertension Mother   . Stroke Mother   . Asthma Mother   . Cancer Paternal Grandmother        skin cancer, nose  . Heart attack Paternal Grandfather   . Cancer Maternal Grandfather        lung  . Angioedema Neg Hx   . Allergic rhinitis Neg Hx   . Eczema Neg Hx   . Urticaria Neg Hx     Past Surgical History:  Procedure Laterality Date  . CATARACT EXTRACTION W/ INTRAOCULAR LENS IMPLANT Left 03/14/2016  . CATARACT EXTRACTION W/ INTRAOCULAR LENS IMPLANT Right 04/18/2016  . NASAL SEPTUM SURGERY     Social History   Occupational History  . Occupation: N/A  Tobacco Use  . Smoking status: Former Smoker    Quit date: 04/25/1991    Years since quitting: 27.8  . Smokeless tobacco: Never Used  Substance and Sexual Activity  . Alcohol use: Yes    Alcohol/week: 6.0 standard drinks    Types: 6 Standard drinks or equivalent per week    Comment: alcohol about 6 drinks a week  . Drug use: No  . Sexual activity: Yes    Partners: Male    Birth control/protection: Post-menopausal, Other-see comments    Comment: husband vasectomy

## 2019-02-17 DIAGNOSIS — G4733 Obstructive sleep apnea (adult) (pediatric): Secondary | ICD-10-CM | POA: Diagnosis not present

## 2019-02-18 ENCOUNTER — Encounter: Payer: Self-pay | Admitting: Family Medicine

## 2019-02-22 ENCOUNTER — Encounter: Payer: Self-pay | Admitting: Family Medicine

## 2019-02-22 ENCOUNTER — Encounter: Payer: Self-pay | Admitting: Physician Assistant

## 2019-02-22 MED ORDER — DIAZEPAM 5 MG PO TABS: ORAL_TABLET | ORAL | 0 refills | Status: DC

## 2019-02-23 ENCOUNTER — Encounter: Payer: Self-pay | Admitting: Family Medicine

## 2019-02-23 DIAGNOSIS — M545 Low back pain: Secondary | ICD-10-CM | POA: Diagnosis not present

## 2019-02-25 ENCOUNTER — Telehealth: Payer: Self-pay | Admitting: Family Medicine

## 2019-02-25 NOTE — Telephone Encounter (Signed)
MRI shows arthritis in the facet joints at the L5-S1 level.  No disc herniations, no nerve impingement seen.    I think you may benefit from referral to Dr. Ernestina Patches for facet joint injections.

## 2019-03-05 ENCOUNTER — Encounter: Payer: Self-pay | Admitting: Family Medicine

## 2019-03-05 DIAGNOSIS — G8929 Other chronic pain: Secondary | ICD-10-CM

## 2019-03-13 ENCOUNTER — Encounter: Payer: Self-pay | Admitting: Physical Medicine and Rehabilitation

## 2019-03-18 ENCOUNTER — Ambulatory Visit: Payer: Self-pay

## 2019-03-18 ENCOUNTER — Other Ambulatory Visit: Payer: Self-pay

## 2019-03-18 ENCOUNTER — Encounter: Payer: Self-pay | Admitting: Physical Medicine and Rehabilitation

## 2019-03-18 ENCOUNTER — Ambulatory Visit: Payer: BC Managed Care – PPO | Admitting: Physical Medicine and Rehabilitation

## 2019-03-18 VITALS — BP 123/72 | HR 66

## 2019-03-18 DIAGNOSIS — M47816 Spondylosis without myelopathy or radiculopathy, lumbar region: Secondary | ICD-10-CM | POA: Diagnosis not present

## 2019-03-18 MED ORDER — METHYLPREDNISOLONE ACETATE 80 MG/ML IJ SUSP
40.0000 mg | Freq: Once | INTRAMUSCULAR | Status: AC
  Administered 2019-03-18: 15:00:00 40 mg

## 2019-03-18 NOTE — Progress Notes (Signed)
   Numeric Pain Rating Scale and Functional Assessment Average Pain (6)   In the last MONTH (on 0-10 scale) has pain interfered with the following?  1. General activity like being  able to carry out your everyday physical activities such as walking, climbing stairs, carrying groceries, or moving a chair?  Rating(9)   +Driver, -BT, -Dye Allergies.

## 2019-03-19 NOTE — Procedures (Signed)
Lumbar Facet Joint Intra-Articular Injection(s) with Fluoroscopic Guidance  Patient: Jessica Berger      Date of Birth: 07-26-1955 MRN: [id number] PCP: Brunetta Jeans, PA-C      Visit Date: 03/18/2019   Universal Protocol:    Date/Time: 03/18/2019  Consent Given By: the patient  Position: PRONE   Additional Comments: Vital signs were monitored before and after the procedure. Patient was prepped and draped in the usual sterile fashion. The correct patient, procedure, and site was verified.   Injection Procedure Details:  Procedure Site One Meds Administered:  Meds ordered this encounter  Medications  . methylPREDNISolone acetate (DEPO-MEDROL) injection 40 mg     Laterality: Bilateral  Location/Site:  L5-S1  Needle size: 22 guage  Needle type: Spinal  Needle Placement: Articular  Findings:  -Comments: Excellent flow of contrast producing a partial arthrogram.  Procedure Details: The fluoroscope beam is vertically oriented in AP, and the inferior recess is visualized beneath the lower pole of the inferior apophyseal process, which represents the target point for needle insertion. When direct visualization is difficult the target point is located at the medial projection of the vertebral pedicle. The region overlying each aforementioned target is locally anesthetized with a 1 to 2 ml. volume of 1% Lidocaine without Epinephrine.   The spinal needle was inserted into each of the above mentioned facet joints using biplanar fluoroscopic guidance. A 0.25 to 0.5 ml. volume of Isovue-250 was injected and a partial facet joint arthrogram was obtained. A single spot film was obtained of the resulting arthrogram.    One to 1.25 ml of the steroid/anesthetic solution was then injected into each of the facet joints noted above.   Additional Comments:  The patient tolerated the procedure well Dressing: 2 x 2 sterile gauze and Band-Aid    Post-procedure details: Patient was  observed during the procedure. Post-procedure instructions were reviewed.  Patient left the clinic in stable condition.

## 2019-03-19 NOTE — Progress Notes (Signed)
Jessica Berger - 64 y.o. female MRN [id number]  Date of birth: March 24, 1955  Office Visit Note: Visit Date: 03/18/2019 PCP: Brunetta Jeans, PA-C Referred by: Brunetta Jeans, PA-C  Subjective: Chief Complaint  Patient presents with  . Lower Back - Pain   HPI: Jessica Berger is a 64 y.o. female who comes in today For planned bilateral L5-S1 facet joint blocks at the request of Dr. Eunice Blase.  MRI reviewed below and reviewed with the patient with spine model.  The patient has failed conservative care including home exercise, medications, time and activity modification.  This injection will be diagnostic and hopefully therapeutic.  Please see requesting physician notes for further details and justification. She reports worsening axial back pain with some referral into the left lateral buttock and hip but no paresthesia nothing down the legs.  ROS Otherwise per HPI.  Assessment & Plan: Visit Diagnoses:  1. Spondylosis without myelopathy or radiculopathy, lumbar region     Plan: No additional findings.   Meds & Orders:  Meds ordered this encounter  Medications  . methylPREDNISolone acetate (DEPO-MEDROL) injection 40 mg    Orders Placed This Encounter  Procedures  . XR C-ARM NO REPORT  . Epidural Steroid injection    Follow-up: Return if symptoms worsen or fail to improve.   Procedures: No procedures performed  Lumbar Facet Joint Intra-Articular Injection(s) with Fluoroscopic Guidance  Patient: Jessica Berger      Date of Birth: 03/05/1955 MRN: [id number] PCP: Brunetta Jeans, PA-C      Visit Date: 03/18/2019   Universal Protocol:    Date/Time: 03/18/2019  Consent Given By: the patient  Position: PRONE   Additional Comments: Vital signs were monitored before and after the procedure. Patient was prepped and draped in the usual sterile fashion. The correct patient, procedure, and site was verified.   Injection Procedure Details:  Procedure Site One Meds  Administered:  Meds ordered this encounter  Medications  . methylPREDNISolone acetate (DEPO-MEDROL) injection 40 mg     Laterality: Bilateral  Location/Site:  L5-S1  Needle size: 22 guage  Needle type: Spinal  Needle Placement: Articular  Findings:  -Comments: Excellent flow of contrast producing a partial arthrogram.  Procedure Details: The fluoroscope beam is vertically oriented in AP, and the inferior recess is visualized beneath the lower pole of the inferior apophyseal process, which represents the target point for needle insertion. When direct visualization is difficult the target point is located at the medial projection of the vertebral pedicle. The region overlying each aforementioned target is locally anesthetized with a 1 to 2 ml. volume of 1% Lidocaine without Epinephrine.   The spinal needle was inserted into each of the above mentioned facet joints using biplanar fluoroscopic guidance. A 0.25 to 0.5 ml. volume of Isovue-250 was injected and a partial facet joint arthrogram was obtained. A single spot film was obtained of the resulting arthrogram.    One to 1.25 ml of the steroid/anesthetic solution was then injected into each of the facet joints noted above.   Additional Comments:  The patient tolerated the procedure well Dressing: 2 x 2 sterile gauze and Band-Aid    Post-procedure details: Patient was observed during the procedure. Post-procedure instructions were reviewed.  Patient left the clinic in stable condition.     Clinical History: MRI LUMBAR SPINE WITHOUT CONTRAST  TECHNIQUE: Multiplanar, multisequence MR imaging of the lumbar spine was performed. No intravenous contrast was administered.  COMPARISON: Plain films lumbar spine 02/13/2019.  FINDINGS: Segmentation: Standard.  Alignment: Maintained.  Vertebrae: No fracture, evidence of discitis, or bone lesion.  Conus medullaris and cauda equina: Conus extends to the L1 level. Conus and  cauda equina appear normal.  Paraspinal and other soft tissues: Negative.  Disc levels:  The T11-12 and T12-L1 are imaged in the sagittal plane only. Minimal disc bulging is present at these levels without stenosis.  L1-2: Minimal disc bulge without stenosis.  L2-3: Negative.  L3-4: There is loss of disc space height with a very shallow bulge. No stenosis.  L4-5: Negative.  L5-S1: Moderate facet arthropathy is worse on the right. No disc bulge or protrusion. No stenosis.  IMPRESSION: Mild lumbar degenerative disease without central canal or foraminal stenosis. Moderate bilateral facet arthropathy at L5-S1 is worse on the right.   Electronically Signed  By: Inge Rise M.D.  On: 02/23/2019 17:31   She reports that she quit smoking about 27 years ago. She has never used smokeless tobacco.  Recent Labs    12/05/18 1432  HGBA1C 5.2    Objective:  VS:  HT:    WT:   BMI:     BP:123/72  HR:66bpm  TEMP: ( )  RESP:  Physical Exam  Ortho Exam Imaging: XR C-ARM NO REPORT  Result Date: 03/18/2019 Please see Notes tab for imaging impression.   Past Medical/Family/Surgical/Social History: Medications & Allergies reviewed per EMR, new medications updated. Patient Active Problem List   Diagnosis Date Noted  . Seasonal and perennial allergic rhinitis 05/18/2017  . Easy bruising 07/18/2016  . Hypothyroidism 07/15/2016  . Bipolar 2 disorder ([place])   . Ischemic colitis ([place])   . Anemia, unspecified 04/18/2012  . Ulcerative colitis ([place]) 02/01/2012   Past Medical History:  Diagnosis Date  . Anemia, unspecified 04/18/2012  . Anxiety   . Bell's palsy   . Bipolar 2 disorder ([place])   . Depression   . History of chickenpox   . Hypercholesteremia   . Hypothyroidism 02/01/2012  . Ischemic colitis ([place]) 2010  . Menopause   . Migraine   . OSA on CPAP   . Ulcerative colitis ([place]) 02/01/2012   Family History  Problem Relation Age of Onset  . Colon cancer Father 25        Colon  . Hypertension Father   . Breast cancer Maternal Grandmother   . Heart failure Mother   . Osteoarthritis Mother   . Depression Mother   . Hypertension Mother   . Stroke Mother   . Asthma Mother   . Cancer Paternal Grandmother        skin cancer, nose  . Heart attack Paternal Grandfather   . Cancer Maternal Grandfather        lung  . Angioedema Neg Hx   . Allergic rhinitis Neg Hx   . Eczema Neg Hx   . Urticaria Neg Hx    Past Surgical History:  Procedure Laterality Date  . CATARACT EXTRACTION W/ INTRAOCULAR LENS IMPLANT Left 03/14/2016  . CATARACT EXTRACTION W/ INTRAOCULAR LENS IMPLANT Right 04/18/2016  . NASAL SEPTUM SURGERY     Social History   Occupational History  . Occupation: N/A  Tobacco Use  . Smoking status: Former Smoker    Quit date: 04/25/1991    Years since quitting: 27.9  . Smokeless tobacco: Never Used  Substance and Sexual Activity  . Alcohol use: Yes    Alcohol/week: 6.0 standard drinks    Types: 6 Standard drinks or equivalent per week    Comment:  alcohol about 6 drinks a week  . Drug use: No  . Sexual activity: Yes    Partners: Male    Birth control/protection: Post-menopausal, Other-see comments    Comment: husband vasectomy

## 2019-03-20 ENCOUNTER — Encounter: Payer: Self-pay | Admitting: Physical Medicine and Rehabilitation

## 2019-03-20 DIAGNOSIS — G4733 Obstructive sleep apnea (adult) (pediatric): Secondary | ICD-10-CM | POA: Diagnosis not present

## 2019-03-28 ENCOUNTER — Other Ambulatory Visit: Payer: Self-pay | Admitting: *Deleted

## 2019-03-28 DIAGNOSIS — R7989 Other specified abnormal findings of blood chemistry: Secondary | ICD-10-CM

## 2019-03-29 ENCOUNTER — Other Ambulatory Visit: Payer: Self-pay | Admitting: Certified Nurse Midwife

## 2019-03-29 ENCOUNTER — Other Ambulatory Visit: Payer: Self-pay

## 2019-03-29 ENCOUNTER — Ambulatory Visit (INDEPENDENT_AMBULATORY_CARE_PROVIDER_SITE_OTHER): Payer: BC Managed Care – PPO

## 2019-03-29 DIAGNOSIS — R7989 Other specified abnormal findings of blood chemistry: Secondary | ICD-10-CM | POA: Diagnosis not present

## 2019-03-29 DIAGNOSIS — Z1231 Encounter for screening mammogram for malignant neoplasm of breast: Secondary | ICD-10-CM

## 2019-03-29 LAB — HEPATIC FUNCTION PANEL
ALT: 11 U/L (ref 0–35)
AST: 12 U/L (ref 0–37)
Albumin: 4.3 g/dL (ref 3.5–5.2)
Alkaline Phosphatase: 63 U/L (ref 39–117)
Bilirubin, Direct: 0.1 mg/dL (ref 0.0–0.3)
Total Bilirubin: 0.5 mg/dL (ref 0.2–1.2)
Total Protein: 6.8 g/dL (ref 6.0–8.3)

## 2019-04-03 ENCOUNTER — Other Ambulatory Visit: Payer: Self-pay

## 2019-04-03 ENCOUNTER — Ambulatory Visit
Admission: RE | Admit: 2019-04-03 | Discharge: 2019-04-03 | Disposition: A | Discharge status: Home / Self Care | Payer: BC Managed Care – PPO | Source: Ambulatory Visit | Attending: Certified Nurse Midwife | Admitting: Certified Nurse Midwife

## 2019-04-03 DIAGNOSIS — Z1231 Encounter for screening mammogram for malignant neoplasm of breast: Secondary | ICD-10-CM

## 2019-04-04 NOTE — Progress Notes (Signed)
Mammogram is negative with B density. Repeat yearly

## 2019-04-17 DIAGNOSIS — G4733 Obstructive sleep apnea (adult) (pediatric): Secondary | ICD-10-CM | POA: Diagnosis not present

## 2019-04-23 ENCOUNTER — Encounter: Payer: Self-pay | Admitting: Certified Nurse Midwife

## 2019-05-18 DIAGNOSIS — G4733 Obstructive sleep apnea (adult) (pediatric): Secondary | ICD-10-CM | POA: Diagnosis not present

## 2019-06-17 DIAGNOSIS — G4733 Obstructive sleep apnea (adult) (pediatric): Secondary | ICD-10-CM | POA: Diagnosis not present

## 2019-06-24 ENCOUNTER — Encounter: Payer: Self-pay | Admitting: Physician Assistant

## 2019-06-25 DIAGNOSIS — K760 Fatty (change of) liver, not elsewhere classified: Secondary | ICD-10-CM | POA: Diagnosis not present

## 2019-06-25 DIAGNOSIS — K219 Gastro-esophageal reflux disease without esophagitis: Secondary | ICD-10-CM | POA: Diagnosis not present

## 2019-06-25 DIAGNOSIS — K449 Diaphragmatic hernia without obstruction or gangrene: Secondary | ICD-10-CM | POA: Diagnosis not present

## 2019-06-25 NOTE — Telephone Encounter (Signed)
>   5 months since acute visit for this. Would at least need video visit for ongoing Rx.

## 2019-06-28 ENCOUNTER — Telehealth: Payer: Self-pay | Admitting: Physician Assistant

## 2019-06-28 NOTE — Telephone Encounter (Signed)
Received triage note regarding back pain. No alarm signs/symptoms. Flare of her chronic back issues. I have sent in a refill of the flexeril (muscle relaxant) to help calm things down since we are full here today. Can be seen at other office today if she chooses. Otherwise take medication as directed, avoid heavy lifting and will see her first thing Tuesday. Urgent Care this weekend for any acutely worsening symptoms.

## 2019-06-29 ENCOUNTER — Other Ambulatory Visit: Payer: Self-pay | Admitting: Physician Assistant

## 2019-06-29 DIAGNOSIS — M545 Low back pain, unspecified: Secondary | ICD-10-CM

## 2019-07-02 ENCOUNTER — Telehealth (INDEPENDENT_AMBULATORY_CARE_PROVIDER_SITE_OTHER): Payer: BC Managed Care – PPO | Admitting: Physician Assistant

## 2019-07-02 ENCOUNTER — Other Ambulatory Visit: Payer: Self-pay

## 2019-07-02 DIAGNOSIS — M545 Low back pain, unspecified: Secondary | ICD-10-CM

## 2019-07-02 MED ORDER — CYCLOBENZAPRINE HCL 10 MG PO TABS: 10.0000 mg | ORAL_TABLET | Freq: Every day | ORAL | 0 refills | Status: DC

## 2019-07-02 NOTE — Progress Notes (Signed)
Virtual Visit via Video   I connected with patient on 07/02/19 at 10:00 AM EDT by a video enabled telemedicine application and verified that I am speaking with the correct person using two identifiers.  Location patient: Home Location provider: [facility], Office Persons participating in the virtual visit: Patient, Provider, [place] (Patina Moore)  I discussed the limitations of evaluation and management by telemedicine and the availability of in person appointments. The patient expressed understanding and agreed to proceed.  Subjective:   HPI:   Patient presents via [facility] today following up regarding flare up of low back pain. Patient messaged at end of day last Friday requesting refill of her Flexeril until she could be seen. Notes 2-3 weeks of a flare of her intermittent low back pain. Is solely r-sided. Denies any trauma or injury. Notes symptoms starting after a > 10 hour drive from [place]. Denies radiation into lower back. Denies saddle anesthesia, numbness/tingling of lower extremities or change to bowel/bladder habits. Notes she did not receive the Flexeril from the pharmacy. Has been using Ibuprofen with only slight relief. Has also applied a heating pad to the area.  Notes symptoms improved from last week but still present. Pain about 4/10.   ROS:   See pertinent positives and negatives per HPI.  Patient Active Problem List   Diagnosis Date Noted  . Seasonal and perennial allergic rhinitis 05/18/2017  . Easy bruising 07/18/2016  . Hypothyroidism 07/15/2016  . Bipolar 2 disorder ([place])   . Ischemic colitis ([place])   . Anemia, unspecified 04/18/2012  . Ulcerative colitis ([place]) 02/01/2012    Social History   Tobacco Use  . Smoking status: Former Smoker    Quit date: 04/25/1991    Years since quitting: 28.2  . Smokeless tobacco: Never Used  Substance Use Topics  . Alcohol use: Yes    Alcohol/week: 6.0 standard drinks    Types: 6 Standard drinks or equivalent  per week    Comment: alcohol about 6 drinks a week    Current Outpatient Medications:  .  APPLE CIDER VINEGAR PO, Take by mouth daily in the afternoon., Disp: , Rfl:  .  Cholecalciferol (VITAMIN D PO), Take by mouth., Disp: , Rfl:  .  clonazePAM (KLONOPIN) 0.5 MG tablet, Take 1 tablet by mouth daily., Disp: , Rfl:  .  cyclobenzaprine (FLEXERIL) 10 MG tablet, Take 1 tablet (10 mg total) by mouth at bedtime., Disp: 15 tablet, Rfl: 0 .  diazepam (VALIUM) 5 MG tablet, 1 PO 1 hour before MRI, repeat prn, Disp: 5 tablet, Rfl: 0 .  fexofenadine (ALLEGRA) 180 MG tablet, Take 180 mg by mouth daily., Disp: , Rfl:  .  fluticasone (FLONASE) 50 MCG/ACT nasal spray, Place into both nostrils daily., Disp: , Rfl:  .  lamoTRIgine (LAMICTAL) 100 MG tablet, , Disp: , Rfl:  .  LATUDA 80 MG TABS tablet, Take 1 tablet by mouth daily., Disp: , Rfl:  .  levothyroxine (SYNTHROID) 112 MCG tablet, TAKE 1 TABLET DAILY, Disp: 90 tablet, Rfl: 2 .  loperamide (IMODIUM) 2 MG capsule, Take 2 mg by mouth 4 (four) times daily as needed for diarrhea or loose stools., Disp: , Rfl:  .  meloxicam (MOBIC) 15 MG tablet, Take 1 tablet (15 mg total) by mouth daily., Disp: 15 tablet, Rfl: 0 .  NEXIUM 40 MG capsule, Take 40 mg by mouth daily at 12 noon. , Disp: , Rfl:  .  Omega 3 1200 MG CAPS, Take by mouth 2 (two) times  daily., Disp: , Rfl:  .  propranolol (INDERAL) 20 MG tablet, Take 1 tablet by mouth 2 (two) times daily. , Disp: , Rfl:  .  Rhubarb (ESTROVEN MENOPAUSE RELIEF PO), Take 1 tablet by mouth daily., Disp: , Rfl:  .  TURMERIC CURCUMIN PO, Take by mouth daily., Disp: , Rfl:  .  vitamin B-12 (CYANOCOBALAMIN) 1000 MCG tablet, Take 1,000 mcg by mouth daily., Disp: , Rfl:   Allergies  Allergen Reactions  . Demerol [Meperidine]     migraines  . Penicillins Hives  . Sulfa Antibiotics Hives  . Xylocaine [Lidocaine] Other (See Comments)    Doesn't numb    Objective:   LMP 01/31/2002 (Approximate)   Patient is  well-developed, well-nourished in no acute distress.  Resting comfortably at home.  Head is normocephalic, atraumatic.  No labored breathing.  Speech is clear and coherent with logical content.  Patient is alert and oriented at baseline.   Assessment and Plan:   1. Acute right-sided low back pain without sciatica Atraumatic. No alarm signs or symptoms. Alternate tylenol and Ibuprofen or aleve. Continue heating pad. Flexeril resubmitted to the pharmacy. Called and verified receipt. Supportive measures reviewed. Follow-up in office if not continuing to resolve.  - cyclobenzaprine (FLEXERIL) 10 MG tablet; Take 1 tablet (10 mg total) by mouth at bedtime.  Dispense: 30 tablet; Refill: 0    Leeanne Rio, [place] 07/02/2019

## 2019-07-09 ENCOUNTER — Encounter: Payer: Self-pay | Admitting: Physical Medicine and Rehabilitation

## 2019-07-15 ENCOUNTER — Encounter: Payer: Self-pay | Admitting: Adult Health

## 2019-07-16 DIAGNOSIS — H26493 Other secondary cataract, bilateral: Secondary | ICD-10-CM | POA: Diagnosis not present

## 2019-07-17 ENCOUNTER — Encounter: Payer: Self-pay | Admitting: Adult Health

## 2019-07-17 ENCOUNTER — Ambulatory Visit: Payer: BC Managed Care – PPO | Admitting: Adult Health

## 2019-07-17 ENCOUNTER — Other Ambulatory Visit: Payer: Self-pay

## 2019-07-17 VITALS — BP 159/92 | HR 53 | Ht 67.0 in | Wt 248.0 lb

## 2019-07-17 DIAGNOSIS — Z9989 Dependence on other enabling machines and devices: Secondary | ICD-10-CM

## 2019-07-17 DIAGNOSIS — G4733 Obstructive sleep apnea (adult) (pediatric): Secondary | ICD-10-CM | POA: Diagnosis not present

## 2019-07-17 NOTE — Progress Notes (Addendum)
PATIENT: Jessica Berger DOB: 1955/06/23  REASON FOR VISIT: follow up HISTORY FROM: patient  HISTORY OF PRESENT ILLNESS: Today 07/17/19:  Jessica Berger is a 64 year old female with a history of obstructive sleep apnea on CPAP.  Her download indicates that she used her machine nightly for compliance of 100%.  She used her machine greater than 4 hours each night.  On average she uses her machine 10 hours and 20 minutes.  Her residual AHI is 0.5 on 8 cm of water with EPR 1.  Leak in the 95th percentile is 2.7 L/min.  She reports that the CPAP continues to work well for her.  She returns today for an evaluation.  HISTORY 07/17/18: Jessica Berger is a 64 year old female with a history of obstructive sleep apnea on CPAP.  Her download indicates that she used her machine 29 out of 30 days for compliance of 97%.  She use her machine greater than 4 hours each night.  On average she uses her machine 10 hours and 34 minutes.  Her residual AHI is 0.4 on 8 cm of water with an EPR 3.  Her leak in the 95th percentile is 0.7 L/min.  Overall she is doing well.  States that she does not want to sleep without the machine.  She denies any new issues.   REVIEW OF SYSTEMS: Out of a complete 14 system review of symptoms, the patient complains only of the following symptoms, and all other reviewed systems are negative.   ESS 6  ALLERGIES: Allergies  Allergen Reactions   Demerol [Meperidine]     migraines   Penicillins Hives   Sulfa Antibiotics Hives   Xylocaine [Lidocaine] Other (See Comments)    Doesn't numb    HOME MEDICATIONS: Outpatient Medications Prior to Visit  Medication Sig Dispense Refill   APPLE CIDER VINEGAR PO Take by mouth daily in the afternoon.     Cholecalciferol (VITAMIN D PO) Take by mouth.     clonazePAM (KLONOPIN) 0.5 MG tablet Take 1 tablet by mouth daily.     diazepam (VALIUM) 5 MG tablet 1 PO 1 hour before MRI, repeat prn 5 tablet 0   fexofenadine (ALLEGRA) 180 MG  tablet Take 180 mg by mouth daily.     fluticasone (FLONASE) 50 MCG/ACT nasal spray Place into both nostrils daily.     lamoTRIgine (LAMICTAL) 100 MG tablet      LATUDA 80 MG TABS tablet Take 1 tablet by mouth daily.     levothyroxine (SYNTHROID) 112 MCG tablet TAKE 1 TABLET DAILY 90 tablet 2   loperamide (IMODIUM) 2 MG capsule Take 2 mg by mouth 4 (four) times daily as needed for diarrhea or loose stools.     NEXIUM 40 MG capsule Take 40 mg by mouth daily at 12 noon.      propranolol (INDERAL) 20 MG tablet Take 1 tablet by mouth 2 (two) times daily.      Rhubarb (ESTROVEN MENOPAUSE RELIEF PO) Take 1 tablet by mouth daily.     TURMERIC CURCUMIN PO Take by mouth daily.     vitamin B-12 (CYANOCOBALAMIN) 1000 MCG tablet Take 1,000 mcg by mouth daily.     cyclobenzaprine (FLEXERIL) 10 MG tablet Take 1 tablet (10 mg total) by mouth at bedtime. 30 tablet 0   meloxicam (MOBIC) 15 MG tablet Take 1 tablet (15 mg total) by mouth daily. 15 tablet 0   Omega 3 1200 MG CAPS Take by mouth 2 (two) times daily.  No facility-administered medications prior to visit.    PAST MEDICAL HISTORY: Past Medical History:  Diagnosis Date   Anemia, unspecified 04/18/2012   Anxiety    Bell's palsy    Bipolar 2 disorder ([place])    Depression    History of chickenpox    Hypercholesteremia    Hypothyroidism 02/01/2012   Ischemic colitis ([place]) 2010   Menopause    Migraine    OSA on CPAP    Ulcerative colitis ([place]) 02/01/2012    PAST SURGICAL HISTORY: Past Surgical History:  Procedure Laterality Date   CATARACT EXTRACTION W/ INTRAOCULAR LENS IMPLANT Left 03/14/2016   CATARACT EXTRACTION W/ INTRAOCULAR LENS IMPLANT Right 04/18/2016   NASAL SEPTUM SURGERY      FAMILY HISTORY: Family History  Problem Relation Age of Onset   Colon cancer Father 86       Colon   Hypertension Father    Breast cancer Maternal Grandmother    Heart failure Mother    Osteoarthritis Mother     Depression Mother    Hypertension Mother    Stroke Mother    Asthma Mother    Cancer Paternal Grandmother        skin cancer, nose   Heart attack Paternal Grandfather    Cancer Maternal Grandfather        lung   Angioedema Neg Hx    Allergic rhinitis Neg Hx    Eczema Neg Hx    Urticaria Neg Hx     SOCIAL HISTORY: Social History   Socioeconomic History   Marital status: Married    Spouse name: Not on file   Number of children: 1   Years of education: BA   Highest education level: Not on file  Occupational History   Occupation: N/A  Tobacco Use   Smoking status: Former Smoker    Quit date: 04/25/1991    Years since quitting: 28.2   Smokeless tobacco: Never Used  Vaping Use   Vaping Use: Never used  Substance and Sexual Activity   Alcohol use: Yes    Alcohol/week: 6.0 standard drinks    Types: 6 Standard drinks or equivalent per week    Comment: alcohol about 6 drinks a week   Drug use: No   Sexual activity: Yes    Partners: Male    Birth control/protection: Post-menopausal, Other-see comments    Comment: husband vasectomy  Other Topics Concern   Not on file  Social History Narrative   2 cups of coffee a day, occasional tea    Social Determinants of Radio broadcast assistant Strain:    Difficulty of Paying Living Expenses:   Food Insecurity:    Worried About Charity fundraiser in the Last Year:    Arboriculturist in the Last Year:   Transportation Needs:    Film/video editor (Medical):    Lack of Transportation (Non-Medical):   Physical Activity:    Days of Exercise per Week:    Minutes of Exercise per Session:   Stress:    Feeling of Stress :   Social Connections:    Frequency of Communication with Friends and Family:    Frequency of Social Gatherings with Friends and Family:    Attends Religious Services:    Active Member of Clubs or Organizations:    Attends Archivist Meetings:    Marital  Status:   Intimate Partner Violence:    Fear of Current or Ex-Partner:    Emotionally Abused:  Physically Abused:    Sexually Abused:       PHYSICAL EXAM  Vitals:   07/17/19 1341 07/17/19 1345  BP: (!) 172/89 (!) 159/92  Pulse: (!) 59 (!) 53  Weight: 248 lb (112.5 kg)   Height: 5' 7"  (1.702 m)    Body mass index is 38.84 kg/m.  Generalized: Well developed, in no acute distress  Chest: Lungs clear to auscultation bilaterally  Neurological examination  Mentation: Alert oriented to time, place, history taking. Follows all commands speech and language fluent Cranial nerve II-XII: Extraocular movements were full, visual field were full on confrontational test Head turning and shoulder shrug  were normal and symmetric. Motor: The motor testing reveals 5 over 5 strength of all 4 extremities. Good symmetric motor tone is noted throughout.  Sensory: Sensory testing is intact to soft touch on all 4 extremities. No evidence of extinction is noted.  Gait and station: Gait is normal.    DIAGNOSTIC DATA (LABS, IMAGING, TESTING) - I reviewed patient records, labs, notes, testing and imaging myself where available.  Lab Results  Component Value Date   WBC 5.3 12/05/2018   HGB 15.1 (H) 12/05/2018   HCT 44.2 12/05/2018   MCV 99.5 12/05/2018   PLT 255.0 12/05/2018      Component Value Date/Time   NA 137 12/05/2018 1432   NA 139 05/12/2014 0000   NA 138 04/18/2012 1346   K 4.3 12/05/2018 1432   K 4.0 04/18/2012 1346   CL 100 12/05/2018 1432   CL 104 04/18/2012 1346   CO2 28 12/05/2018 1432   CO2 25 04/18/2012 1346   GLUCOSE 114 (H) 12/05/2018 1432   GLUCOSE 93 04/18/2012 1346   BUN 10 12/05/2018 1432   BUN 10 05/12/2014 0000   BUN 17.2 04/18/2012 1346   CREATININE 0.87 12/05/2018 1432   CREATININE 1.0 04/18/2012 1346   CALCIUM 9.0 12/05/2018 1432   CALCIUM 9.1 04/18/2012 1346   PROT 6.8 03/29/2019 1400   PROT 6.8 04/18/2012 1346   ALBUMIN 4.3 03/29/2019 1400    ALBUMIN 3.6 04/18/2012 1346   AST 12 03/29/2019 1400   AST 15 04/18/2012 1346   ALT 11 03/29/2019 1400   ALT 12 04/18/2012 1346   ALKPHOS 63 03/29/2019 1400   ALKPHOS 78 04/18/2012 1346   BILITOT 0.5 03/29/2019 1400   BILITOT 0.38 04/18/2012 1346   Lab Results  Component Value Date   CHOL 274 (H) 12/05/2018   HDL 63.30 12/05/2018   LDLCALC 172 (H) 12/05/2018   TRIG 193.0 (H) 12/05/2018   CHOLHDL 4 12/05/2018   Lab Results  Component Value Date   HGBA1C 5.2 12/05/2018   Lab Results  Component Value Date   ZOXWRUEA54 098 08/12/2014   Lab Results  Component Value Date   TSH 1.79 12/05/2018      ASSESSMENT AND PLAN 64 y.o. year old female  has a past medical history of Anemia, unspecified (04/18/2012), Anxiety, Bell's palsy, Bipolar 2 disorder ([place]), Depression, History of chickenpox, Hypercholesteremia, Hypothyroidism (02/01/2012), Ischemic colitis ([place]) (2010), Menopause, Migraine, OSA on CPAP, and Ulcerative colitis ([place]) (02/01/2012). here with:  1. OSA on CPAP  - CPAP compliance excellent - Good treatment of AHI  - Encourage patient to use CPAP nightly and > 4 hours each night - F/U in 1 year or sooner if needed   I spent 20 minutes of face-to-face and non-face-to-face time with patient.  This included previsit chart review, lab review, study review, order entry, electronic health record documentation,  patient education.  Ward Givens, MSN, NP-C 07/17/2019, 1:52 PM [facility] [address] [place], [place] [address] [phone]  I reviewed the above note and documentation by the Nurse Practitioner and agree with the history, exam, assessment and plan as outlined above. I was available for consultation. Star Age, MD, PhD [facility])

## 2019-07-17 NOTE — Patient Instructions (Signed)
Continue using CPAP nightly and greater than 4 hours each night °If your symptoms worsen or you develop new symptoms please let us know.  ° °

## 2019-07-18 DIAGNOSIS — G4733 Obstructive sleep apnea (adult) (pediatric): Secondary | ICD-10-CM | POA: Diagnosis not present

## 2019-07-25 DIAGNOSIS — Z961 Presence of intraocular lens: Secondary | ICD-10-CM | POA: Diagnosis not present

## 2019-07-25 DIAGNOSIS — H26493 Other secondary cataract, bilateral: Secondary | ICD-10-CM | POA: Diagnosis not present

## 2019-07-25 DIAGNOSIS — H26491 Other secondary cataract, right eye: Secondary | ICD-10-CM | POA: Diagnosis not present

## 2019-07-25 DIAGNOSIS — H18413 Arcus senilis, bilateral: Secondary | ICD-10-CM | POA: Diagnosis not present

## 2019-07-25 DIAGNOSIS — H26492 Other secondary cataract, left eye: Secondary | ICD-10-CM | POA: Diagnosis not present

## 2019-08-09 DIAGNOSIS — D485 Neoplasm of uncertain behavior of skin: Secondary | ICD-10-CM | POA: Diagnosis not present

## 2019-08-09 DIAGNOSIS — L821 Other seborrheic keratosis: Secondary | ICD-10-CM | POA: Diagnosis not present

## 2019-08-09 DIAGNOSIS — D2272 Melanocytic nevi of left lower limb, including hip: Secondary | ICD-10-CM | POA: Diagnosis not present

## 2019-08-09 DIAGNOSIS — D1801 Hemangioma of skin and subcutaneous tissue: Secondary | ICD-10-CM | POA: Diagnosis not present

## 2019-08-09 DIAGNOSIS — L820 Inflamed seborrheic keratosis: Secondary | ICD-10-CM | POA: Diagnosis not present

## 2019-08-09 DIAGNOSIS — D225 Melanocytic nevi of trunk: Secondary | ICD-10-CM | POA: Diagnosis not present

## 2019-08-12 ENCOUNTER — Ambulatory Visit: Payer: Self-pay

## 2019-08-12 ENCOUNTER — Other Ambulatory Visit: Payer: Self-pay

## 2019-08-12 ENCOUNTER — Encounter: Payer: Self-pay | Admitting: Physical Medicine and Rehabilitation

## 2019-08-12 ENCOUNTER — Ambulatory Visit: Payer: BC Managed Care – PPO | Admitting: Physical Medicine and Rehabilitation

## 2019-08-12 VITALS — BP 127/78 | HR 70

## 2019-08-12 DIAGNOSIS — M47816 Spondylosis without myelopathy or radiculopathy, lumbar region: Secondary | ICD-10-CM | POA: Diagnosis not present

## 2019-08-12 MED ORDER — METHYLPREDNISOLONE ACETATE 80 MG/ML IJ SUSP
80.0000 mg | Freq: Once | INTRAMUSCULAR | Status: AC
  Administered 2019-08-12: 80 mg

## 2019-08-12 NOTE — Progress Notes (Signed)
Pt states her pain in her right lower back with no leg pain. Pt states standing, blending and walking makes the pain worse. Pt states sitting and laying down helps the pain.   Numeric Pain Rating Scale and Functional Assessment Average Pain 6   In the last MONTH (on 0-10 scale) has pain interfered with the following?  1. General activity like being  able to carry out your everyday physical activities such as walking, climbing stairs, carrying groceries, or moving a chair?  Rating(8)   +Driver, -BT, -Dye Allergies.

## 2019-08-14 DIAGNOSIS — F3132 Bipolar disorder, current episode depressed, moderate: Secondary | ICD-10-CM | POA: Diagnosis not present

## 2019-08-14 NOTE — Procedures (Signed)
Lumbar Diagnostic Facet Joint Nerve Block with Fluoroscopic Guidance   Patient: Jessica Berger      Date of Birth: [birth year] MRN: [id number] PCP: Brunetta Jeans, PA-C      Visit Date: 08/12/2019   Universal Protocol:    Date/Time: 07/14/215:46 AM  Consent Given By: the patient  Position: PRONE  Additional Comments: Vital signs were monitored before and after the procedure. Patient was prepped and draped in the usual sterile fashion. The correct patient, procedure, and site was verified.   Injection Procedure Details:  Procedure Site One Meds Administered:  Meds ordered this encounter  Medications  . methylPREDNISolone acetate (DEPO-MEDROL) injection 80 mg     Laterality: Bilateral  Location/Site:  L5-S1  Needle size: 22 ga.  Needle type:spinal  Needle Placement: Oblique pedical  Findings:   -Comments: There was excellent flow of contrast along the articular pillars without intravascular flow.  Procedure Details: The fluoroscope beam is vertically oriented in AP and then obliqued 15 to 20 degrees to the ipsilateral side of the desired nerve to achieve the "Scotty dog" appearance.  The skin over the target area of the junction of the superior articulating process and the transverse process (sacral ala if blocking the L5 dorsal rami) was locally anesthetized with a 1 ml volume of 1% Lidocaine without Epinephrine.  The spinal needle was inserted and advanced in a trajectory view down to the target.   After contact with periosteum and negative aspirate for blood and CSF, correct placement without intravascular or epidural spread was confirmed by injecting 0.5 ml. of Isovue-250.  A spot radiograph was obtained of this image.    Next, a 0.5 ml. volume of the injectate described above was injected. The needle was then redirected to the other facet joint nerves mentioned above if needed.  Prior to the procedure, the patient was given a Pain Diary which was completed for  baseline measurements.  After the procedure, the patient rated their pain every 30 minutes and will continue rating at this frequency for a total of 5 hours.  The patient has been asked to complete the Diary and return to [place] by mail, fax or hand delivered as soon as possible.   Additional Comments:  The patient tolerated the procedure well Dressing: 2 x 2 sterile gauze and Band-Aid    Post-procedure details: Patient was observed during the procedure. Post-procedure instructions were reviewed.  Patient left the clinic in stable condition.

## 2019-08-14 NOTE — Progress Notes (Signed)
Jessica Berger - 64 y.o. female MRN [id number]  Date of birth: Apr 03, 1955  Office Visit Note: Visit Date: 08/12/2019 PCP: Brunetta Jeans, PA-C Referred by: Brunetta Jeans, PA-C  Subjective: Chief Complaint  Patient presents with  . Lower Back - Pain   HPI:  Jessica Berger is a 64 y.o. female who comes in today at the request of Dr. Laurence Spates for planned Bilateral L5-S1 Lumbar facet/medial branch block with fluoroscopic guidance.  The patient has failed conservative care including home exercise, medications, time and activity modification.  This injection will be diagnostic and hopefully therapeutic.  Please see requesting physician notes for further details and justification.    She reports continued recalcitrant axial back pain with no leg pain.  This is worse with standing and bending and twisting.  She has imaging showing arthritis at L5-S1 without any nerve compression or other sources of pain.  She has had physical therapy.  She has had 1 set of diagnostic blocks which gave her almost complete relief and actually lasted a few weeks and so she did not return to see [place] initially.  We will complete second set of diagnostic blocks today and look at radiofrequency ablation for more definitive care as part of a pain management program.  All this would be done with fluoroscopic guidance as noted in the procedure report.  ROS Otherwise per HPI.  Assessment & Plan: Visit Diagnoses:  1. Spondylosis without myelopathy or radiculopathy, lumbar region     Plan: No additional findings.   Meds & Orders:  Meds ordered this encounter  Medications  . methylPREDNISolone acetate (DEPO-MEDROL) injection 80 mg    Orders Placed This Encounter  Procedures  . Facet Injection  . XR C-ARM NO REPORT    Follow-up: Return for Review Pain Diary.   Procedures: No procedures performed  Lumbar Diagnostic Facet Joint Nerve Block with Fluoroscopic Guidance   Patient: Jessica Berger      Date  of Birth: 16-Nov-1955 MRN: [id number] PCP: Brunetta Jeans, PA-C      Visit Date: 08/12/2019   Universal Protocol:    Date/Time: 07/14/215:46 AM  Consent Given By: the patient  Position: PRONE  Additional Comments: Vital signs were monitored before and after the procedure. Patient was prepped and draped in the usual sterile fashion. The correct patient, procedure, and site was verified.   Injection Procedure Details:  Procedure Site One Meds Administered:  Meds ordered this encounter  Medications  . methylPREDNISolone acetate (DEPO-MEDROL) injection 80 mg     Laterality: Bilateral  Location/Site:  L5-S1  Needle size: 22 ga.  Needle type:spinal  Needle Placement: Oblique pedical  Findings:   -Comments: There was excellent flow of contrast along the articular pillars without intravascular flow.  Procedure Details: The fluoroscope beam is vertically oriented in AP and then obliqued 15 to 20 degrees to the ipsilateral side of the desired nerve to achieve the "Scotty dog" appearance.  The skin over the target area of the junction of the superior articulating process and the transverse process (sacral ala if blocking the L5 dorsal rami) was locally anesthetized with a 1 ml volume of 1% Lidocaine without Epinephrine.  The spinal needle was inserted and advanced in a trajectory view down to the target.   After contact with periosteum and negative aspirate for blood and CSF, correct placement without intravascular or epidural spread was confirmed by injecting 0.5 ml. of Isovue-250.  A spot radiograph was obtained of this image.  Next, a 0.5 ml. volume of the injectate described above was injected. The needle was then redirected to the other facet joint nerves mentioned above if needed.  Prior to the procedure, the patient was given a Pain Diary which was completed for baseline measurements.  After the procedure, the patient rated their pain every 30 minutes and will continue  rating at this frequency for a total of 5 hours.  The patient has been asked to complete the Diary and return to [place] by mail, fax or hand delivered as soon as possible.   Additional Comments:  The patient tolerated the procedure well Dressing: 2 x 2 sterile gauze and Band-Aid    Post-procedure details: Patient was observed during the procedure. Post-procedure instructions were reviewed.  Patient left the clinic in stable condition.    Clinical History: MRI LUMBAR SPINE WITHOUT CONTRAST  TECHNIQUE: Multiplanar, multisequence MR imaging of the lumbar spine was performed. No intravenous contrast was administered.  COMPARISON: Plain films lumbar spine 02/13/2019.  FINDINGS: Segmentation: Standard.  Alignment: Maintained.  Vertebrae: No fracture, evidence of discitis, or bone lesion.  Conus medullaris and cauda equina: Conus extends to the L1 level. Conus and cauda equina appear normal.  Paraspinal and other soft tissues: Negative.  Disc levels:  The T11-12 and T12-L1 are imaged in the sagittal plane only. Minimal disc bulging is present at these levels without stenosis.  L1-2: Minimal disc bulge without stenosis.  L2-3: Negative.  L3-4: There is loss of disc space height with a very shallow bulge. No stenosis.  L4-5: Negative.  L5-S1: Moderate facet arthropathy is worse on the right. No disc bulge or protrusion. No stenosis.  IMPRESSION: Mild lumbar degenerative disease without central canal or foraminal stenosis. Moderate bilateral facet arthropathy at L5-S1 is worse on the right.   Electronically Signed  By: Inge Rise M.D.  On: 02/23/2019 17:31     Objective:  VS:  HT:    WT:   BMI:     BP:127/78  HR:70bpm  TEMP: ( )  RESP:  Physical Exam Constitutional:      General: She is not in acute distress.    Appearance: Normal appearance. She is not ill-appearing.  HENT:     Head: Normocephalic and atraumatic.     Right Ear: External ear  normal.     Left Ear: External ear normal.  Eyes:     Extraocular Movements: Extraocular movements intact.  Cardiovascular:     Rate and Rhythm: Normal rate.     Pulses: Normal pulses.  Musculoskeletal:     Right lower leg: No edema.     Left lower leg: No edema.     Comments: Patient has good distal strength with no pain over the greater trochanters.  No clonus or focal weakness. Patient somewhat slow to rise from a seated position to full extension.  There is concordant low back pain with facet loading and lumbar spine extension rotation.  There are no definitive trigger points but the patient is somewhat tender across the lower back and PSIS.  There is no pain with hip rotation.  Skin:    Findings: No erythema, lesion or rash.  Neurological:     General: No focal deficit present.     Mental Status: She is alert and oriented to person, place, and time.     Sensory: No sensory deficit.     Motor: No weakness or abnormal muscle tone.     Coordination: Coordination normal.  Psychiatric:  Mood and Affect: Mood normal.        Behavior: Behavior normal.      Imaging: No results found.

## 2019-08-15 DIAGNOSIS — H26493 Other secondary cataract, bilateral: Secondary | ICD-10-CM | POA: Diagnosis not present

## 2019-08-15 DIAGNOSIS — H26492 Other secondary cataract, left eye: Secondary | ICD-10-CM | POA: Diagnosis not present

## 2019-08-17 DIAGNOSIS — G4733 Obstructive sleep apnea (adult) (pediatric): Secondary | ICD-10-CM | POA: Diagnosis not present

## 2019-08-21 IMAGING — MG 2D DIGITAL SCREENING BILATERAL MAMMOGRAM WITH 3D TOMO WITH CAD
8 of 12 series · 8 of 28 positions shown · non-contrast
Comparison: Previous exam(s).

CLINICAL DATA: Screening.

EXAM:
2D DIGITAL SCREENING BILATERAL MAMMOGRAM WITH 3D TOMO WITH CAD

[R CC synth-2D]
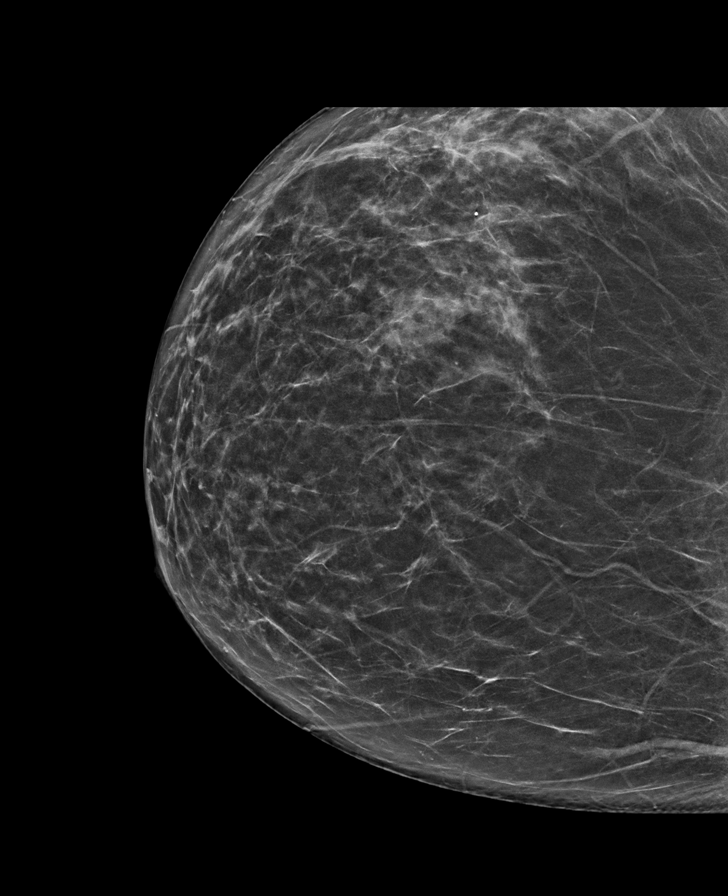

[R MLO]
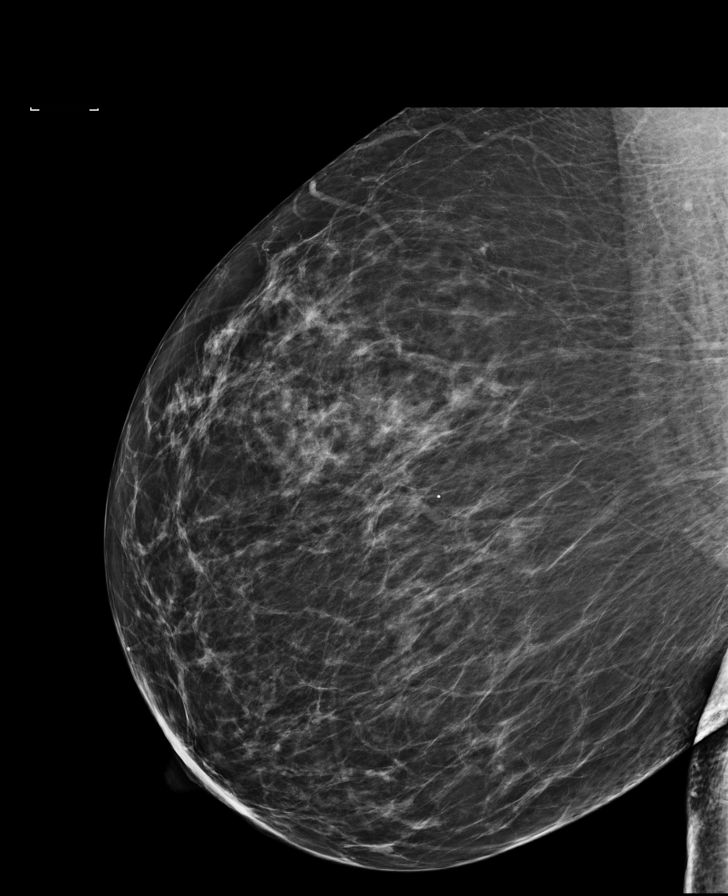

[R CC]
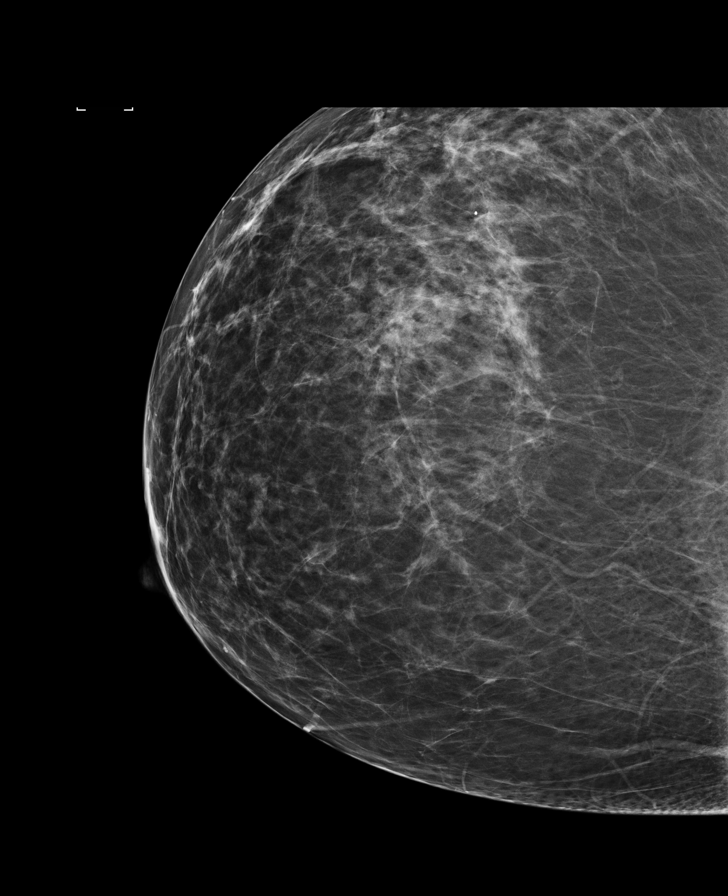

[L MLO]
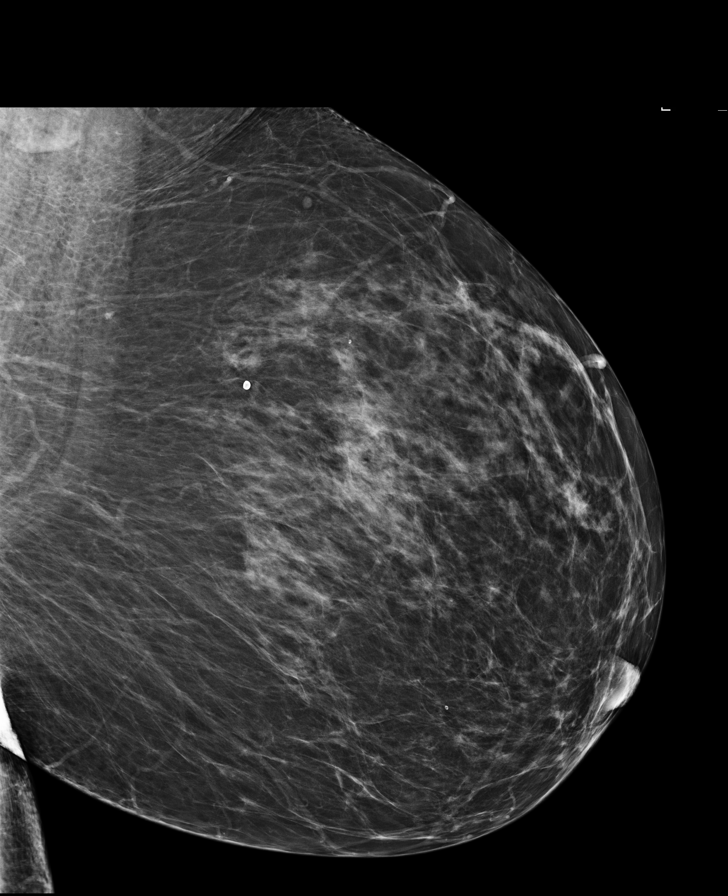

[L CC]
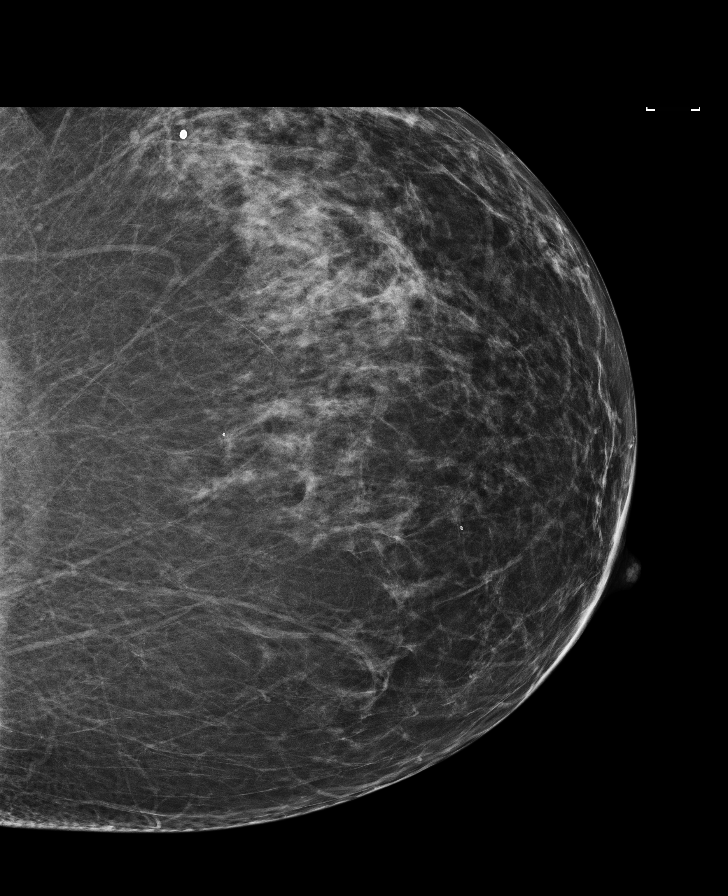

[L MLO synth-2D]
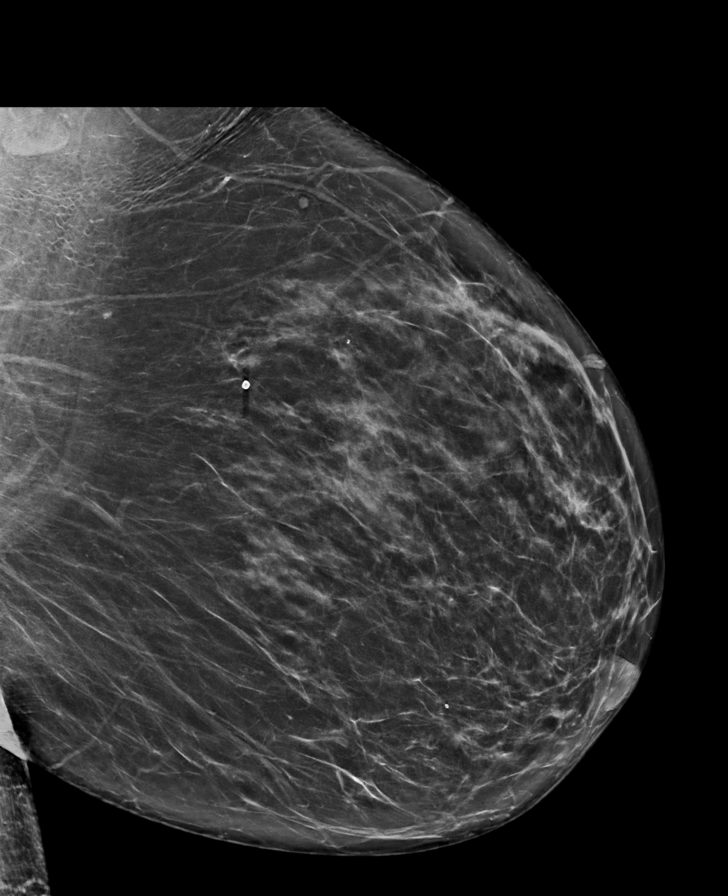

[L CC synth-2D]
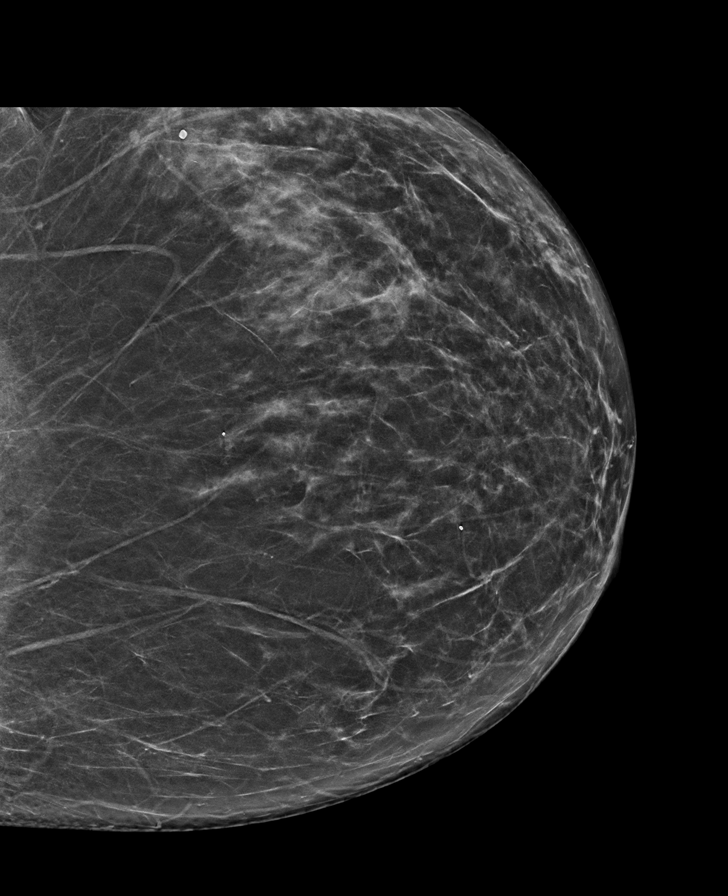

[R MLO synth-2D]
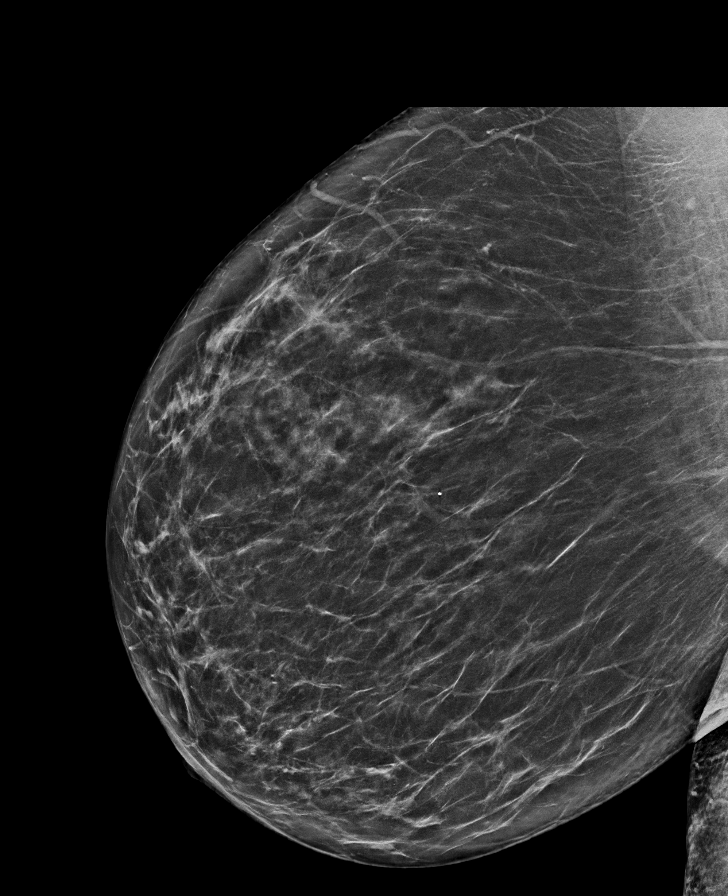

[8 of 28 positions shown; findings below may reference images not displayed]

ACR Breast Density Category b: There are scattered areas of
fibroglandular density.
FINDINGS: There are no findings suspicious for malignancy. Images were
processed with CAD.
IMPRESSION: No mammographic evidence of malignancy. A result letter of this
screening mammogram will be mailed directly to the patient.

RECOMMENDATION:
Screening mammogram in one year. (Code:GE-P-ZS0)

BI-RADS CATEGORY  1: Negative.

## 2019-08-27 DIAGNOSIS — L988 Other specified disorders of the skin and subcutaneous tissue: Secondary | ICD-10-CM | POA: Diagnosis not present

## 2019-08-27 DIAGNOSIS — D485 Neoplasm of uncertain behavior of skin: Secondary | ICD-10-CM | POA: Diagnosis not present

## 2019-08-27 DIAGNOSIS — D225 Melanocytic nevi of trunk: Secondary | ICD-10-CM | POA: Diagnosis not present

## 2019-09-06 ENCOUNTER — Ambulatory Visit: Payer: BC Managed Care – PPO | Admitting: Certified Nurse Midwife

## 2019-09-16 DIAGNOSIS — G4733 Obstructive sleep apnea (adult) (pediatric): Secondary | ICD-10-CM | POA: Diagnosis not present

## 2019-09-21 ENCOUNTER — Encounter: Payer: Self-pay | Admitting: Physical Medicine and Rehabilitation

## 2019-09-24 NOTE — Progress Notes (Signed)
64 y.o. G66P1001 Married White or Caucasian Not Hispanic or Latino female here for annual exam.   No vaginal bleeding. No dyspareunia.     Still with occasional hot flashes, they have improved. Not having hot flashes.   Patient's last menstrual period was 01/31/2002 (approximate).          Sexually active: Yes.    The current method of family planning is post menopausal status.    Exercising: Yes.    dvd workout and bike  Smoker:  no  Health Maintenance: Pap:  09/04/18 Wnl Hr HPV Neg 05/12/15 Neg Hr HPV Neg  History of abnormal Pap:  no MMG:  04/03/19 Density C Bi-rads 1 neg  BMD:   2018 normal. Colonoscopy: 2019 follow up 3 years  TDaP:  2014 Gardasil: none   reports that she quit smoking about 28 years ago. She has never used smokeless tobacco. She reports current alcohol use of about 6.0 standard drinks of alcohol per week. She reports that she does not use drugs. Daughter in [place], no grandchildren. Retired Agricultural engineer.   Past Medical History:  Diagnosis Date  . Anemia, unspecified 04/18/2012  . Anxiety   . Bell's palsy   . Bipolar 2 disorder ([place])   . Depression   . History of chickenpox   . Hypercholesteremia   . Hypothyroidism 02/01/2012  . Ischemic colitis ([place]) 2010  . Menopause   . Migraine   . OSA on CPAP   . Ulcerative colitis ([place]) 02/01/2012  No GI issues for years.  Hypothyroidism, on synthroid for ~5 years.   Past Surgical History:  Procedure Laterality Date  . CATARACT EXTRACTION W/ INTRAOCULAR LENS IMPLANT Left 03/14/2016  . CATARACT EXTRACTION W/ INTRAOCULAR LENS IMPLANT Right 04/18/2016  . NASAL SEPTUM SURGERY      Current Outpatient Medications  Medication Sig Dispense Refill  . APPLE CIDER VINEGAR PO Take by mouth daily in the afternoon.    . Cholecalciferol (VITAMIN D PO) Take by mouth.    . clonazePAM (KLONOPIN) 0.5 MG tablet Take 1 tablet by mouth daily.    . fexofenadine (ALLEGRA) 180 MG tablet Take 180 mg by mouth daily.    . fluticasone  (FLONASE) 50 MCG/ACT nasal spray Place into both nostrils daily.    Marland Kitchen lamoTRIgine (LAMICTAL) 100 MG tablet     . LATUDA 80 MG TABS tablet Take 1 tablet by mouth daily.    Marland Kitchen levothyroxine (SYNTHROID) 112 MCG tablet TAKE 1 TABLET DAILY 90 tablet 2  . NEXIUM 40 MG capsule Take 40 mg by mouth daily at 12 noon.     . propranolol (INDERAL) 20 MG tablet Take 1 tablet by mouth 2 (two) times daily.     . Rhubarb (ESTROVEN MENOPAUSE RELIEF PO) Take 1 tablet by mouth daily.    . TURMERIC CURCUMIN PO Take by mouth daily.    . vitamin B-12 (CYANOCOBALAMIN) 1000 MCG tablet Take 1,000 mcg by mouth daily.     No current facility-administered medications for this visit.  She is taking calcium, magnesium, zinc and vit d  Family History  Problem Relation Age of Onset  . Colon cancer Father 27       Colon  . Hypertension Father   . Breast cancer Maternal Grandmother   . Heart failure Mother   . Osteoarthritis Mother   . Depression Mother   . Hypertension Mother   . Stroke Mother   . Asthma Mother   . Cancer Paternal Grandmother  skin cancer, nose  . Heart attack Paternal Grandfather   . Cancer Maternal Grandfather        lung  . Angioedema Neg Hx   . Allergic rhinitis Neg Hx   . Eczema Neg Hx   . Urticaria Neg Hx     Review of Systems  Psychiatric/Behavioral: Positive for dysphoric mood. The patient is nervous/anxious.   All other systems reviewed and are negative.   Exam:   BP 126/72   Pulse 85   Ht 5' 6"  (1.676 m)   Wt 249 lb (112.9 kg)   LMP 01/31/2002 (Approximate)   SpO2 97%   BMI 40.19 kg/m   Weight change: @WEIGHTCHANGE @ Height:   Height: 5' 6"  (167.6 cm)  Ht Readings from Last 3 Encounters:  09/25/19 5' 6"  (1.676 m)  07/17/19 5' 7"  (1.702 m)  02/13/19 5' 6.5" (1.689 m)    General appearance: alert, cooperative and appears stated age Head: Normocephalic, without obvious abnormality, atraumatic Neck: no adenopathy, supple, symmetrical, trachea midline and thyroid  normal to inspection and palpation Lungs: clear to auscultation bilaterally Cardiovascular: regular rate and rhythm Breasts: normal appearance, no masses or tenderness Abdomen: soft, non-tender; non distended,  no masses,  no organomegaly Extremities: extremities normal, atraumatic, no cyanosis or edema Skin: Skin color, texture, turgor normal. No rashes or lesions Lymph nodes: Cervical, supraclavicular, and axillary nodes normal. No abnormal inguinal nodes palpated Neurologic: Grossly normal   Pelvic: External genitalia:  no lesions              Urethra:  normal appearing urethra with no masses, tenderness or lesions              Bartholins and Skenes: normal                 Vagina: normal appearing vagina with normal color and discharge, no lesions              Cervix: no lesions               Bimanual Exam:  Uterus:  no masses or tenderness              Adnexa: no mass, fullness, tenderness               Rectovaginal: Confirms               Anus:  normal sphincter tone, no lesions  Gae Dry chaperoned for the exam.  A:  Well Woman with normal exam  P:   No Pap this year  Discussed breast self exam  Discussed calcium and vit D intake  Labs with primary  Colonoscopy next year  Mammogram UTD  Normal DEXA in 2018

## 2019-09-25 ENCOUNTER — Encounter: Payer: Self-pay | Admitting: Obstetrics and Gynecology

## 2019-09-25 ENCOUNTER — Ambulatory Visit: Payer: BC Managed Care – PPO | Admitting: Obstetrics and Gynecology

## 2019-09-25 ENCOUNTER — Other Ambulatory Visit: Payer: Self-pay

## 2019-09-25 VITALS — BP 126/72 | HR 85 | Ht 66.0 in | Wt 249.0 lb

## 2019-09-25 DIAGNOSIS — Z01419 Encounter for gynecological examination (general) (routine) without abnormal findings: Secondary | ICD-10-CM

## 2019-09-25 NOTE — Patient Instructions (Signed)
EXERCISE AND DIET:  We recommended that you start or continue a regular exercise program for good health. Regular exercise means any activity that makes your heart beat faster and makes you sweat.  We recommend exercising at least 30 minutes per day at least 3 days a week, preferably 4 or 5.  We also recommend a diet low in fat and sugar.  Inactivity, poor dietary choices and obesity can cause diabetes, heart attack, stroke, and kidney damage, among others.    ALCOHOL AND SMOKING:  Women should limit their alcohol intake to no more than 7 drinks/beers/glasses of wine (combined, not each!) per week. Moderation of alcohol intake to this level decreases your risk of breast cancer and liver damage. And of course, no recreational drugs are part of a healthy lifestyle.  And absolutely no smoking or even second hand smoke. Most people know smoking can cause heart and lung diseases, but did you know it also contributes to weakening of your bones? Aging of your skin?  Yellowing of your teeth and nails?  CALCIUM AND VITAMIN D:  Adequate intake of calcium and Vitamin D are recommended.  The recommendations for exact amounts of these supplements seem to change often, but generally speaking 1,200 mg of calcium (between diet and supplement) and 800 units of Vitamin D per day seems prudent. Certain women may benefit from higher intake of Vitamin D.  If you are among these women, your doctor will have told you during your visit.    PAP SMEARS:  Pap smears, to check for cervical cancer or precancers,  have traditionally been done yearly, although recent scientific advances have shown that most women can have pap smears less often.  However, every woman still should have a physical exam from her gynecologist every year. It will include a breast check, inspection of the vulva and vagina to check for abnormal growths or skin changes, a visual exam of the cervix, and then an exam to evaluate the size and shape of the uterus and  ovaries.  And after 63 years of age, a rectal exam is indicated to check for rectal cancers. We will also provide age appropriate advice regarding health maintenance, like when you should have certain vaccines, screening for sexually transmitted diseases, bone density testing, colonoscopy, mammograms, etc.   MAMMOGRAMS:  All women over 40 years old should have a yearly mammogram. Many facilities now offer a "3D" mammogram, which may cost around $50 extra out of pocket. If possible,  we recommend you accept the option to have the 3D mammogram performed.  It both reduces the number of women who will be called back for extra views which then turn out to be normal, and it is better than the routine mammogram at detecting truly abnormal areas.    COLON CANCER SCREENING: Now recommend starting at age 45. At this time colonoscopy is not covered for routine screening until 50. There are take home tests that can be done between 45-49.   COLONOSCOPY:  Colonoscopy to screen for colon cancer is recommended for all women at age 50.  We know, you hate the idea of the prep.  We agree, BUT, having colon cancer and not knowing it is worse!!  Colon cancer so often starts as a polyp that can be seen and removed at colonscopy, which can quite literally save your life!  And if your first colonoscopy is normal and you have no family history of colon cancer, most women don't have to have it again for   10 years.  Once every ten years, you can do something that may end up saving your life, right?  We will be happy to help you get it scheduled when you are ready.  Be sure to check your insurance coverage so you understand how much it will cost.  It may be covered as a preventative service at no cost, but you should check your particular policy.      Breast Self-Awareness Breast self-awareness means being familiar with how your breasts look and feel. It involves checking your breasts regularly and reporting any changes to your  health care provider. Practicing breast self-awareness is important. A change in your breasts can be a sign of a serious medical problem. Being familiar with how your breasts look and feel allows you to find any problems early, when treatment is more likely to be successful. All women should practice breast self-awareness, including women who have had breast implants. How to do a breast self-exam One way to learn what is normal for your breasts and whether your breasts are changing is to do a breast self-exam. To do a breast self-exam: Look for Changes  1. Remove all the clothing above your waist. 2. Stand in front of a mirror in a room with good lighting. 3. Put your hands on your hips. 4. Push your hands firmly downward. 5. Compare your breasts in the mirror. Look for differences between them (asymmetry), such as: ? Differences in shape. ? Differences in size. ? Puckers, dips, and bumps in one breast and not the other. 6. Look at each breast for changes in your skin, such as: ? Redness. ? Scaly areas. 7. Look for changes in your nipples, such as: ? Discharge. ? Bleeding. ? Dimpling. ? Redness. ? A change in position. Feel for Changes Carefully feel your breasts for lumps and changes. It is best to do this while lying on your back on the floor and again while sitting or standing in the shower or tub with soapy water on your skin. Feel each breast in the following way:  Place the arm on the side of the breast you are examining above your head.  Feel your breast with the other hand.  Start in the nipple area and make  inch (2 cm) overlapping circles to feel your breast. Use the pads of your three middle fingers to do this. Apply light pressure, then medium pressure, then firm pressure. The light pressure will allow you to feel the tissue closest to the skin. The medium pressure will allow you to feel the tissue that is a little deeper. The firm pressure will allow you to feel the tissue  close to the ribs.  Continue the overlapping circles, moving downward over the breast until you feel your ribs below your breast.  Move one finger-width toward the center of the body. Continue to use the  inch (2 cm) overlapping circles to feel your breast as you move slowly up toward your collarbone.  Continue the up and down exam using all three pressures until you reach your armpit.  Write Down What You Find  Write down what is normal for each breast and any changes that you find. Keep a written record with breast changes or normal findings for each breast. By writing this information down, you do not need to depend only on memory for size, tenderness, or location. Write down where you are in your menstrual cycle, if you are still menstruating. If you are having trouble noticing differences   in your breasts, do not get discouraged. With time you will become more familiar with the variations in your breasts and more comfortable with the exam. How often should I examine my breasts? Examine your breasts every month. If you are breastfeeding, the best time to examine your breasts is after a feeding or after using a breast pump. If you menstruate, the best time to examine your breasts is 5-7 days after your period is over. During your period, your breasts are lumpier, and it may be more difficult to notice changes. When should I see my health care provider? See your health care provider if you notice:  A change in shape or size of your breasts or nipples.  A change in the skin of your breast or nipples, such as a reddened or scaly area.  Unusual discharge from your nipples.  A lump or thick area that was not there before.  Pain in your breasts.  Anything that concerns you.  

## 2019-10-24 NOTE — Telephone Encounter (Signed)
Submitted pt Auth yesterday, today its still pending. Ref# [id number]

## 2019-10-27 ENCOUNTER — Encounter: Payer: Self-pay | Admitting: Physical Medicine and Rehabilitation

## 2019-11-05 ENCOUNTER — Encounter: Payer: Self-pay | Admitting: Physical Medicine and Rehabilitation

## 2019-11-08 NOTE — Telephone Encounter (Signed)
Pt insurance called wanting the notes on pt relief percent.

## 2019-11-12 DIAGNOSIS — F3132 Bipolar disorder, current episode depressed, moderate: Secondary | ICD-10-CM | POA: Diagnosis not present

## 2019-11-12 DIAGNOSIS — F411 Generalized anxiety disorder: Secondary | ICD-10-CM | POA: Diagnosis not present

## 2019-11-18 ENCOUNTER — Encounter: Payer: Self-pay | Admitting: Physical Medicine and Rehabilitation

## 2019-11-19 ENCOUNTER — Other Ambulatory Visit: Payer: Self-pay | Admitting: Physician Assistant

## 2019-11-30 ENCOUNTER — Encounter: Payer: Self-pay | Admitting: Physician Assistant

## 2019-12-02 ENCOUNTER — Encounter: Payer: Self-pay | Admitting: Family Medicine

## 2019-12-03 ENCOUNTER — Telehealth: Payer: Self-pay

## 2019-12-03 ENCOUNTER — Other Ambulatory Visit: Payer: Self-pay

## 2019-12-03 ENCOUNTER — Ambulatory Visit (INDEPENDENT_AMBULATORY_CARE_PROVIDER_SITE_OTHER): Payer: BC Managed Care – PPO

## 2019-12-03 DIAGNOSIS — M545 Low back pain, unspecified: Secondary | ICD-10-CM

## 2019-12-03 DIAGNOSIS — M47816 Spondylosis without myelopathy or radiculopathy, lumbar region: Secondary | ICD-10-CM

## 2019-12-03 DIAGNOSIS — Z23 Encounter for immunization: Secondary | ICD-10-CM

## 2019-12-03 DIAGNOSIS — G8929 Other chronic pain: Secondary | ICD-10-CM

## 2019-12-03 NOTE — Telephone Encounter (Signed)
Called pt and she informed me that last inj on 08/12/19 worked out [employer]. Pt states she was pain free for a month. Pt state she had no new injury and she would really like if she could get the RFA inj.

## 2019-12-04 NOTE — Telephone Encounter (Signed)
Jessica Berger - 64 y.o. female MRN [id number]  Date of birth: 10-26-1955  Office Visit Note: Visit Date: @[facility] @ PCP: Brunetta Jeans, PA-C Referred by: No ref. provider found  Subjective: Axial low back pain  HPI: Jessica Berger is a 64 y.o. female who with history of chronic severe axial low back pain worse with standing and ambulating and going from sit to stand in full extension.  Exam has been consistent with facet mediated back pain worsening with facet loading.  She has MRI evidence which is reviewed again below facet arthropathy at L5-S1 without any sign of stenosis or nerve compression or disc herniation.  She has no radicular leg complaints.  She has had no specific injury no trauma and no prior lumbar surgery.  She has had physical therapy and medication management without relief and all these notes can be reviewed as well as Dr. Eunice Blase.  She has had double diagnostic medial branch blocks at L5-S1.  Interestingly 1 and February and 1 in July both giving her almost 100% relief for about a month.  She actually got pretty uncommon relief longevity from each injection.  There was some confusion with her about the role of diagnostic injections and then looking at radiofrequency ablation which we have talked about at length.  We did have a pain diary for the first injection and that can be reviewed but she did get more than 80% relief.  Last injection that we performed she reports again today on a phone call conversation that she received almost 100% relief for about a month.  She was very pleased with the results and does want to proceed with ablation.  Assessment & Plan: Visit Diagnoses:  1. Spondylosis without myelopathy or radiculopathy, lumbar region   2. Chronic bilateral low back pain without sciatica     Plan:   1.  Radiofrequency ablation of the L5-S1 facet joints after bilateral double diagnostic medial branch blocks at the same level with more than 80% relief as  documented in pain diary and today documented on phone conversation with her.  She meets all the other criteria perfectly for radiofrequency ablation I think that some neck step for definitive care. Prior injection did offer more than 50% relief as noted above and did give the patient more functional ability for activities of daily living and was also beneficial in that it did reduce their medication requirement.  They have had physical therapy and continue with home exercises.  Current medication management is not beneficial in increasing their functional status.  Procedures are done as part of a comprehensive orthopedic and pain management program with access to in-house orthopedics, spine surgery and physical therapy as well as access to [facility] if needed.   Meds & Orders: No orders of the defined types were placed in this encounter.  No orders of the defined types were placed in this encounter.   Follow-up: No follow-ups on file.   Procedures:  No notes on file   Clinical History: MRI LUMBAR SPINE WITHOUT CONTRAST  TECHNIQUE: Multiplanar, multisequence MR imaging of the lumbar spine was performed. No intravenous contrast was administered.  COMPARISON: Plain films lumbar spine 02/13/2019.  FINDINGS: Segmentation: Standard.  Alignment: Maintained.  Vertebrae: No fracture, evidence of discitis, or bone lesion.  Conus medullaris and cauda equina: Conus extends to the L1 level. Conus and cauda equina appear normal.  Paraspinal and other soft tissues: Negative.  Disc levels:  The T11-12 and  T12-L1 are imaged in the sagittal plane only. Minimal disc bulging is present at these levels without stenosis.  L1-2: Minimal disc bulge without stenosis.  L2-3: Negative.  L3-4: There is loss of disc space height with a very shallow bulge. No stenosis.  L4-5: Negative.  L5-S1: Moderate facet arthropathy is worse on the right. No  disc bulge or protrusion. No stenosis.  IMPRESSION: Mild lumbar degenerative disease without central canal or foraminal stenosis. Moderate bilateral facet arthropathy at L5-S1 is worse on the right.   Electronically Signed  By: Inge Rise M.D.  On: 02/23/2019 17:31   She reports that she quit smoking about 28 years ago. She has never used smokeless tobacco.  Recent Labs    12/05/18 1432  HGBA1C 5.2    Objective:  VS:  HT:@FLOW (11::1)@   WT:@FLOW (14::1)@  BMI:@FLOW (301070::1)@    BP:@FLOW (5::3)@  HR:@FLOW (8::1)@bpm   TEMP:@FLOW (6::1)@(@FLOW (7::1)@)  RESP:@FLOW (10::1)@ @PHYSEXAM @  @PHYSICALEXAMORTHO @  Imaging: No results found.  Past Medical/Family/Surgical/Social History: Medications & Allergies reviewed per EMR, new medications updated. Patient Active Problem List   Diagnosis Date Noted  . Seasonal and perennial allergic rhinitis 05/18/2017  . Easy bruising 07/18/2016  . Hypothyroidism 07/15/2016  . Bipolar 2 disorder ([place])   . Ischemic colitis ([place])   . Anemia, unspecified 04/18/2012  . Ulcerative colitis ([place]) 02/01/2012   Past Medical History:  Diagnosis Date  . Anemia, unspecified 04/18/2012  . Anxiety   . Bell's palsy   . Bipolar 2 disorder ([place])   . Depression   . History of chickenpox   . Hypercholesteremia   . Hypothyroidism 02/01/2012  . Ischemic colitis ([place]) 2010  . Menopause   . Migraine   . OSA on CPAP   . Ulcerative colitis ([place]) 02/01/2012   Family History  Problem Relation Age of Onset  . Colon cancer Father 40       Colon  . Hypertension Father   . Breast cancer Maternal Grandmother   . Heart failure Mother   . Osteoarthritis Mother   . Depression Mother   . Hypertension Mother   . Stroke Mother   . Asthma Mother   . Cancer Paternal Grandmother        skin cancer, nose  . Heart attack Paternal Grandfather   . Cancer Maternal Grandfather        lung  . Angioedema Neg Hx   . Allergic rhinitis Neg Hx   . Eczema Neg Hx   .  Urticaria Neg Hx    Past Surgical History:  Procedure Laterality Date  . CATARACT EXTRACTION W/ INTRAOCULAR LENS IMPLANT Left 03/14/2016  . CATARACT EXTRACTION W/ INTRAOCULAR LENS IMPLANT Right 04/18/2016  . NASAL SEPTUM SURGERY     Social History   Occupational History  . Occupation: N/A  Tobacco Use  . Smoking status: Former Smoker    Quit date: 04/25/1991    Years since quitting: 28.6  . Smokeless tobacco: Never Used  Vaping Use  . Vaping Use: Never used  Substance and Sexual Activity  . Alcohol use: Yes    Alcohol/week: 6.0 standard drinks    Types: 6 Standard drinks or equivalent per week    Comment: alcohol about 6 drinks a week  . Drug use: No  . Sexual activity: Yes    Partners: Male    Birth control/protection: Post-menopausal, Other-see comments    Comment: husband vasectomy

## 2019-12-04 NOTE — Telephone Encounter (Signed)
Note written on this patient for ablation for approval

## 2019-12-05 NOTE — Telephone Encounter (Signed)
Pt insurance is PENDING.

## 2019-12-09 NOTE — Telephone Encounter (Signed)
Pt insurance is still PENDING.

## 2019-12-11 NOTE — Telephone Encounter (Signed)
Pt insurance is still PENDING.

## 2019-12-12 NOTE — Telephone Encounter (Signed)
Pt was approve, Auth# [id number]. Pt can be sch.

## 2019-12-12 NOTE — Telephone Encounter (Signed)
Called pt and lvm #1

## 2019-12-13 ENCOUNTER — Telehealth: Payer: Self-pay | Admitting: Physical Medicine and Rehabilitation

## 2019-12-13 NOTE — Telephone Encounter (Signed)
Called pt and sch 12/22.

## 2019-12-13 NOTE — Telephone Encounter (Signed)
Called pt and lvm #2

## 2019-12-13 NOTE — Telephone Encounter (Signed)
Pt called stating she would be available for the rest of the afternoon if Sunday Corn could try her again   [phone]

## 2019-12-28 DIAGNOSIS — G4733 Obstructive sleep apnea (adult) (pediatric): Secondary | ICD-10-CM | POA: Diagnosis not present

## 2020-01-22 ENCOUNTER — Ambulatory Visit: Payer: BC Managed Care – PPO | Admitting: Physical Medicine and Rehabilitation

## 2020-01-22 ENCOUNTER — Encounter: Payer: Self-pay | Admitting: Physical Medicine and Rehabilitation

## 2020-01-22 ENCOUNTER — Ambulatory Visit: Payer: Self-pay

## 2020-01-22 ENCOUNTER — Other Ambulatory Visit: Payer: Self-pay

## 2020-01-22 VITALS — BP 166/88 | HR 68

## 2020-01-22 DIAGNOSIS — M47816 Spondylosis without myelopathy or radiculopathy, lumbar region: Secondary | ICD-10-CM

## 2020-01-22 DIAGNOSIS — G8929 Other chronic pain: Secondary | ICD-10-CM

## 2020-01-22 DIAGNOSIS — M545 Low back pain, unspecified: Secondary | ICD-10-CM

## 2020-01-22 MED ORDER — BETAMETHASONE SOD PHOS & ACET 6 (3-3) MG/ML IJ SUSP
12.0000 mg | Freq: Once | INTRAMUSCULAR | Status: AC
  Administered 2020-01-22: 12 mg

## 2020-01-22 NOTE — Progress Notes (Signed)
Jessica Berger - 64 y.o. female MRN [id number]  Date of birth: 06-05-1955  Office Visit Note: Visit Date: 01/22/2020 PCP: Brunetta Jeans, PA-C Referred by: Brunetta Jeans, PA-C  Subjective: Chief Complaint  Patient presents with  . Lower Back - Pain   HPI:  Jessica Berger is a 64 y.o. female who comes in today for planned radiofrequency ablation of the Bilateral L5-S1 Lumbar facet joints. This would be ablation of the corresponding medial branches and/or dorsal rami.  Patient has had double diagnostic blocks with more than 50% relief.  These are documented on pain diary.  They have had chronic back pain for quite some time, more than 3 months, which has been an ongoing situation with recalcitrant axial back pain.  They have no radicular pain.  Their axial pain is worse with standing and ambulating and on exam today with facet loading.  They have had physical therapy as well as home exercise program.  The imaging noted in the chart below indicated facet pathology. Accordingly they meet all the criteria and qualification for for radiofrequency ablation and we are going to complete this today hopefully for more longer term relief as part of comprehensive management program.  ROS Otherwise per HPI.  Assessment & Plan: Visit Diagnoses:    ICD-10-CM   1. Spondylosis without myelopathy or radiculopathy, lumbar region  M47.816 XR C-ARM NO REPORT    Radiofrequency,Lumbar    betamethasone acetate-betamethasone sodium phosphate (CELESTONE) injection 12 mg  2. Chronic bilateral low back pain without sciatica  M54.50 XR C-ARM NO REPORT   G89.29 Radiofrequency,Lumbar    betamethasone acetate-betamethasone sodium phosphate (CELESTONE) injection 12 mg    Plan: No additional findings.   Meds & Orders:  Meds ordered this encounter  Medications  . betamethasone acetate-betamethasone sodium phosphate (CELESTONE) injection 12 mg    Orders Placed This Encounter  Procedures  .  Radiofrequency,Lumbar  . XR C-ARM NO REPORT    Follow-up: Return if symptoms worsen or fail to improve.   Procedures: No procedures performed  Lumbar Facet Joint Nerve Denervation  Patient: Jessica Berger      Date of Birth: November 21, 1955 MRN: [id number] PCP: Brunetta Jeans, PA-C      Visit Date: 01/22/2020   Universal Protocol:    Date/Time: 12/22/213:45 PM  Consent Given By: the patient  Position: PRONE  Additional Comments: Vital signs were monitored before and after the procedure. Patient was prepped and draped in the usual sterile fashion. The correct patient, procedure, and site was verified.   Injection Procedure Details:   Procedure diagnoses:  1. Spondylosis without myelopathy or radiculopathy, lumbar region   2. Chronic bilateral low back pain without sciatica      Meds Administered:  Meds ordered this encounter  Medications  . betamethasone acetate-betamethasone sodium phosphate (CELESTONE) injection 12 mg     Laterality: Bilateral  Location/Site:  L5-S1, L4 medial branch and L5 dorsal ramus  Needle: 18 ga.,  32m active tip RF Cannula  Needle Placement: Along juncture of superior articular process and transverse pocess  Findings:  -Comments:  Procedure Details: For each desired target nerve, the corresponding transverse process (sacral ala for the L5 dorsal rami) was identified and the fluoroscope was positioned to square off the endplates of the corresponding vertebral body to achieve a true AP midline view.  The beam was then obliqued 15 to 20 degrees and caudally tilted 15 to 20 degrees to line up a trajectory along the target nerves. The  skin over the target of the junction of superior articulating process and transverse process (sacral ala for the L5 dorsal rami) was infiltrated with 39m of 1% Lidocaine without Epinephrine.  The 18 gauge 134mactive tip outer cannula was advanced in trajectory view to the target.  This procedure was repeated for  each target nerve.  Then, for all levels, the outer cannula placement was fine-tuned and the position was then confirmed with bi-planar imaging.    Test stimulation was done both at sensory and motor levels to ensure there was no radicular stimulation. The target tissues were then infiltrated with 1 ml of 1% Lidocaine without Epinephrine. Subsequently, a percutaneous neurotomy was carried out for 90 seconds at 80 degrees Celsius.  After the completion of the lesion, 1 ml of injectate was delivered. It was then repeated for each facet joint nerve mentioned above. Appropriate radiographs were obtained to verify the probe placement during the neurotomy.   Additional Comments:  The patient tolerated the procedure well Dressing: 2 x 2 sterile gauze and Band-Aid    Post-procedure details: Patient was observed during the procedure. Post-procedure instructions were reviewed.  Patient left the clinic in stable condition.       Clinical History: MRI LUMBAR SPINE WITHOUT CONTRAST  TECHNIQUE: Multiplanar, multisequence MR imaging of the lumbar spine was performed. No intravenous contrast was administered.  COMPARISON: Plain films lumbar spine 02/13/2019.  FINDINGS: Segmentation: Standard.  Alignment: Maintained.  Vertebrae: No fracture, evidence of discitis, or bone lesion.  Conus medullaris and cauda equina: Conus extends to the L1 level. Conus and cauda equina appear normal.  Paraspinal and other soft tissues: Negative.  Disc levels:  The T11-12 and T12-L1 are imaged in the sagittal plane only. Minimal disc bulging is present at these levels without stenosis.  L1-2: Minimal disc bulge without stenosis.  L2-3: Negative.  L3-4: There is loss of disc space height with a very shallow bulge. No stenosis.  L4-5: Negative.  L5-S1: Moderate facet arthropathy is worse on the right. No disc bulge or protrusion. No stenosis.  IMPRESSION: Mild lumbar degenerative disease without  central canal or foraminal stenosis. Moderate bilateral facet arthropathy at L5-S1 is worse on the right.   Electronically Signed  By: ThInge Rise.D.  On: 02/23/2019 17:31     Objective:  VS:  HT:    WT:   BMI:     BP:(!) 166/88  HR:68bpm  TEMP: ( )  RESP:  Physical Exam Constitutional:      General: She is not in acute distress.    Appearance: Normal appearance. She is obese. She is not ill-appearing.  HENT:     Head: Normocephalic and atraumatic.     Right Ear: External ear normal.     Left Ear: External ear normal.  Eyes:     Extraocular Movements: Extraocular movements intact.  Cardiovascular:     Rate and Rhythm: Normal rate.     Pulses: Normal pulses.  Musculoskeletal:     Right lower leg: No edema.     Left lower leg: No edema.     Comments: Patient has good distal strength with no pain over the greater trochanters.  No clonus or focal weakness. Patient somewhat slow to rise from a seated position to full extension.  There is concordant low back pain with facet loading and lumbar spine extension rotation.  There are no definitive trigger points but the patient is somewhat tender across the lower back and PSIS.  There is no pain  with hip rotation.   Skin:    Findings: No erythema, lesion or rash.  Neurological:     General: No focal deficit present.     Mental Status: She is alert and oriented to person, place, and time.     Sensory: No sensory deficit.     Motor: No weakness or abnormal muscle tone.     Coordination: Coordination normal.  Psychiatric:        Mood and Affect: Mood normal.        Behavior: Behavior normal.      Imaging: No results found.

## 2020-01-22 NOTE — Progress Notes (Signed)
Pt state lower back pain mostly her right side. Pt state walking, standing and bending makes the pain worse. Pt state she use heating pad, ice and pain meds  to help ease the pain. Pt has hx of inj on 7/12/21Pt state it was great and it lasted for six weeks.   Numeric Pain Rating Scale and Functional Assessment Average Pain 0   In the last MONTH (on 0-10 scale) has pain interfered with the following?  1. General activity like being  able to carry out your everyday physical activities such as walking, climbing stairs, carrying groceries, or moving a chair?  Rating(6)   +Driver, -BT, -Dye Allergies.

## 2020-01-22 NOTE — Procedures (Signed)
Lumbar Facet Joint Nerve Denervation  Patient: Jessica Berger      Date of Birth: 09-21-1955 MRN: [id number] PCP: Brunetta Jeans, PA-C      Visit Date: 01/22/2020   Universal Protocol:    Date/Time: 12/22/213:45 PM  Consent Given By: the patient  Position: PRONE  Additional Comments: Vital signs were monitored before and after the procedure. Patient was prepped and draped in the usual sterile fashion. The correct patient, procedure, and site was verified.   Injection Procedure Details:   Procedure diagnoses:  1. Spondylosis without myelopathy or radiculopathy, lumbar region   2. Chronic bilateral low back pain without sciatica      Meds Administered:  Meds ordered this encounter  Medications   betamethasone acetate-betamethasone sodium phosphate (CELESTONE) injection 12 mg     Laterality: Bilateral  Location/Site:  L5-S1, L4 medial branch and L5 dorsal ramus  Needle: 18 ga.,  36m active tip RF Cannula  Needle Placement: Along juncture of superior articular process and transverse pocess  Findings:  -Comments:  Procedure Details: For each desired target nerve, the corresponding transverse process (sacral ala for the L5 dorsal rami) was identified and the fluoroscope was positioned to square off the endplates of the corresponding vertebral body to achieve a true AP midline view.  The beam was then obliqued 15 to 20 degrees and caudally tilted 15 to 20 degrees to line up a trajectory along the target nerves. The skin over the target of the junction of superior articulating process and transverse process (sacral ala for the L5 dorsal rami) was infiltrated with 1108mof 1% Lidocaine without Epinephrine.  The 18 gauge 1062mctive tip outer cannula was advanced in trajectory view to the target.  This procedure was repeated for each target nerve.  Then, for all levels, the outer cannula placement was fine-tuned and the position was then confirmed with bi-planar imaging.     Test stimulation was done both at sensory and motor levels to ensure there was no radicular stimulation. The target tissues were then infiltrated with 1 ml of 1% Lidocaine without Epinephrine. Subsequently, a percutaneous neurotomy was carried out for 90 seconds at 80 degrees Celsius.  After the completion of the lesion, 1 ml of injectate was delivered. It was then repeated for each facet joint nerve mentioned above. Appropriate radiographs were obtained to verify the probe placement during the neurotomy.   Additional Comments:  The patient tolerated the procedure well Dressing: 2 x 2 sterile gauze and Band-Aid    Post-procedure details: Patient was observed during the procedure. Post-procedure instructions were reviewed.  Patient left the clinic in stable condition.

## 2020-02-11 DIAGNOSIS — F3132 Bipolar disorder, current episode depressed, moderate: Secondary | ICD-10-CM | POA: Diagnosis not present

## 2020-02-24 ENCOUNTER — Other Ambulatory Visit: Payer: Self-pay | Admitting: Physician Assistant

## 2020-02-26 ENCOUNTER — Encounter: Payer: Self-pay | Admitting: Physical Medicine and Rehabilitation

## 2020-02-28 ENCOUNTER — Telehealth: Payer: Self-pay

## 2020-02-28 DIAGNOSIS — E039 Hypothyroidism, unspecified: Secondary | ICD-10-CM

## 2020-02-28 MED ORDER — LEVOTHYROXINE SODIUM 112 MCG PO TABS: 112.0000 ug | ORAL_TABLET | Freq: Every day | ORAL | 1 refills | Status: DC

## 2020-02-28 NOTE — Telephone Encounter (Signed)
Rx sent to the preferred patient pharmacy

## 2020-02-28 NOTE — Telephone Encounter (Signed)
  LAST APPOINTMENT DATE: 07/02/2019   NEXT APPOINTMENT DATE:@Visit  date not found  MEDICATION:levothyroxine (SYNTHROID) 112 MCG tablet  PHARMACY: [id number]- [place], [place] - [address]  Comments: Patient is calling in stating she will be out of medication soon, has a TOC scheduled with Alyssa but will need the medicine before the appointment.   Please advise

## 2020-03-18 ENCOUNTER — Other Ambulatory Visit: Payer: Self-pay | Admitting: Physician Assistant

## 2020-03-18 ENCOUNTER — Other Ambulatory Visit: Payer: Self-pay | Admitting: Certified Nurse Midwife

## 2020-03-18 DIAGNOSIS — Z1231 Encounter for screening mammogram for malignant neoplasm of breast: Secondary | ICD-10-CM

## 2020-03-27 DIAGNOSIS — G4733 Obstructive sleep apnea (adult) (pediatric): Secondary | ICD-10-CM | POA: Diagnosis not present

## 2020-04-01 ENCOUNTER — Encounter: Payer: Self-pay | Admitting: Physical Medicine and Rehabilitation

## 2020-04-03 ENCOUNTER — Telehealth: Payer: Self-pay | Admitting: Physical Medicine and Rehabilitation

## 2020-04-03 NOTE — Telephone Encounter (Signed)
Called pt and LVM #1

## 2020-04-03 NOTE — Telephone Encounter (Signed)
Needs auth and scheduling for L5-S1 [place].

## 2020-04-03 NOTE — Telephone Encounter (Signed)
Pt not req Auth#. 

## 2020-04-06 ENCOUNTER — Ambulatory Visit: Payer: BC Managed Care – PPO | Admitting: Physician Assistant

## 2020-04-06 ENCOUNTER — Encounter: Payer: Self-pay | Admitting: Physician Assistant

## 2020-04-06 ENCOUNTER — Other Ambulatory Visit: Payer: Self-pay

## 2020-04-06 VITALS — BP 133/82 | HR 66 | Temp 98.0°F | Ht 66.0 in | Wt 249.0 lb

## 2020-04-06 DIAGNOSIS — E038 Other specified hypothyroidism: Secondary | ICD-10-CM

## 2020-04-06 NOTE — Patient Instructions (Signed)
Great to meet you today! Please go to the lab for a TSH and I will call with results then refill your Synthroid accordingly. See you back in one year for annual physical. Call sooner for any concerns!     Hypothyroidism  Hypothyroidism is when the thyroid gland does not make enough of certain hormones (it is underactive). The thyroid gland is a small gland located in the lower front part of the neck, just in front of the windpipe (trachea). This gland makes hormones that help control how the body uses food for energy (metabolism) as well as how the heart and brain function. These hormones also play a role in keeping your bones strong. When the thyroid is underactive, it produces too little of the hormones thyroxine (T4) and triiodothyronine (T3). What are the causes? This condition may be caused by:  Hashimoto's disease. This is a disease in which the body's disease-fighting system (immune system) attacks the thyroid gland. This is the most common cause.  Viral infections.  Pregnancy.  Certain medicines.  Birth defects.  Past radiation treatments to the head or neck for cancer.  Past treatment with radioactive iodine.  Past exposure to radiation in the environment.  Past surgical removal of part or all of the thyroid.  Problems with a gland in the center of the brain (pituitary gland).  Lack of enough iodine in the diet. What increases the risk? You are more likely to develop this condition if:  You are female.  You have a family history of thyroid conditions.  You use a medicine called lithium.  You take medicines that affect the immune system (immunosuppressants). What are the signs or symptoms? Symptoms of this condition include:  Feeling as though you have no energy (lethargy).  Not being able to tolerate cold.  Weight gain that is not explained by a change in diet or exercise habits.  Lack of appetite.  Dry skin.  Coarse hair.  Menstrual  irregularity.  Slowing of thought processes.  Constipation.  Sadness or depression. How is this diagnosed? This condition may be diagnosed based on:  Your symptoms, your medical history, and a physical exam.  Blood tests. You may also have imaging tests, such as an ultrasound or MRI. How is this treated? This condition is treated with medicine that replaces the thyroid hormones that your body does not make. After you begin treatment, it may take several weeks for symptoms to go away. Follow these instructions at home:  Take over-the-counter and prescription medicines only as told by your health care provider.  If you start taking any new medicines, tell your health care provider.  Keep all follow-up visits as told by your health care provider. This is important. ? As your condition improves, your dosage of thyroid hormone medicine may change. ? You will need to have blood tests regularly so that your health care provider can monitor your condition. Contact a health care provider if:  Your symptoms do not get better with treatment.  You are taking thyroid hormone replacement medicine and you: ? Sweat a lot. ? Have tremors. ? Feel anxious. ? Lose weight rapidly. ? Cannot tolerate heat. ? Have emotional swings. ? Have diarrhea. ? Feel weak. Get help right away if you have:  Chest pain.  An irregular heartbeat.  A rapid heartbeat.  Difficulty breathing. Summary  Hypothyroidism is when the thyroid gland does not make enough of certain hormones (it is underactive).  When the thyroid is underactive, it produces too little  of the hormones thyroxine (T4) and triiodothyronine (T3).  The most common cause is Hashimoto's disease, a disease in which the body's disease-fighting system (immune system) attacks the thyroid gland. The condition can also be caused by viral infections, medicine, pregnancy, or past radiation treatment to the head or neck.  Symptoms may include  weight gain, dry skin, constipation, feeling as though you do not have energy, and not being able to tolerate cold.  This condition is treated with medicine to replace the thyroid hormones that your body does not make. This information is not intended to replace advice given to you by your health care provider. Make sure you discuss any questions you have with your health care provider. Document Revised: 10/18/2019 Document Reviewed: 10/03/2019 Elsevier Patient Education  2021 [employer].

## 2020-04-06 NOTE — Telephone Encounter (Signed)
Called pt and LVM #2

## 2020-04-06 NOTE — Progress Notes (Signed)
Established Patient Office Visit  Subjective:  Patient ID: Jessica Berger, female    DOB: Sep 07, 1955  Age: 65 y.o. MRN: [id number]  HPI Jessica Berger presents for transfer of care from [facility], [place].  She is needing a refill on her Synthroid and also a recheck on her TSH as it has been over a year per patient.  She does not have any other acute concerns going on at this time.    She has multiple specialist that she sees for chronic ongoing issues including the following: Pauline Good is her psychiatrist (anxiety, bipolar 2, depression). Dr. Collene Mares is her GI (her father passed from colon cancer, pt has hx of UC) Dr. Joycelyn Rua (for GYN) Dr. Ernestina Patches (for her back issues -spondylosis).   Past Medical History:  Diagnosis Date  . Anemia, unspecified 04/18/2012  . Anxiety   . Bell's palsy   . Bipolar 2 disorder ([place])   . Depression   . History of chickenpox   . Hypercholesteremia   . Hypothyroidism 02/01/2012  . Ischemic colitis ([place]) 2010  . Menopause   . Migraine   . OSA on CPAP   . Ulcerative colitis ([place]) 02/01/2012    Past Surgical History:  Procedure Laterality Date  . CATARACT EXTRACTION W/ INTRAOCULAR LENS IMPLANT Left 03/14/2016  . CATARACT EXTRACTION W/ INTRAOCULAR LENS IMPLANT Right 04/18/2016  . NASAL SEPTUM SURGERY      Family History  Problem Relation Age of Onset  . Colon cancer Father 69       Colon  . Hypertension Father   . Breast cancer Maternal Grandmother   . Heart failure Mother   . Osteoarthritis Mother   . Depression Mother   . Hypertension Mother   . Stroke Mother   . Asthma Mother   . Cancer Paternal Grandmother        skin cancer, nose  . Heart attack Paternal Grandfather   . Cancer Maternal Grandfather        lung  . Angioedema Neg Hx   . Allergic rhinitis Neg Hx   . Eczema Neg Hx   . Urticaria Neg Hx     Social History   Socioeconomic History  . Marital status: Married    Spouse name: Not on file  . Number of children: 1  . Years  of education: BA  . Highest education level: Not on file  Occupational History  . Occupation: N/A  Tobacco Use  . Smoking status: Former Smoker    Quit date: 04/25/1991    Years since quitting: 28.9  . Smokeless tobacco: Never Used  Vaping Use  . Vaping Use: Never used  Substance and Sexual Activity  . Alcohol use: Yes    Alcohol/week: 6.0 standard drinks    Types: 6 Standard drinks or equivalent per week    Comment: alcohol about 6 drinks a week  . Drug use: No  . Sexual activity: Yes    Partners: Male    Birth control/protection: Post-menopausal, Other-see comments    Comment: husband vasectomy  Other Topics Concern  . Not on file  Social History Narrative   2 cups of coffee a day, occasional tea    Social Determinants of Health   Financial Resource Strain: Not on file  Food Insecurity: Not on file  Transportation Needs: Not on file  Physical Activity: Not on file  Stress: Not on file  Social Connections: Not on file  Intimate Partner Violence: Not on file    Outpatient  Medications Prior to Visit  Medication Sig Dispense Refill  . APPLE CIDER VINEGAR PO Take by mouth daily in the afternoon.    . Cholecalciferol (VITAMIN D PO) Take by mouth.    . clonazePAM (KLONOPIN) 0.5 MG tablet Take 1 tablet by mouth daily.    . fexofenadine (ALLEGRA) 180 MG tablet Take 180 mg by mouth daily.    . fluticasone (FLONASE) 50 MCG/ACT nasal spray Place into both nostrils daily.    Marland Kitchen lamoTRIgine (LAMICTAL) 100 MG tablet     . LATUDA 80 MG TABS tablet Take 1 tablet by mouth daily.    Marland Kitchen levothyroxine (SYNTHROID) 112 MCG tablet Take 1 tablet (112 mcg total) by mouth daily. 90 tablet 1  . NEXIUM 40 MG capsule Take 40 mg by mouth daily at 12 noon.     . propranolol (INDERAL) 20 MG tablet Take 1 tablet by mouth 2 (two) times daily.    . Rhubarb (ESTROVEN MENOPAUSE RELIEF PO) Take 1 tablet by mouth daily.    . TURMERIC CURCUMIN PO Take by mouth daily.    . vitamin B-12 (CYANOCOBALAMIN) 1000  MCG tablet Take 1,000 mcg by mouth daily.     No facility-administered medications prior to visit.    Allergies  Allergen Reactions  . Demerol [Meperidine]     migraines  . Penicillins Hives  . Sulfa Antibiotics Hives  . Xylocaine [Lidocaine] Other (See Comments)    Doesn't numb    ROS Review of Systems  Constitutional: Negative for activity change, appetite change, fever and unexpected weight change.  HENT: Negative for congestion.   Eyes: Negative for visual disturbance.  Respiratory: Negative for apnea, cough and shortness of breath.   Cardiovascular: Negative for chest pain, palpitations and leg swelling.  Gastrointestinal: Negative for abdominal pain, blood in stool, constipation and diarrhea.  Endocrine: Negative for polydipsia, polyphagia and polyuria.  Genitourinary: Negative for dysuria and pelvic pain.  Musculoskeletal: Positive for arthralgias and back pain.  Skin: Negative for rash.  Neurological: Negative for dizziness, weakness and headaches.  Hematological: Negative for adenopathy. Does not bruise/bleed easily.  Psychiatric/Behavioral: Negative for sleep disturbance and suicidal ideas. The patient is not nervous/anxious.       Objective:    Physical Exam Vitals and nursing note reviewed.  Constitutional:      Appearance: She is obese.  HENT:     Head: Normocephalic and atraumatic.     Right Ear: External ear normal.     Left Ear: External ear normal.     Nose: Nose normal.  Eyes:     Extraocular Movements: Extraocular movements intact.     Pupils: Pupils are equal, round, and reactive to light.  Neck:     Thyroid: No thyroid mass, thyromegaly or thyroid tenderness.  Cardiovascular:     Rate and Rhythm: Normal rate and regular rhythm.     Pulses: Normal pulses.     Heart sounds: No murmur heard.   Pulmonary:     Effort: Pulmonary effort is normal.     Breath sounds: Normal breath sounds.  Neurological:     General: No focal deficit present.      Mental Status: She is alert.  Psychiatric:        Mood and Affect: Mood normal.        Behavior: Behavior normal.     BP 133/82   Pulse 66   Temp 98 F (36.7 C)   Ht 5' 6"  (1.676 m)   Wt 249 lb (112.9  kg)   LMP 01/31/2002 (Approximate)   SpO2 97%   BMI 40.19 kg/m  Wt Readings from Last 3 Encounters:  04/06/20 249 lb (112.9 kg)  09/25/19 249 lb (112.9 kg)  07/17/19 248 lb (112.5 kg)     Health Maintenance Due  Topic Date Due  . DTAP VACCINES (1) 08/18/1955  . MAMMOGRAM  04/02/2020       Topic Date Due  . DTAP VACCINES (1) 08/18/1955    Lab Results  Component Value Date   TSH 1.79 12/05/2018   Lab Results  Component Value Date   WBC 5.3 12/05/2018   HGB 15.1 (H) 12/05/2018   HCT 44.2 12/05/2018   MCV 99.5 12/05/2018   PLT 255.0 12/05/2018   Lab Results  Component Value Date   NA 137 12/05/2018   K 4.3 12/05/2018   CO2 28 12/05/2018   GLUCOSE 114 (H) 12/05/2018   BUN 10 12/05/2018   CREATININE 0.87 12/05/2018   BILITOT 0.5 03/29/2019   ALKPHOS 63 03/29/2019   AST 12 03/29/2019   ALT 11 03/29/2019   PROT 6.8 03/29/2019   ALBUMIN 4.3 03/29/2019   CALCIUM 9.0 12/05/2018   GFR 65.88 12/05/2018   Lab Results  Component Value Date   CHOL 274 (H) 12/05/2018   Lab Results  Component Value Date   HDL 63.30 12/05/2018   Lab Results  Component Value Date   LDLCALC 172 (H) 12/05/2018   Lab Results  Component Value Date   TRIG 193.0 (H) 12/05/2018   Lab Results  Component Value Date   CHOLHDL 4 12/05/2018   Lab Results  Component Value Date   HGBA1C 5.2 12/05/2018      Assessment & Plan:   Problem List Items Addressed This Visit      Endocrine   Hypothyroidism - Primary   Relevant Orders   TSH    Other Visit Diagnoses    Morbid obesity ([place])          No orders of the defined types were placed in this encounter.   Follow-up: Return in about 1 year (around 04/06/2021) for CPE & labs .   1. Other specified hypothyroidism Will  check TSH today. If normal value, will refill her regular dose of synthroid 112 mcg through mail order pharmacy.  2. Morbid obesity ([place]) Encouraged working on healthy eating / exercise habits. Will discuss again in more detail at CPE.  This visit occurred during the SARS-CoV-2 public health emergency.  Safety protocols were in place, including screening questions prior to the visit, additional usage of staff PPE, and extensive cleaning of exam room while observing appropriate contact time as indicated for disinfecting solutions.   Kaida Games M Kamon Fahr, PA-C

## 2020-04-07 ENCOUNTER — Other Ambulatory Visit: Payer: Self-pay | Admitting: Physician Assistant

## 2020-04-07 ENCOUNTER — Encounter: Payer: Self-pay | Admitting: Physical Medicine and Rehabilitation

## 2020-04-07 DIAGNOSIS — E039 Hypothyroidism, unspecified: Secondary | ICD-10-CM

## 2020-04-07 LAB — TSH: TSH: 1.46 u[IU]/mL (ref 0.35–4.50)

## 2020-04-07 MED ORDER — LEVOTHYROXINE SODIUM 112 MCG PO TABS: 112.0000 ug | ORAL_TABLET | Freq: Every day | ORAL | 3 refills | Status: DC

## 2020-04-07 NOTE — Telephone Encounter (Signed)
Called pt and LVM #3

## 2020-04-08 NOTE — Telephone Encounter (Signed)
Called pt and LVM #4

## 2020-04-08 NOTE — Telephone Encounter (Signed)
Closing phone call.

## 2020-04-13 DIAGNOSIS — M545 Low back pain: Secondary | ICD-10-CM | POA: Diagnosis not present

## 2020-04-13 DIAGNOSIS — M542 Cervicalgia: Secondary | ICD-10-CM | POA: Diagnosis not present

## 2020-04-16 ENCOUNTER — Encounter: Payer: Self-pay | Admitting: Physical Medicine and Rehabilitation

## 2020-04-20 ENCOUNTER — Ambulatory Visit
Admission: RE | Admit: 2020-04-20 | Discharge: 2020-04-20 | Disposition: A | Discharge status: Home / Self Care | Payer: BC Managed Care – PPO | Source: Ambulatory Visit

## 2020-04-20 ENCOUNTER — Other Ambulatory Visit: Payer: Self-pay

## 2020-04-20 DIAGNOSIS — Z1231 Encounter for screening mammogram for malignant neoplasm of breast: Secondary | ICD-10-CM | POA: Diagnosis not present

## 2020-04-27 ENCOUNTER — Encounter: Payer: Self-pay | Admitting: Physical Medicine and Rehabilitation

## 2020-05-07 DIAGNOSIS — F3132 Bipolar disorder, current episode depressed, moderate: Secondary | ICD-10-CM | POA: Diagnosis not present

## 2020-05-11 ENCOUNTER — Other Ambulatory Visit: Payer: Self-pay

## 2020-05-11 ENCOUNTER — Ambulatory Visit (INDEPENDENT_AMBULATORY_CARE_PROVIDER_SITE_OTHER): Payer: BC Managed Care – PPO | Admitting: Physical Medicine and Rehabilitation

## 2020-05-11 ENCOUNTER — Ambulatory Visit: Payer: Self-pay

## 2020-05-11 ENCOUNTER — Encounter: Payer: Self-pay | Admitting: Physical Medicine and Rehabilitation

## 2020-05-11 VITALS — BP 151/85 | HR 61

## 2020-05-11 DIAGNOSIS — M5416 Radiculopathy, lumbar region: Secondary | ICD-10-CM

## 2020-05-11 MED ORDER — BETAMETHASONE SOD PHOS & ACET 6 (3-3) MG/ML IJ SUSP
12.0000 mg | Freq: Once | INTRAMUSCULAR | Status: AC
  Administered 2020-05-11: 12 mg

## 2020-05-11 NOTE — Progress Notes (Signed)
Pt state lower back pain that travels on both sides. Pt state walking, standing and bending makes the pain worse. Pt state she take over the counter pain meds and uses heating pads to to help ease pain. Pt has hx of RFA on 01/22/20  Pt state it helped for a few weeks.  Numeric Pain Rating Scale and Functional Assessment Average Pain 1   In the last MONTH (on 0-10 scale) has pain interfered with the following?  1. General activity like being  able to carry out your everyday physical activities such as walking, climbing stairs, carrying groceries, or moving a chair?  Rating(6)   +Driver, -BT, -Dye Allergies.

## 2020-05-11 NOTE — Patient Instructions (Signed)

## 2020-05-21 ENCOUNTER — Encounter: Payer: Self-pay | Admitting: Physical Medicine and Rehabilitation

## 2020-06-10 NOTE — Progress Notes (Signed)
Jessica Berger - 65 y.o. female MRN [id number]  Date of birth: April 20, 1955  Office Visit Note: Visit Date: 05/11/2020 PCP: Brunetta Jeans, PA-C Referred by: Brunetta Jeans, PA-C  Subjective: Chief Complaint  Patient presents with  . Lower Back - Pain   HPI:  Jessica Berger is a 65 y.o. female who comes in today  for planned midline L5-S1 Lumbar epidural steroid injection with fluoroscopic guidance.  The patient has failed conservative care including home exercise, medications, time and activity modification.  This injection will be diagnostic and hopefully therapeutic.  Please see requesting physician notes for further details and justification.  Through contact over the phone and MyChart it was determined that the patient did have some relief and she confirms that today with radiofrequency ablation but it just did not seem to last very long and her pain really is referral pain into the buttocks more.  We have decided today to complete a diagnostic epidural injection for some lateral recess narrowing but without high-grade stenosis.  We will see how she does from a relief standpoint and that we will help [place] gauge further treatment.   ROS Otherwise per HPI.  Assessment & Plan: Visit Diagnoses:    ICD-10-CM   1. Lumbar radiculopathy  M54.16 XR C-ARM NO REPORT    Epidural Steroid injection    betamethasone acetate-betamethasone sodium phosphate (CELESTONE) injection 12 mg    Plan: No additional findings.   Meds & Orders:  Meds ordered this encounter  Medications  . betamethasone acetate-betamethasone sodium phosphate (CELESTONE) injection 12 mg    Orders Placed This Encounter  Procedures  . XR C-ARM NO REPORT  . Epidural Steroid injection    Follow-up: Return if symptoms worsen or fail to improve.   Procedures: No procedures performed  Lumbar Epidural Steroid Injection - Interlaminar Approach with Fluoroscopic Guidance  Patient: Jessica Berger      Date of Birth:  07/19/1955 MRN: [id number] PCP: Brunetta Jeans, PA-C      Visit Date: 05/11/2020   Universal Protocol:     Consent Given By: the patient  Position: PRONE  Additional Comments: Vital signs were monitored before and after the procedure. Patient was prepped and draped in the usual sterile fashion. The correct patient, procedure, and site was verified.   Injection Procedure Details:   Procedure diagnoses: Lumbar radiculopathy [M54.16]   Meds Administered:  Meds ordered this encounter  Medications  . betamethasone acetate-betamethasone sodium phosphate (CELESTONE) injection 12 mg     Laterality: Right  Location/Site:  L5-S1  Needle: 4.5 in., 20 ga. Tuohy  Needle Placement: Paramedian epidural  Findings:   -Comments: Excellent flow of contrast into the epidural space.  Procedure Details: Using a paramedian approach from the side mentioned above, the region overlying the inferior lamina was localized under fluoroscopic visualization and the soft tissues overlying this structure were infiltrated with 4 ml. of 1% Lidocaine without Epinephrine. The Tuohy needle was inserted into the epidural space using a paramedian approach.   The epidural space was localized using loss of resistance along with counter oblique bi-planar fluoroscopic views.  After negative aspirate for air, blood, and CSF, a 2 ml. volume of Isovue-250 was injected into the epidural space and the flow of contrast was observed. Radiographs were obtained for documentation purposes.    The injectate was administered into the level noted above.   Additional Comments:  The patient tolerated the procedure well Dressing: 2 x 2 sterile gauze and Band-Aid  Post-procedure details: Patient was observed during the procedure. Post-procedure instructions were reviewed.  Patient left the clinic in stable condition.     Clinical History: MRI LUMBAR SPINE WITHOUT CONTRAST  TECHNIQUE: Multiplanar, multisequence  MR imaging of the lumbar spine was performed. No intravenous contrast was administered.  COMPARISON: Plain films lumbar spine 02/13/2019.  FINDINGS: Segmentation: Standard.  Alignment: Maintained.  Vertebrae: No fracture, evidence of discitis, or bone lesion.  Conus medullaris and cauda equina: Conus extends to the L1 level. Conus and cauda equina appear normal.  Paraspinal and other soft tissues: Negative.  Disc levels:  The T11-12 and T12-L1 are imaged in the sagittal plane only. Minimal disc bulging is present at these levels without stenosis.  L1-2: Minimal disc bulge without stenosis.  L2-3: Negative.  L3-4: There is loss of disc space height with a very shallow bulge. No stenosis.  L4-5: Negative.  L5-S1: Moderate facet arthropathy is worse on the right. No disc bulge or protrusion. No stenosis.  IMPRESSION: Mild lumbar degenerative disease without central canal or foraminal stenosis. Moderate bilateral facet arthropathy at L5-S1 is worse on the right.   Electronically Signed  By: Inge Rise M.D.  On: 02/23/2019 17:31     Objective:  VS:  HT:    WT:   BMI:     BP:(!) 151/85  HR:61bpm  TEMP: ( )  RESP:  Physical Exam Vitals and nursing note reviewed.  Constitutional:      General: She is not in acute distress.    Appearance: Normal appearance. She is obese. She is not ill-appearing.  HENT:     Head: Normocephalic and atraumatic.     Right Ear: External ear normal.     Left Ear: External ear normal.  Eyes:     Extraocular Movements: Extraocular movements intact.  Cardiovascular:     Rate and Rhythm: Normal rate.     Pulses: Normal pulses.  Pulmonary:     Effort: Pulmonary effort is normal. No respiratory distress.  Abdominal:     General: There is no distension.     Palpations: Abdomen is soft.  Musculoskeletal:        General: Tenderness present.     Cervical back: Neck supple.     Right lower leg: No edema.     Left lower leg: No  edema.     Comments: Patient has good distal strength with no pain over the greater trochanters.  No clonus or focal weakness.  Skin:    Findings: No erythema, lesion or rash.  Neurological:     General: No focal deficit present.     Mental Status: She is alert and oriented to person, place, and time.     Sensory: No sensory deficit.     Motor: No weakness or abnormal muscle tone.     Coordination: Coordination normal.  Psychiatric:        Mood and Affect: Mood normal.        Behavior: Behavior normal.      Imaging: No results found.

## 2020-06-10 NOTE — Procedures (Signed)
Lumbar Epidural Steroid Injection - Interlaminar Approach with Fluoroscopic Guidance  Patient: Jessica Berger      Date of Birth: 1955/12/08 MRN: [id number] PCP: Brunetta Jeans, PA-C      Visit Date: 05/11/2020   Universal Protocol:     Consent Given By: the patient  Position: PRONE  Additional Comments: Vital signs were monitored before and after the procedure. Patient was prepped and draped in the usual sterile fashion. The correct patient, procedure, and site was verified.   Injection Procedure Details:   Procedure diagnoses: Lumbar radiculopathy [M54.16]   Meds Administered:  Meds ordered this encounter  Medications  . betamethasone acetate-betamethasone sodium phosphate (CELESTONE) injection 12 mg     Laterality: Right  Location/Site:  L5-S1  Needle: 4.5 in., 20 ga. Tuohy  Needle Placement: Paramedian epidural  Findings:   -Comments: Excellent flow of contrast into the epidural space.  Procedure Details: Using a paramedian approach from the side mentioned above, the region overlying the inferior lamina was localized under fluoroscopic visualization and the soft tissues overlying this structure were infiltrated with 4 ml. of 1% Lidocaine without Epinephrine. The Tuohy needle was inserted into the epidural space using a paramedian approach.   The epidural space was localized using loss of resistance along with counter oblique bi-planar fluoroscopic views.  After negative aspirate for air, blood, and CSF, a 2 ml. volume of Isovue-250 was injected into the epidural space and the flow of contrast was observed. Radiographs were obtained for documentation purposes.    The injectate was administered into the level noted above.   Additional Comments:  The patient tolerated the procedure well Dressing: 2 x 2 sterile gauze and Band-Aid    Post-procedure details: Patient was observed during the procedure. Post-procedure instructions were reviewed.  Patient left  the clinic in stable condition.

## 2020-06-22 DIAGNOSIS — M545 Low back pain: Secondary | ICD-10-CM | POA: Diagnosis not present

## 2020-06-22 DIAGNOSIS — M542 Cervicalgia: Secondary | ICD-10-CM | POA: Diagnosis not present

## 2020-06-25 DIAGNOSIS — G4733 Obstructive sleep apnea (adult) (pediatric): Secondary | ICD-10-CM | POA: Diagnosis not present

## 2020-07-07 DIAGNOSIS — Z1211 Encounter for screening for malignant neoplasm of colon: Secondary | ICD-10-CM | POA: Diagnosis not present

## 2020-07-07 DIAGNOSIS — Z86010 Personal history of colonic polyps: Secondary | ICD-10-CM | POA: Diagnosis not present

## 2020-07-07 DIAGNOSIS — Z800 Family history of malignant neoplasm of digestive organs: Secondary | ICD-10-CM | POA: Diagnosis not present

## 2020-07-07 DIAGNOSIS — K219 Gastro-esophageal reflux disease without esophagitis: Secondary | ICD-10-CM | POA: Diagnosis not present

## 2020-07-11 ENCOUNTER — Encounter: Payer: Self-pay | Admitting: Physical Medicine and Rehabilitation

## 2020-07-13 ENCOUNTER — Encounter: Payer: Self-pay | Admitting: Physical Medicine and Rehabilitation

## 2020-07-13 DIAGNOSIS — M542 Cervicalgia: Secondary | ICD-10-CM | POA: Diagnosis not present

## 2020-07-13 DIAGNOSIS — M545 Low back pain: Secondary | ICD-10-CM | POA: Diagnosis not present

## 2020-07-13 MED ORDER — MELOXICAM 15 MG PO TABS: 15.0000 mg | ORAL_TABLET | Freq: Every day | ORAL | 0 refills | Status: DC

## 2020-07-13 NOTE — Telephone Encounter (Signed)
Left message #1

## 2020-07-16 ENCOUNTER — Encounter: Payer: Self-pay | Admitting: Physical Medicine and Rehabilitation

## 2020-07-22 ENCOUNTER — Encounter: Payer: Self-pay | Admitting: Physical Medicine and Rehabilitation

## 2020-07-22 ENCOUNTER — Ambulatory Visit: Payer: Self-pay

## 2020-07-22 ENCOUNTER — Ambulatory Visit: Payer: BC Managed Care – PPO | Admitting: Physical Medicine and Rehabilitation

## 2020-07-22 ENCOUNTER — Other Ambulatory Visit: Payer: Self-pay

## 2020-07-22 VITALS — BP 133/84 | HR 58

## 2020-07-22 DIAGNOSIS — M5416 Radiculopathy, lumbar region: Secondary | ICD-10-CM | POA: Diagnosis not present

## 2020-07-22 MED ORDER — BETAMETHASONE SOD PHOS & ACET 6 (3-3) MG/ML IJ SUSP
12.0000 mg | Freq: Once | INTRAMUSCULAR | Status: AC
  Administered 2020-07-22: 12 mg

## 2020-07-22 NOTE — Progress Notes (Signed)
Pt state lower back pain mostly on her right side. Pt state walking, standing and bending makes the pain worse. Pt state she takes pain meds and uses ice to help ease her pain. Pt has hx of inj on 05/11/20 pt state it helped but didn't go way much.  Numeric Pain Rating Scale and Functional Assessment Average Pain 2   In the last MONTH (on 0-10 scale) has pain interfered with the following?  1. General activity like being  able to carry out your everyday physical activities such as walking, climbing stairs, carrying groceries, or moving a chair?  Rating(6)   +Driver, -BT, -Dye Allergies.

## 2020-07-22 NOTE — Patient Instructions (Signed)

## 2020-07-26 NOTE — Progress Notes (Signed)
Jessica Berger - 65 y.o. female MRN [id number]  Date of birth: 08/10/1955  Office Visit Note: Visit Date: 07/22/2020 PCP: Fredirick Lathe, PA-C Referred by: Allwardt, Randa Evens, PA-C  Subjective: Chief Complaint  Patient presents with   Lower Back - Pain   HPI:  Jessica Berger is a 65 y.o. female who comes in today At my request for planned repeat right L5-S1 interlaminar epidural steroid injection.  Patient had prior injection in April actually for about 3 weeks gave her a great amount of relief probably 80%.  She had continued relief after that to a degree just not nearly what she would like to have had but it did sustain her relief to some degree for quite a while.  She still gets a catching sensation with standing and rotating and lying and rotating.  She has been in active physical therapy with Kym Groom.  I feel like this is still probably somewhat facet joint mediated and mechanical.  She did much better with intra-articular facet block than she did with radiofrequency ablation.  Depending on relief today, she does have a trip upcoming to [place] where she is going to be in the car for a while, I would look at potentially repeating the facet ablation and I would likely do that at no charge.  Other interventional approach would be potential for intra-articular block again.  Intra-articular facet block gave her 6 weeks of almost 100% relief.  ROS Otherwise per HPI.  Assessment & Plan: Visit Diagnoses:    ICD-10-CM   1. Lumbar radiculopathy  M54.16 XR C-ARM NO REPORT    Epidural Steroid injection    betamethasone acetate-betamethasone sodium phosphate (CELESTONE) injection 12 mg      Plan: No additional findings.   Meds & Orders:  Meds ordered this encounter  Medications   betamethasone acetate-betamethasone sodium phosphate (CELESTONE) injection 12 mg    Orders Placed This Encounter  Procedures   XR C-ARM NO REPORT   Epidural Steroid injection    Follow-up:  Return if symptoms worsen or fail to improve.   Procedures: No procedures performed  Lumbar Epidural Steroid Injection - Interlaminar Approach with Fluoroscopic Guidance  Patient: Jessica Berger      Date of Birth: Oct 24, 1955 MRN: [id number] PCP: Fredirick Lathe, PA-C      Visit Date: 07/22/2020   Universal Protocol:     Consent Given By: the patient  Position: PRONE  Additional Comments: Vital signs were monitored before and after the procedure. Patient was prepped and draped in the usual sterile fashion. The correct patient, procedure, and site was verified.   Injection Procedure Details:   Procedure diagnoses: Lumbar radiculopathy [M54.16]   Meds Administered:  Meds ordered this encounter  Medications   betamethasone acetate-betamethasone sodium phosphate (CELESTONE) injection 12 mg     Laterality: Right  Location/Site:  L5-S1  Needle: 4.5 in., 20 ga. Tuohy  Needle Placement: Paramedian epidural  Findings:   -Comments: Excellent flow of contrast into the epidural space.  Procedure Details: Using a paramedian approach from the side mentioned above, the region overlying the inferior lamina was localized under fluoroscopic visualization and the soft tissues overlying this structure were infiltrated with 4 ml. of 1% Lidocaine without Epinephrine. The Tuohy needle was inserted into the epidural space using a paramedian approach.   The epidural space was localized using loss of resistance along with counter oblique bi-planar fluoroscopic views.  After negative aspirate for air, blood, and CSF, a 2  ml. volume of Isovue-250 was injected into the epidural space and the flow of contrast was observed. Radiographs were obtained for documentation purposes.    The injectate was administered into the level noted above.   Additional Comments:  The patient tolerated the procedure well Dressing: 2 x 2 sterile gauze and Band-Aid    Post-procedure details: Patient was  observed during the procedure. Post-procedure instructions were reviewed.  Patient left the clinic in stable condition.   Clinical History: MRI LUMBAR SPINE WITHOUT CONTRAST  TECHNIQUE: Multiplanar, multisequence MR imaging of the lumbar spine was performed. No intravenous contrast was administered.  COMPARISON: Plain films lumbar spine 02/13/2019.  FINDINGS: Segmentation: Standard.  Alignment: Maintained.  Vertebrae: No fracture, evidence of discitis, or bone lesion.  Conus medullaris and cauda equina: Conus extends to the L1 level. Conus and cauda equina appear normal.  Paraspinal and other soft tissues: Negative.  Disc levels:  The T11-12 and T12-L1 are imaged in the sagittal plane only. Minimal disc bulging is present at these levels without stenosis.  L1-2: Minimal disc bulge without stenosis.  L2-3: Negative.  L3-4: There is loss of disc space height with a very shallow bulge. No stenosis.  L4-5: Negative.  L5-S1: Moderate facet arthropathy is worse on the right. No disc bulge or protrusion. No stenosis.  IMPRESSION: Mild lumbar degenerative disease without central canal or foraminal stenosis. Moderate bilateral facet arthropathy at L5-S1 is worse on the right.   Electronically Signed  By: Inge Rise M.D.  On: 02/23/2019 17:31     Objective:  VS:  HT:    WT:   BMI:     BP:133/84  HR:(!) 58bpm  TEMP: ( )  RESP:  Physical Exam Vitals and nursing note reviewed.  Constitutional:      General: She is not in acute distress.    Appearance: Normal appearance. She is obese. She is not ill-appearing.  HENT:     Head: Normocephalic and atraumatic.     Right Ear: External ear normal.     Left Ear: External ear normal.  Eyes:     Extraocular Movements: Extraocular movements intact.  Cardiovascular:     Rate and Rhythm: Normal rate.     Pulses: Normal pulses.  Pulmonary:     Effort: Pulmonary effort is normal. No respiratory distress.   Abdominal:     General: There is no distension.     Palpations: Abdomen is soft.  Musculoskeletal:        General: Tenderness present.     Cervical back: Neck supple.     Right lower leg: No edema.     Left lower leg: No edema.     Comments: Patient has good distal strength with no pain over the greater trochanters.  No clonus or focal weakness.  Skin:    Findings: No erythema, lesion or rash.  Neurological:     General: No focal deficit present.     Mental Status: She is alert and oriented to person, place, and time.     Sensory: No sensory deficit.     Motor: No weakness or abnormal muscle tone.     Coordination: Coordination normal.  Psychiatric:        Mood and Affect: Mood normal.        Behavior: Behavior normal.     Imaging: No results found.

## 2020-07-26 NOTE — Procedures (Signed)
Lumbar Epidural Steroid Injection - Interlaminar Approach with Fluoroscopic Guidance  Patient: Jessica Berger      Date of Birth: 08-31-1955 MRN: [id number] PCP: Fredirick Lathe, PA-C      Visit Date: 07/22/2020   Universal Protocol:     Consent Given By: the patient  Position: PRONE  Additional Comments: Vital signs were monitored before and after the procedure. Patient was prepped and draped in the usual sterile fashion. The correct patient, procedure, and site was verified.   Injection Procedure Details:   Procedure diagnoses: Lumbar radiculopathy [M54.16]   Meds Administered:  Meds ordered this encounter  Medications   betamethasone acetate-betamethasone sodium phosphate (CELESTONE) injection 12 mg     Laterality: Right  Location/Site:  L5-S1  Needle: 4.5 in., 20 ga. Tuohy  Needle Placement: Paramedian epidural  Findings:   -Comments: Excellent flow of contrast into the epidural space.  Procedure Details: Using a paramedian approach from the side mentioned above, the region overlying the inferior lamina was localized under fluoroscopic visualization and the soft tissues overlying this structure were infiltrated with 4 ml. of 1% Lidocaine without Epinephrine. The Tuohy needle was inserted into the epidural space using a paramedian approach.   The epidural space was localized using loss of resistance along with counter oblique bi-planar fluoroscopic views.  After negative aspirate for air, blood, and CSF, a 2 ml. volume of Isovue-250 was injected into the epidural space and the flow of contrast was observed. Radiographs were obtained for documentation purposes.    The injectate was administered into the level noted above.   Additional Comments:  The patient tolerated the procedure well Dressing: 2 x 2 sterile gauze and Band-Aid    Post-procedure details: Patient was observed during the procedure. Post-procedure instructions were reviewed.  Patient left  the clinic in stable condition.

## 2020-08-06 DIAGNOSIS — F3132 Bipolar disorder, current episode depressed, moderate: Secondary | ICD-10-CM | POA: Diagnosis not present

## 2020-08-10 ENCOUNTER — Encounter: Payer: Self-pay | Admitting: Family

## 2020-08-10 ENCOUNTER — Telehealth (INDEPENDENT_AMBULATORY_CARE_PROVIDER_SITE_OTHER): Payer: BC Managed Care – PPO | Admitting: Family

## 2020-08-10 ENCOUNTER — Other Ambulatory Visit: Payer: Self-pay

## 2020-08-10 VITALS — Ht 66.0 in | Wt 248.9 lb

## 2020-08-10 DIAGNOSIS — R059 Cough, unspecified: Secondary | ICD-10-CM

## 2020-08-10 DIAGNOSIS — U071 COVID-19: Secondary | ICD-10-CM | POA: Diagnosis not present

## 2020-08-10 MED ORDER — PROMETHAZINE-DM 6.25-15 MG/5ML PO SYRP: 5.0000 mL | ORAL_SOLUTION | Freq: Four times a day (QID) | ORAL | 0 refills | Status: DC | PRN

## 2020-08-10 NOTE — Progress Notes (Signed)
Virtual Visit via Video   I connected with patient on 08/10/20 at  9:45 AM EDT by a video enabled telemedicine application and verified that I am speaking with the correct person using two identifiers.  Location patient: Home Location provider: [place], Office Persons participating in the virtual visit: Patient, Provider, CMA Lurene Shadow  I discussed the limitations of evaluation and management by telemedicine and the availability of in person appointments. The patient expressed understanding and agreed to proceed.  Subjective:   HPI:   65 year old female presents via video visit with concerns of cough, runny nose and congestion that started July 3.  She has taken 3 COVID test prior to yesterday that were all negative.  However, yesterday she was positive.  Reports having sore throat, headache and hoarseness.  Unable to sleep due to coughing.  Reports taking Mucinex DM that she feels made her lightheaded to the point of "passing out "for a few minutes yesterday while sitting on the toilet.  Her husband is home with her.  She recently traveled to [facility].  She is vaccinated and boosted  ROS:   See pertinent positives and negatives per HPI.  Patient Active Problem List   Diagnosis Date Noted   Seasonal and perennial allergic rhinitis 05/18/2017   Easy bruising 07/18/2016   Hypothyroidism 07/15/2016   Bipolar 2 disorder ([place])    Ischemic colitis ([place])    Anemia, unspecified 04/18/2012   Ulcerative colitis ([place]) 02/01/2012    Social History   Tobacco Use   Smoking status: Former    Pack years: 0.00    Types: Cigarettes    Quit date: 04/25/1991    Years since quitting: 29.3   Smokeless tobacco: Never  Substance Use Topics   Alcohol use: Yes    Alcohol/week: 6.0 standard drinks    Types: 6 Standard drinks or equivalent per week    Comment: alcohol about 6 drinks a week    Current Outpatient Medications:    APPLE CIDER VINEGAR PO, Take by mouth  daily in the afternoon., Disp: , Rfl:    Cholecalciferol (VITAMIN D PO), Take by mouth., Disp: , Rfl:    clonazePAM (KLONOPIN) 0.5 MG tablet, Take 1 tablet by mouth daily., Disp: , Rfl:    fexofenadine (ALLEGRA) 180 MG tablet, Take 180 mg by mouth daily., Disp: , Rfl:    fluticasone (FLONASE) 50 MCG/ACT nasal spray, Place into both nostrils daily., Disp: , Rfl:    lamoTRIgine (LAMICTAL) 100 MG tablet, , Disp: , Rfl:    LATUDA 80 MG TABS tablet, Take 1 tablet by mouth daily., Disp: , Rfl:    levothyroxine (SYNTHROID) 112 MCG tablet, Take 1 tablet (112 mcg total) by mouth daily., Disp: 90 tablet, Rfl: 3   meloxicam (MOBIC) 15 MG tablet, Take 1 tablet (15 mg total) by mouth daily. Take with food, Disp: 30 tablet, Rfl: 0   NEXIUM 40 MG capsule, Take 40 mg by mouth daily at 12 noon. , Disp: , Rfl:    promethazine-dextromethorphan (PROMETHAZINE-DM) 6.25-15 MG/5ML syrup, Take 5 mLs by mouth 4 (four) times daily as needed for cough., Disp: 118 mL, Rfl: 0   propranolol (INDERAL) 20 MG tablet, Take 1 tablet by mouth 2 (two) times daily., Disp: , Rfl:    Rhubarb (ESTROVEN MENOPAUSE RELIEF PO), Take 1 tablet by mouth daily., Disp: , Rfl:    TURMERIC CURCUMIN PO, Take by mouth daily., Disp: , Rfl:    vitamin B-12 (CYANOCOBALAMIN) 1000 MCG  tablet, Take 1,000 mcg by mouth daily., Disp: , Rfl:   Allergies  Allergen Reactions   Demerol [Meperidine]     migraines   Penicillins Hives   Sulfa Antibiotics Hives   Xylocaine [Lidocaine] Other (See Comments)    Doesn't numb    Objective:   Ht 5' 6"  (1.676 m)   Wt 248 lb 14.4 oz (112.9 kg)   LMP 01/31/2002 (Approximate)   BMI 40.17 kg/m   Patient is well-developed, well-nourished in no acute distress.  Resting comfortably at home.  Head is normocephalic, atraumatic.  No labored breathing.  Speech is clear and coherent with logical content.  Patient is alert and oriented at baseline.  Blood pressure  Assessment and Plan:  Jessica Berger was seen today for  covid positive, sore throat and cough.  Diagnoses and all orders for this visit:  COVID-19  Cough  Other orders -     promethazine-dextromethorphan (PROMETHAZINE-DM) 6.25-15 MG/5ML syrup; Take 5 mLs by mouth 4 (four) times daily as needed for cough.   Call the office if symptoms worsen or persist.  Recheck as scheduled and sooner as needed.  Isolation precautions discussed.  Kennyth Arnold, FNP 08/10/2020

## 2020-08-18 ENCOUNTER — Encounter: Payer: Self-pay | Admitting: Adult Health

## 2020-08-18 ENCOUNTER — Telehealth: Payer: Self-pay | Admitting: Adult Health

## 2020-08-18 ENCOUNTER — Telehealth: Payer: BC Managed Care – PPO | Admitting: Adult Health

## 2020-08-18 NOTE — Telephone Encounter (Signed)
Pt called 30 minutes after scheduled MyChart appt at 1:45p. Pt stated, have been waiting for 30 minutes. Informed her if no one on schedule time call 1 to 5 minutes into visit. Pt verbalized understand Rescheduled appt 01/19/21 at 11:30a.

## 2020-08-31 DIAGNOSIS — D692 Other nonthrombocytopenic purpura: Secondary | ICD-10-CM | POA: Diagnosis not present

## 2020-08-31 DIAGNOSIS — D2261 Melanocytic nevi of right upper limb, including shoulder: Secondary | ICD-10-CM | POA: Diagnosis not present

## 2020-08-31 DIAGNOSIS — D485 Neoplasm of uncertain behavior of skin: Secondary | ICD-10-CM | POA: Diagnosis not present

## 2020-08-31 DIAGNOSIS — L820 Inflamed seborrheic keratosis: Secondary | ICD-10-CM | POA: Diagnosis not present

## 2020-08-31 DIAGNOSIS — D1801 Hemangioma of skin and subcutaneous tissue: Secondary | ICD-10-CM | POA: Diagnosis not present

## 2020-08-31 DIAGNOSIS — D225 Melanocytic nevi of trunk: Secondary | ICD-10-CM | POA: Diagnosis not present

## 2020-09-14 DIAGNOSIS — H5319 Other subjective visual disturbances: Secondary | ICD-10-CM | POA: Diagnosis not present

## 2020-09-28 ENCOUNTER — Encounter: Payer: Self-pay | Admitting: Obstetrics and Gynecology

## 2020-09-28 ENCOUNTER — Ambulatory Visit (INDEPENDENT_AMBULATORY_CARE_PROVIDER_SITE_OTHER): Payer: BC Managed Care – PPO | Admitting: Obstetrics and Gynecology

## 2020-09-28 ENCOUNTER — Other Ambulatory Visit: Payer: Self-pay

## 2020-09-28 VITALS — BP 124/72 | HR 66 | Ht 66.5 in | Wt 236.0 lb

## 2020-09-28 DIAGNOSIS — K449 Diaphragmatic hernia without obstruction or gangrene: Secondary | ICD-10-CM | POA: Insufficient documentation

## 2020-09-28 DIAGNOSIS — Z01419 Encounter for gynecological examination (general) (routine) without abnormal findings: Secondary | ICD-10-CM

## 2020-09-28 DIAGNOSIS — Z860100 Personal history of colon polyps, unspecified: Secondary | ICD-10-CM | POA: Insufficient documentation

## 2020-09-28 DIAGNOSIS — Z800 Family history of malignant neoplasm of digestive organs: Secondary | ICD-10-CM | POA: Insufficient documentation

## 2020-09-28 DIAGNOSIS — R194 Change in bowel habit: Secondary | ICD-10-CM | POA: Insufficient documentation

## 2020-09-28 DIAGNOSIS — Z1211 Encounter for screening for malignant neoplasm of colon: Secondary | ICD-10-CM | POA: Insufficient documentation

## 2020-09-28 DIAGNOSIS — R1033 Periumbilical pain: Secondary | ICD-10-CM | POA: Insufficient documentation

## 2020-09-28 DIAGNOSIS — K760 Fatty (change of) liver, not elsewhere classified: Secondary | ICD-10-CM | POA: Insufficient documentation

## 2020-09-28 DIAGNOSIS — K219 Gastro-esophageal reflux disease without esophagitis: Secondary | ICD-10-CM | POA: Insufficient documentation

## 2020-09-28 DIAGNOSIS — Z0000 Encounter for general adult medical examination without abnormal findings: Secondary | ICD-10-CM | POA: Insufficient documentation

## 2020-09-28 DIAGNOSIS — R140 Abdominal distension (gaseous): Secondary | ICD-10-CM | POA: Insufficient documentation

## 2020-09-28 DIAGNOSIS — K580 Irritable bowel syndrome with diarrhea: Secondary | ICD-10-CM | POA: Insufficient documentation

## 2020-09-28 NOTE — Progress Notes (Signed)
65 y.o. G20P1001 Married White or Caucasian Not Hispanic or Latino female here for annual exam.  Vaginal bleeding. Sexually active no pain.     Patient's last menstrual period was 01/31/2002 (approximate).          Sexually active: Yes.    The current method of family planning is post menopausal status.    Exercising: Yes.     Walking and dvd workout  Smoker:  no  Health Maintenance: Pap:  09/04/18 Wnl Hr HPV Neg 05/12/15 Neg Hr HPV Neg  History of abnormal Pap:  no MMG:  04/20/20 density B Bi-rads 1 neg  BMD:   2018 normal  Colonoscopy: 2019 follow up 3 years, scheduled next week  TDaP:  2014 Gardasil: none    reports that she quit smoking about 29 years ago. Her smoking use included cigarettes. She has never used smokeless tobacco. She reports current alcohol use of about 6.0 standard drinks per week. She reports that she does not use drugs.  Daughter in [place], no grandchildren. Retired Agricultural engineer.   Past Medical History:  Diagnosis Date   Anemia, unspecified 04/18/2012   Anxiety    Bell's palsy    Bipolar 2 disorder ([place])    Depression    History of chickenpox    Hypercholesteremia    Hypothyroidism 02/01/2012   Ischemic colitis ([place]) 2010   Menopause    Migraine    OSA on CPAP    Ulcerative colitis ([place]) 02/01/2012    Past Surgical History:  Procedure Laterality Date   CATARACT EXTRACTION W/ INTRAOCULAR LENS IMPLANT Left 03/14/2016   CATARACT EXTRACTION W/ INTRAOCULAR LENS IMPLANT Right 04/18/2016   NASAL SEPTUM SURGERY      Current Outpatient Medications  Medication Sig Dispense Refill   APPLE CIDER VINEGAR PO Take by mouth daily in the afternoon.     Cholecalciferol (VITAMIN D PO) Take by mouth.     clonazePAM (KLONOPIN) 0.5 MG tablet Take 1 tablet by mouth daily.     fexofenadine (ALLEGRA) 180 MG tablet Take 180 mg by mouth daily.     fluticasone (FLONASE) 50 MCG/ACT nasal spray Place into both nostrils daily.     lamoTRIgine (LAMICTAL) 100 MG tablet      LATUDA 80  MG TABS tablet Take 1 tablet by mouth daily.     levothyroxine (SYNTHROID) 112 MCG tablet Take 1 tablet (112 mcg total) by mouth daily. 90 tablet 3   meloxicam (MOBIC) 15 MG tablet Take 1 tablet (15 mg total) by mouth daily. Take with food 30 tablet 0   NEXIUM 40 MG capsule Take 40 mg by mouth daily at 12 noon.      propranolol (INDERAL) 20 MG tablet Take 1 tablet by mouth 2 (two) times daily.     Rhubarb (ESTROVEN MENOPAUSE RELIEF PO) Take 1 tablet by mouth daily.     TURMERIC CURCUMIN PO Take by mouth daily.     vitamin B-12 (CYANOCOBALAMIN) 1000 MCG tablet Take 1,000 mcg by mouth daily.     No current facility-administered medications for this visit.    Family History  Problem Relation Age of Onset   Colon cancer Father 27       Colon   Hypertension Father    Breast cancer Maternal Grandmother    Heart failure Mother    Osteoarthritis Mother    Depression Mother    Hypertension Mother    Stroke Mother    Asthma Mother    Cancer Paternal Grandmother  skin cancer, nose   Heart attack Paternal Grandfather    Cancer Maternal Grandfather        lung   Angioedema Neg Hx    Allergic rhinitis Neg Hx    Eczema Neg Hx    Urticaria Neg Hx     Review of Systems  Psychiatric/Behavioral:  Positive for dysphoric mood. The patient is nervous/anxious.   All other systems reviewed and are negative. The anxiety started with Covid and needing to wear a mask. 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 She is much better.   Exam:   BP 124/72   Pulse 66   Ht 5' 6.5" (1.689 m)   Wt 236 lb (107 kg)   LMP 01/31/2002 (Approximate)   SpO2 98%   BMI 37.52 kg/m   Weight change: @WEIGHTCHANGE @ Height:   Height: 5' 6.5" (168.9 cm)  Ht Readings from Last 3 Encounters:  09/28/20 5' 6.5" (1.689 m)  08/10/20 5' 6"  (1.676 m)  04/06/20 5' 6"  (1.676 m)    General appearance: alert, cooperative and appears stated age Head: Normocephalic, without obvious abnormality, atraumatic Neck: no adenopathy,  supple, symmetrical, trachea midline and thyroid normal to inspection and palpation Lungs: clear to auscultation bilaterally Cardiovascular: regular rate and rhythm Breasts: normal appearance, no masses or tenderness Abdomen: soft, non-tender; non distended,  no masses,  no organomegaly Extremities: extremities normal, atraumatic, no cyanosis or edema Skin: Skin color, texture, turgor normal. No rashes or lesions Lymph nodes: Cervical, supraclavicular, and axillary nodes normal. No abnormal inguinal nodes palpated Neurologic: Grossly normal   Pelvic: External genitalia:  no lesions              Urethra:  normal appearing urethra with no masses, tenderness or lesions              Bartholins and Skenes: normal                 Vagina: normal appearing vagina with normal color and discharge, no lesions              Cervix: no lesions               Bimanual Exam:  Uterus:   no masses or tenderness              Adnexa: no mass, fullness, tenderness               Rectovaginal: Confirms               Anus:  normal sphincter tone, no lesions  Glorianne Manchester, RN chaperoned for the exam.  1. Well woman exam No pap this year Labs with Dr Collene Mares Colonoscopy next week Mammogram UTD Discussed breast self exam Discussed calcium and vit D intake

## 2020-09-28 NOTE — Patient Instructions (Signed)

## 2020-09-30 DIAGNOSIS — G4733 Obstructive sleep apnea (adult) (pediatric): Secondary | ICD-10-CM | POA: Diagnosis not present

## 2020-10-09 DIAGNOSIS — K635 Polyp of colon: Secondary | ICD-10-CM | POA: Diagnosis not present

## 2020-10-09 DIAGNOSIS — Z1211 Encounter for screening for malignant neoplasm of colon: Secondary | ICD-10-CM | POA: Diagnosis not present

## 2020-10-09 DIAGNOSIS — Z800 Family history of malignant neoplasm of digestive organs: Secondary | ICD-10-CM | POA: Diagnosis not present

## 2020-10-09 DIAGNOSIS — D120 Benign neoplasm of cecum: Secondary | ICD-10-CM | POA: Diagnosis not present

## 2020-10-09 DIAGNOSIS — D123 Benign neoplasm of transverse colon: Secondary | ICD-10-CM | POA: Diagnosis not present

## 2020-11-05 ENCOUNTER — Other Ambulatory Visit: Payer: Self-pay | Admitting: Physical Medicine and Rehabilitation

## 2020-11-05 DIAGNOSIS — F3132 Bipolar disorder, current episode depressed, moderate: Secondary | ICD-10-CM | POA: Diagnosis not present

## 2020-11-05 NOTE — Telephone Encounter (Signed)
Please advise 

## 2020-11-23 ENCOUNTER — Telehealth: Payer: Self-pay

## 2020-11-23 NOTE — Telephone Encounter (Signed)
Pt called requesting a referral to a Podiatrist. Pt wants to know if she can be referred without an OV. Please Advise.

## 2020-11-24 ENCOUNTER — Other Ambulatory Visit: Payer: Self-pay

## 2020-11-24 DIAGNOSIS — M79676 Pain in unspecified toe(s): Secondary | ICD-10-CM

## 2020-11-24 NOTE — Telephone Encounter (Signed)
Spoke with patient referral placed

## 2020-12-02 ENCOUNTER — Other Ambulatory Visit: Payer: Self-pay

## 2020-12-02 ENCOUNTER — Ambulatory Visit: Payer: BC Managed Care – PPO | Admitting: Podiatry

## 2020-12-02 DIAGNOSIS — L600 Ingrowing nail: Secondary | ICD-10-CM

## 2020-12-03 ENCOUNTER — Encounter: Payer: Self-pay | Admitting: Podiatry

## 2020-12-03 NOTE — Progress Notes (Signed)
Subjective:  Patient ID: Jessica Berger, female    DOB: 1955-04-13,  MRN: [id number]  Chief Complaint  Patient presents with   Nail Problem    Nail discoloration     65 y.o. female presents with the above complaint.  Patient presents with right hallux lateral border ingrown.  Patient states is painful to touch.  Patient would like to have it removed.  She states been causing a lot of pain with ambulation.  She denies any other acute complaints.  Her pain scale is 5 out of 10 hurts with pressure.   Review of Systems: Negative except as noted in the HPI. Denies N/V/F/Ch.  Past Medical History:  Diagnosis Date   Anemia, unspecified 04/18/2012   Anxiety    Bell's palsy    Bipolar 2 disorder ([place])    Depression    History of chickenpox    Hypercholesteremia    Hypothyroidism 02/01/2012   Ischemic colitis ([place]) 2010   Menopause    Migraine    OSA on CPAP    Ulcerative colitis ([place]) 02/01/2012    Current Outpatient Medications:    APPLE CIDER VINEGAR PO, Take by mouth daily in the afternoon., Disp: , Rfl:    Cholecalciferol (VITAMIN D PO), Take by mouth., Disp: , Rfl:    clonazePAM (KLONOPIN) 0.5 MG tablet, Take 1 tablet by mouth daily., Disp: , Rfl:    fexofenadine (ALLEGRA) 180 MG tablet, Take 180 mg by mouth daily., Disp: , Rfl:    fluticasone (FLONASE) 50 MCG/ACT nasal spray, Place into both nostrils daily., Disp: , Rfl:    lamoTRIgine (LAMICTAL) 100 MG tablet, , Disp: , Rfl:    LATUDA 80 MG TABS tablet, Take 1 tablet by mouth daily., Disp: , Rfl:    levothyroxine (SYNTHROID) 112 MCG tablet, Take 1 tablet (112 mcg total) by mouth daily., Disp: 90 tablet, Rfl: 3   meloxicam (MOBIC) 15 MG tablet, TAKE 1 TABLET BY MOUTH EVERY DAY WITH FOOD, Disp: 30 tablet, Rfl: 0   NEXIUM 40 MG capsule, Take 40 mg by mouth daily at 12 noon. , Disp: , Rfl:    propranolol (INDERAL) 20 MG tablet, Take 1 tablet by mouth 2 (two) times daily., Disp: , Rfl:    Rhubarb (ESTROVEN MENOPAUSE RELIEF PO), Take  1 tablet by mouth daily., Disp: , Rfl:    TURMERIC CURCUMIN PO, Take by mouth daily., Disp: , Rfl:    vitamin B-12 (CYANOCOBALAMIN) 1000 MCG tablet, Take 1,000 mcg by mouth daily., Disp: , Rfl:   Social History   Tobacco Use  Smoking Status Former   Types: Cigarettes   Quit date: 04/25/1991   Years since quitting: 29.6  Smokeless Tobacco Never    Allergies  Allergen Reactions   Demerol [Meperidine]     migraines   Penicillins Hives   Sulfa Antibiotics Hives   Xylocaine [Lidocaine] Other (See Comments)    Doesn't numb   Objective:  There were no vitals filed for this visit. There is no height or weight on file to calculate BMI. Constitutional Well developed. Well nourished.  Vascular Dorsalis pedis pulses palpable bilaterally. Posterior tibial pulses palpable bilaterally. Capillary refill normal to all digits.  No cyanosis or clubbing noted. Pedal hair growth normal.  Neurologic Normal speech. Oriented to person, place, and time. Epicritic sensation to light touch grossly present bilaterally.  Dermatologic Painful ingrowing nail at lateral nail borders of the hallux nail right. No other open wounds. No skin lesions.  Orthopedic: Normal joint ROM without  pain or crepitus bilaterally. No visible deformities. No bony tenderness.   Radiographs: None Assessment:   1. Ingrown toenail of right foot    Plan:  Patient was evaluated and treated and all questions answered.  Ingrown Nail, right -Patient elects to proceed with minor surgery to remove ingrown toenail removal today. Consent reviewed and signed by patient. -Ingrown nail excised. See procedure note. -Educated on post-procedure care including soaking. Written instructions provided and reviewed. -Patient to follow up in 2 weeks for nail check.  Procedure: Excision of Ingrown Toenail Location: Right 1st toe lateral nail borders. Anesthesia: Lidocaine 1% plain; 1.5 mL and Marcaine 0.5% plain; 1.5 mL, digital  block. Skin Prep: Betadine. Dressing: Silvadene; telfa; dry, sterile, compression dressing. Technique: Following skin prep, the toe was exsanguinated and a tourniquet was secured at the base of the toe. The affected nail border was freed, split with a nail splitter, and excised. Chemical matrixectomy was then performed with phenol and irrigated out with alcohol. The tourniquet was then removed and sterile dressing applied. Disposition: Patient tolerated procedure well. Patient to return in 2 weeks for follow-up.   No follow-ups on file.

## 2020-12-04 ENCOUNTER — Encounter: Payer: Self-pay | Admitting: Podiatry

## 2020-12-07 ENCOUNTER — Other Ambulatory Visit: Payer: Self-pay | Admitting: Physical Medicine and Rehabilitation

## 2020-12-07 NOTE — Telephone Encounter (Signed)
Please advise 

## 2020-12-08 ENCOUNTER — Other Ambulatory Visit: Payer: Self-pay | Admitting: Podiatry

## 2020-12-08 MED ORDER — DOXYCYCLINE HYCLATE 100 MG PO TABS: 100.0000 mg | ORAL_TABLET | Freq: Two times a day (BID) | ORAL | 0 refills | Status: DC

## 2021-01-07 DIAGNOSIS — G4733 Obstructive sleep apnea (adult) (pediatric): Secondary | ICD-10-CM | POA: Diagnosis not present

## 2021-01-13 ENCOUNTER — Encounter: Payer: Self-pay | Admitting: Physical Medicine and Rehabilitation

## 2021-01-13 ENCOUNTER — Encounter: Payer: Self-pay | Admitting: Adult Health

## 2021-01-15 NOTE — Telephone Encounter (Signed)
Called pt get scheduled. LVM#1

## 2021-01-18 NOTE — Progress Notes (Signed)
Pt sent mychart message on 01/14/21 with ESS score "My score is 3. Only while lying down in the afternoon, and even then, not every time."

## 2021-01-19 ENCOUNTER — Telehealth (INDEPENDENT_AMBULATORY_CARE_PROVIDER_SITE_OTHER): Payer: BC Managed Care – PPO | Admitting: Adult Health

## 2021-01-19 DIAGNOSIS — Z9989 Dependence on other enabling machines and devices: Secondary | ICD-10-CM

## 2021-01-19 DIAGNOSIS — G4733 Obstructive sleep apnea (adult) (pediatric): Secondary | ICD-10-CM

## 2021-01-21 NOTE — Progress Notes (Addendum)
PATIENT: Jessica Berger DOB: [birth year]  REASON FOR VISIT: follow up HISTORY FROM: patient Virtual Visit via Video Note   I connected with [name] on 07/17/18 at  2:30 PM EDT by a video enabled telemedicine application located remotely at [facility] and verified that I am speaking with the correct person using two identifiers who was located at their own home.   I discussed the limitations of evaluation and management by telemedicine and the availability of in person appointments. The patient expressed understanding and agreed to proceed.      HISTORY OF PRESENT ILLNESS: Today 01/21/21:  Ms. Blevens is a 65 year old female with a history of obstructive sleep apnea on CPAP.  She returns today for follow-up.  Her download is below      REVIEW OF SYSTEMS: Out of a complete 14 system review of symptoms, the patient complains only of the following symptoms, and all other reviewed systems are negative.   ESS 3  ALLERGIES: Allergies  Allergen Reactions   Demerol [Meperidine]     migraines   Penicillins Hives   Sulfa Antibiotics Hives   Xylocaine [Lidocaine] Other (See Comments)    Doesn't numb    HOME MEDICATIONS: Outpatient Medications Prior to Visit  Medication Sig Dispense Refill   APPLE CIDER VINEGAR PO Take by mouth daily in the afternoon.     Cholecalciferol (VITAMIN D PO) Take by mouth.     clonazePAM (KLONOPIN) 0.5 MG tablet Take 1 tablet by mouth daily.     doxycycline (VIBRA-TABS) 100 MG tablet Take 1 tablet (100 mg total) by mouth 2 (two) times daily. 20 tablet 0   fexofenadine (ALLEGRA) 180 MG tablet Take 180 mg by mouth daily.     fluticasone (FLONASE) 50 MCG/ACT nasal spray Place into both nostrils daily.     lamoTRIgine (LAMICTAL) 100 MG tablet      LATUDA 80 MG TABS tablet Take 1 tablet by mouth daily.     levothyroxine (SYNTHROID) 112 MCG tablet Take 1 tablet (112 mcg total) by mouth daily. 90 tablet 3   meloxicam (MOBIC)  15 MG tablet TAKE 1 TABLET BY MOUTH EVERY DAY WITH FOOD 30 tablet 0   NEXIUM 40 MG capsule Take 40 mg by mouth daily at 12 noon.      propranolol (INDERAL) 20 MG tablet Take 1 tablet by mouth 2 (two) times daily.     Rhubarb (ESTROVEN MENOPAUSE RELIEF PO) Take 1 tablet by mouth daily.     TURMERIC CURCUMIN PO Take by mouth daily.     vitamin B-12 (CYANOCOBALAMIN) 1000 MCG tablet Take 1,000 mcg by mouth daily.     No facility-administered medications prior to visit.    PAST MEDICAL HISTORY: Past Medical History:  Diagnosis Date   Anemia, unspecified 04/18/2012   Anxiety    Bell's palsy    Bipolar 2 disorder ([place])    Depression    History of chickenpox    Hypercholesteremia    Hypothyroidism 02/01/2012   Ischemic colitis ([place]) 2010   Menopause    Migraine    OSA on CPAP    Ulcerative colitis ([place]) 02/01/2012    PAST SURGICAL HISTORY: Past Surgical History:  Procedure Laterality Date   CATARACT EXTRACTION W/ INTRAOCULAR LENS IMPLANT Left 03/14/2016   CATARACT EXTRACTION W/ INTRAOCULAR LENS IMPLANT Right 04/18/2016   NASAL SEPTUM SURGERY      FAMILY HISTORY: Family History  Problem Relation Age of Onset   Colon cancer Father 28  Colon   Hypertension Father    Breast cancer Maternal Grandmother    Heart failure Mother    Osteoarthritis Mother    Depression Mother    Hypertension Mother    Stroke Mother    Asthma Mother    Cancer Paternal Grandmother        skin cancer, nose   Heart attack Paternal Grandfather    Cancer Maternal Grandfather        lung   Angioedema Neg Hx    Allergic rhinitis Neg Hx    Eczema Neg Hx    Urticaria Neg Hx     SOCIAL HISTORY: Social History   Socioeconomic History   Marital status: Married    Spouse name: Not on file   Number of children: 1   Years of education: BA   Highest education level: Not on file  Occupational History   Occupation: N/A  Tobacco Use   Smoking status: Former    Types: Cigarettes    Quit date:  04/25/1991    Years since quitting: 29.7   Smokeless tobacco: Never  Vaping Use   Vaping Use: Never used  Substance and Sexual Activity   Alcohol use: Yes    Alcohol/week: 6.0 standard drinks    Types: 6 Standard drinks or equivalent per week    Comment: alcohol about 6 drinks a week   Drug use: No   Sexual activity: Yes    Partners: Male    Birth control/protection: Post-menopausal, Other-see comments    Comment: husband vasectomy  Other Topics Concern   Not on file  Social History Narrative   2 cups of coffee a day, occasional tea    Social Determinants of Health   Financial Resource Strain: Not on file  Food Insecurity: Not on file  Transportation Needs: Not on file  Physical Activity: Not on file  Stress: Not on file  Social Connections: Not on file  Intimate Partner Violence: Not on file      PHYSICAL EXAM  There were no vitals filed for this visit. There is no height or weight on file to calculate BMI.  Generalized: Well developed, in no acute distress  Chest: Lungs clear to auscultation bilaterally  Neurological examination  Mentation: Alert oriented to time, place, history taking. Follows all commands speech and language fluent Cranial nerve II-XII: Extraocular movements were full, visual field were full on confrontational test Head turning and shoulder shrug  were normal and symmetric. Motor: The motor testing reveals 5 over 5 strength of all 4 extremities. Good symmetric motor tone is noted throughout.  Sensory: Sensory testing is intact to soft touch on all 4 extremities. No evidence of extinction is noted.  Gait and station: Gait is normal.    DIAGNOSTIC DATA (LABS, IMAGING, TESTING) - I reviewed patient records, labs, notes, testing and imaging myself where available.  Lab Results  Component Value Date   WBC 5.3 12/05/2018   HGB 15.1 (H) 12/05/2018   HCT 44.2 12/05/2018   MCV 99.5 12/05/2018   PLT 255.0 12/05/2018      Component Value Date/Time    NA 137 12/05/2018 1432   NA 139 05/12/2014 0000   NA 138 04/18/2012 1346   K 4.3 12/05/2018 1432   K 4.0 04/18/2012 1346   CL 100 12/05/2018 1432   CL 104 04/18/2012 1346   CO2 28 12/05/2018 1432   CO2 25 04/18/2012 1346   GLUCOSE 114 (H) 12/05/2018 1432   GLUCOSE 93 04/18/2012 1346  BUN 10 12/05/2018 1432   BUN 10 05/12/2014 0000   BUN 17.2 04/18/2012 1346   CREATININE 0.87 12/05/2018 1432   CREATININE 1.0 04/18/2012 1346   CALCIUM 9.0 12/05/2018 1432   CALCIUM 9.1 04/18/2012 1346   PROT 6.8 03/29/2019 1400   PROT 6.8 04/18/2012 1346   ALBUMIN 4.3 03/29/2019 1400   ALBUMIN 3.6 04/18/2012 1346   AST 12 03/29/2019 1400   AST 15 04/18/2012 1346   ALT 11 03/29/2019 1400   ALT 12 04/18/2012 1346   ALKPHOS 63 03/29/2019 1400   ALKPHOS 78 04/18/2012 1346   BILITOT 0.5 03/29/2019 1400   BILITOT 0.38 04/18/2012 1346   Lab Results  Component Value Date   CHOL 274 (H) 12/05/2018   HDL 63.30 12/05/2018   LDLCALC 172 (H) 12/05/2018   TRIG 193.0 (H) 12/05/2018   CHOLHDL 4 12/05/2018   Lab Results  Component Value Date   HGBA1C 5.2 12/05/2018   Lab Results  Component Value Date   JOACZYSA63 016 08/12/2014   Lab Results  Component Value Date   TSH 1.46 04/06/2020      ASSESSMENT AND PLAN 65 y.o. year old female  has a past medical history of Anemia, unspecified (04/18/2012), Anxiety, Bell's palsy, Bipolar 2 disorder ([place]), Depression, History of chickenpox, Hypercholesteremia, Hypothyroidism (02/01/2012), Ischemic colitis ([place]) (2010), Menopause, Migraine, OSA on CPAP, and Ulcerative colitis ([place]) (02/01/2012). here with:  OSA on CPAP  - CPAP compliance excellent - Good treatment of AHI  - Encourage patient to use CPAP nightly and > 4 hours each night - F/U in 1 year or sooner if needed     Ward Givens, MSN, NP-C 01/21/2021, 8:04 AM [facility] [address], [place], [place] [address] [phone]

## 2021-02-04 DIAGNOSIS — F3132 Bipolar disorder, current episode depressed, moderate: Secondary | ICD-10-CM | POA: Diagnosis not present

## 2021-02-07 DIAGNOSIS — G4733 Obstructive sleep apnea (adult) (pediatric): Secondary | ICD-10-CM | POA: Diagnosis not present

## 2021-02-16 DIAGNOSIS — H00021 Hordeolum internum right upper eyelid: Secondary | ICD-10-CM | POA: Diagnosis not present

## 2021-03-01 ENCOUNTER — Other Ambulatory Visit: Payer: Self-pay | Admitting: Gastroenterology

## 2021-03-01 DIAGNOSIS — Z79899 Other long term (current) drug therapy: Secondary | ICD-10-CM

## 2021-03-08 ENCOUNTER — Other Ambulatory Visit: Payer: Self-pay | Admitting: Obstetrics and Gynecology

## 2021-03-08 DIAGNOSIS — Z1231 Encounter for screening mammogram for malignant neoplasm of breast: Secondary | ICD-10-CM

## 2021-03-10 DIAGNOSIS — G4733 Obstructive sleep apnea (adult) (pediatric): Secondary | ICD-10-CM | POA: Diagnosis not present

## 2021-03-26 DIAGNOSIS — F410 Panic disorder [episodic paroxysmal anxiety] without agoraphobia: Secondary | ICD-10-CM | POA: Diagnosis not present

## 2021-03-26 DIAGNOSIS — F3181 Bipolar II disorder: Secondary | ICD-10-CM | POA: Diagnosis not present

## 2021-03-28 ENCOUNTER — Other Ambulatory Visit: Payer: Self-pay | Admitting: Physician Assistant

## 2021-03-28 DIAGNOSIS — E039 Hypothyroidism, unspecified: Secondary | ICD-10-CM

## 2021-04-13 DIAGNOSIS — G4733 Obstructive sleep apnea (adult) (pediatric): Secondary | ICD-10-CM | POA: Diagnosis not present

## 2021-04-22 ENCOUNTER — Ambulatory Visit
Admission: RE | Admit: 2021-04-22 | Discharge: 2021-04-22 | Disposition: A | Discharge status: Home / Self Care | Payer: BC Managed Care – PPO | Source: Ambulatory Visit | Attending: Obstetrics and Gynecology | Admitting: Obstetrics and Gynecology

## 2021-04-22 DIAGNOSIS — Z1231 Encounter for screening mammogram for malignant neoplasm of breast: Secondary | ICD-10-CM | POA: Diagnosis not present

## 2021-06-08 DIAGNOSIS — L011 Impetiginization of other dermatoses: Secondary | ICD-10-CM | POA: Diagnosis not present

## 2021-06-08 DIAGNOSIS — L309 Dermatitis, unspecified: Secondary | ICD-10-CM | POA: Diagnosis not present

## 2021-06-27 ENCOUNTER — Other Ambulatory Visit: Payer: Self-pay | Admitting: Physician Assistant

## 2021-06-27 DIAGNOSIS — E039 Hypothyroidism, unspecified: Secondary | ICD-10-CM

## 2021-07-05 DIAGNOSIS — L820 Inflamed seborrheic keratosis: Secondary | ICD-10-CM | POA: Diagnosis not present

## 2021-07-15 ENCOUNTER — Encounter: Payer: Self-pay | Admitting: Physical Medicine and Rehabilitation

## 2021-07-23 DIAGNOSIS — G4733 Obstructive sleep apnea (adult) (pediatric): Secondary | ICD-10-CM | POA: Diagnosis not present

## 2021-08-23 ENCOUNTER — Encounter: Payer: Self-pay | Admitting: Physical Medicine and Rehabilitation

## 2021-08-23 ENCOUNTER — Ambulatory Visit: Payer: Self-pay

## 2021-08-23 ENCOUNTER — Ambulatory Visit: Payer: BC Managed Care – PPO | Admitting: Physical Medicine and Rehabilitation

## 2021-08-23 DIAGNOSIS — M5416 Radiculopathy, lumbar region: Secondary | ICD-10-CM

## 2021-08-23 MED ORDER — METHYLPREDNISOLONE ACETATE 80 MG/ML IJ SUSP
80.0000 mg | Freq: Once | INTRAMUSCULAR | Status: AC
  Administered 2021-08-23: 80 mg

## 2021-08-23 NOTE — Progress Notes (Signed)
Pt state lower back pain mostly on her right side. Pt state walking, standing and bending makes the pain worse. Pt state she takes pain meds and uses ice to help ease her pain.  Numeric Pain Rating Scale and Functional Assessment Average Pain 5   In the last MONTH (on 0-10 scale) has pain interfered with the following?  1. General activity like being  able to carry out your everyday physical activities such as walking, climbing stairs, carrying groceries, or moving a chair?  Rating(6)   +Driver, -BT, -Dye Allergies.

## 2021-08-23 NOTE — Patient Instructions (Signed)

## 2021-08-30 NOTE — Progress Notes (Signed)
Jessica Berger - 66 y.o. female MRN [id number]  Date of birth: 1955-05-02  Office Visit Note: Visit Date: 08/23/2021 PCP: Fredirick Lathe, PA-C Referred by: Allwardt, Randa Evens, PA-C  Subjective: Chief Complaint  Patient presents with   Lower Back - Pain   HPI:  Jessica Berger is a 66 y.o. female who comes in today for planned repeat Right L5-S1  Lumbar Interlaminar epidural steroid injection with fluoroscopic guidance.  The patient has failed conservative care including home exercise, medications, time and activity modification.  This injection will be diagnostic and hopefully therapeutic.  Please see requesting physician notes for further details and justification. Patient received more than 50% pain relief from prior injection.  Almost a full year of relief after epidural injection.  She has no new symptoms no new issues no trauma etc.  MRI reviewed again showing probably a little bit more stenosis than detailed on the report.  Radiofrequency ablation was not as successful as medial branch blocks.  Epidural injection has helped greatly.  Pain into the low back and buttocks.  Referring: Dr. Legrand Como Hilts   ROS Otherwise per HPI.  Assessment & Plan: Visit Diagnoses:    ICD-10-CM   1. Lumbar radiculopathy  M54.16 XR C-ARM NO REPORT    Epidural Steroid injection    methylPREDNISolone acetate (DEPO-MEDROL) injection 80 mg      Plan: No additional findings.   Meds & Orders:  Meds ordered this encounter  Medications   methylPREDNISolone acetate (DEPO-MEDROL) injection 80 mg    Orders Placed This Encounter  Procedures   XR C-ARM NO REPORT   Epidural Steroid injection    Follow-up: Return for visit to requesting provider as needed.   Procedures: No procedures performed  Lumbar Epidural Steroid Injection - Interlaminar Approach with Fluoroscopic Guidance  Patient: Jessica Berger      Date of Birth: 29-Nov-1955 MRN: [id number] PCP: Fredirick Lathe, PA-C      Visit  Date: 08/23/2021   Universal Protocol:     Consent Given By: the patient  Position: PRONE  Additional Comments: Vital signs were monitored before and after the procedure. Patient was prepped and draped in the usual sterile fashion. The correct patient, procedure, and site was verified.   Injection Procedure Details:   Procedure diagnoses: Lumbar radiculopathy [M54.16]   Meds Administered:  Meds ordered this encounter  Medications   methylPREDNISolone acetate (DEPO-MEDROL) injection 80 mg     Laterality: Right  Location/Site:  L5-S1  Needle: 3.5 in., 20 ga. Tuohy  Needle Placement: Paramedian epidural  Findings:   -Comments: Excellent flow of contrast into the epidural space.  Procedure Details: Using a paramedian approach from the side mentioned above, the region overlying the inferior lamina was localized under fluoroscopic visualization and the soft tissues overlying this structure were infiltrated with 4 ml. of 1% Lidocaine without Epinephrine. The Tuohy needle was inserted into the epidural space using a paramedian approach.   The epidural space was localized using loss of resistance along with counter oblique bi-planar fluoroscopic views.  After negative aspirate for air, blood, and CSF, a 2 ml. volume of Isovue-250 was injected into the epidural space and the flow of contrast was observed. Radiographs were obtained for documentation purposes.    The injectate was administered into the level noted above.   Additional Comments:  The patient tolerated the procedure well Dressing: 2 x 2 sterile gauze and Band-Aid    Post-procedure details: Patient was observed during the procedure. Post-procedure  instructions were reviewed.  Patient left the clinic in stable condition.   Clinical History: No specialty comments available.     Objective:  VS:  HT:    WT:   BMI:     BP:   HR: bpm  TEMP: ( )  RESP:  Physical Exam Vitals and nursing note reviewed.   Constitutional:      General: She is not in acute distress.    Appearance: Normal appearance. She is obese. She is not ill-appearing.  HENT:     Head: Normocephalic and atraumatic.     Right Ear: External ear normal.     Left Ear: External ear normal.  Eyes:     Extraocular Movements: Extraocular movements intact.  Cardiovascular:     Rate and Rhythm: Normal rate.     Pulses: Normal pulses.  Pulmonary:     Effort: Pulmonary effort is normal. No respiratory distress.  Abdominal:     General: There is no distension.     Palpations: Abdomen is soft.  Musculoskeletal:        General: Tenderness present.     Cervical back: Neck supple.     Right lower leg: No edema.     Left lower leg: No edema.     Comments: Patient has good distal strength with no pain over the greater trochanters.  No clonus or focal weakness.  Skin:    Findings: No erythema, lesion or rash.  Neurological:     General: No focal deficit present.     Mental Status: She is alert and oriented to person, place, and time.     Sensory: No sensory deficit.     Motor: No weakness or abnormal muscle tone.     Coordination: Coordination normal.  Psychiatric:        Mood and Affect: Mood normal.        Behavior: Behavior normal.      Imaging: No results found.

## 2021-08-30 NOTE — Procedures (Signed)
Lumbar Epidural Steroid Injection - Interlaminar Approach with Fluoroscopic Guidance  Patient: Jessica Berger      Date of Birth: 1955-08-03 MRN: [id number] PCP: Fredirick Lathe, PA-C      Visit Date: 08/23/2021   Universal Protocol:     Consent Given By: the patient  Position: PRONE  Additional Comments: Vital signs were monitored before and after the procedure. Patient was prepped and draped in the usual sterile fashion. The correct patient, procedure, and site was verified.   Injection Procedure Details:   Procedure diagnoses: Lumbar radiculopathy [M54.16]   Meds Administered:  Meds ordered this encounter  Medications   methylPREDNISolone acetate (DEPO-MEDROL) injection 80 mg     Laterality: Right  Location/Site:  L5-S1  Needle: 3.5 in., 20 ga. Tuohy  Needle Placement: Paramedian epidural  Findings:   -Comments: Excellent flow of contrast into the epidural space.  Procedure Details: Using a paramedian approach from the side mentioned above, the region overlying the inferior lamina was localized under fluoroscopic visualization and the soft tissues overlying this structure were infiltrated with 4 ml. of 1% Lidocaine without Epinephrine. The Tuohy needle was inserted into the epidural space using a paramedian approach.   The epidural space was localized using loss of resistance along with counter oblique bi-planar fluoroscopic views.  After negative aspirate for air, blood, and CSF, a 2 ml. volume of Isovue-250 was injected into the epidural space and the flow of contrast was observed. Radiographs were obtained for documentation purposes.    The injectate was administered into the level noted above.   Additional Comments:  The patient tolerated the procedure well Dressing: 2 x 2 sterile gauze and Band-Aid    Post-procedure details: Patient was observed during the procedure. Post-procedure instructions were reviewed.  Patient left the clinic in stable  condition.

## 2021-09-01 DIAGNOSIS — L814 Other melanin hyperpigmentation: Secondary | ICD-10-CM | POA: Diagnosis not present

## 2021-09-01 DIAGNOSIS — D2261 Melanocytic nevi of right upper limb, including shoulder: Secondary | ICD-10-CM | POA: Diagnosis not present

## 2021-09-01 DIAGNOSIS — D1801 Hemangioma of skin and subcutaneous tissue: Secondary | ICD-10-CM | POA: Diagnosis not present

## 2021-09-01 DIAGNOSIS — L821 Other seborrheic keratosis: Secondary | ICD-10-CM | POA: Diagnosis not present

## 2021-09-01 DIAGNOSIS — D692 Other nonthrombocytopenic purpura: Secondary | ICD-10-CM | POA: Diagnosis not present

## 2021-09-10 DIAGNOSIS — F410 Panic disorder [episodic paroxysmal anxiety] without agoraphobia: Secondary | ICD-10-CM | POA: Diagnosis not present

## 2021-09-10 DIAGNOSIS — F3181 Bipolar II disorder: Secondary | ICD-10-CM | POA: Diagnosis not present

## 2021-09-16 DIAGNOSIS — K760 Fatty (change of) liver, not elsewhere classified: Secondary | ICD-10-CM | POA: Diagnosis not present

## 2021-09-16 DIAGNOSIS — K219 Gastro-esophageal reflux disease without esophagitis: Secondary | ICD-10-CM | POA: Diagnosis not present

## 2021-09-16 DIAGNOSIS — Z86010 Personal history of colonic polyps: Secondary | ICD-10-CM | POA: Diagnosis not present

## 2021-09-16 DIAGNOSIS — K449 Diaphragmatic hernia without obstruction or gangrene: Secondary | ICD-10-CM | POA: Diagnosis not present

## 2021-09-22 NOTE — Progress Notes (Signed)
66 y.o. G20P1001 Married White or Caucasian Not Hispanic or Latino female here for annual exam.  No vaginal bleeding. Sexually active, no dyspareunia.   No bowel or bladder c/o. Sometimes needs to double void to empty.   She has lost 50 lbs in the last year with counting calories and exercise.     Patient's last menstrual period was 01/31/2002 (approximate).          Sexually active: Yes.    The current method of family planning is post menopausal status.    Exercising: Yes.     Cardio and strength training Smoker:  no  Health Maintenance: Pap:  09/04/18 Wnl Hr HPV Neg 05/12/15 Neg Hr HPV Neg  History of abnormal Pap:  no MMG:  04/22/21 Bi-rads one neg.  BMD:   Scheduled for September, 7, 2023 Colonoscopy: 9/22 with Dr Collene Mares, polyps.  F/u 9/25  TDaP:  2014 Gardasil: n/a   reports that she quit smoking about 30 years ago. Her smoking use included cigarettes. She has never used smokeless tobacco. She reports current alcohol use of about 6.0 standard drinks of alcohol per week. She reports that she does not use drugs. Homemaker. Daughter in [place], considering moving back to [place]. No grandchildren.  Past Medical History:  Diagnosis Date   Anemia, unspecified 04/18/2012   Anxiety    Bell's palsy    Bipolar 2 disorder ([place])    Depression    History of chickenpox    Hypercholesteremia    Hypothyroidism 02/01/2012   Ischemic colitis ([place]) 2010   Menopause    Migraine    OSA on CPAP    Ulcerative colitis ([place]) 02/01/2012    Past Surgical History:  Procedure Laterality Date   CATARACT EXTRACTION W/ INTRAOCULAR LENS IMPLANT Left 03/14/2016   CATARACT EXTRACTION W/ INTRAOCULAR LENS IMPLANT Right 04/18/2016   NASAL SEPTUM SURGERY      Current Outpatient Medications  Medication Sig Dispense Refill   Cholecalciferol (VITAMIN D PO) Take by mouth.     clonazePAM (KLONOPIN) 0.5 MG tablet Take 1 tablet by mouth daily.     fexofenadine (ALLEGRA) 180 MG tablet Take 180 mg by mouth daily.      fluticasone (FLONASE) 50 MCG/ACT nasal spray Place into both nostrils daily.     lamoTRIgine (LAMICTAL) 100 MG tablet      LATUDA 80 MG TABS tablet Take 1 tablet by mouth daily.     levothyroxine (SYNTHROID) 112 MCG tablet TAKE 1 TABLET DAILY 90 tablet 0   meloxicam (MOBIC) 15 MG tablet TAKE 1 TABLET BY MOUTH EVERY DAY WITH FOOD 30 tablet 0   NEXIUM 40 MG capsule Take 40 mg by mouth daily at 12 noon.      propranolol (INDERAL) 20 MG tablet Take 1 tablet by mouth 2 (two) times daily.     Rhubarb (ESTROVEN MENOPAUSE RELIEF PO) Take 1 tablet by mouth daily.     vitamin B-12 (CYANOCOBALAMIN) 1000 MCG tablet Take 1,000 mcg by mouth daily.     No current facility-administered medications for this visit.    Family History  Problem Relation Age of Onset   Colon cancer Father 41       Colon   Hypertension Father    Breast cancer Maternal Grandmother    Heart failure Mother    Osteoarthritis Mother    Depression Mother    Hypertension Mother    Stroke Mother    Asthma Mother    Cancer Paternal Grandmother  skin cancer, nose   Heart attack Paternal Grandfather    Cancer Maternal Grandfather        lung   Angioedema Neg Hx    Allergic rhinitis Neg Hx    Eczema Neg Hx    Urticaria Neg Hx     Review of Systems  All other systems reviewed and are negative.   Exam:   BP 104/74   Pulse 72   Ht 5' 6.5" (1.689 m)   Wt 204 lb (92.5 kg)   LMP 01/31/2002 (Approximate)   SpO2 98%   BMI 32.43 kg/m   Weight change: @WEIGHTCHANGE @ Height:   Height: 5' 6.5" (168.9 cm)  Ht Readings from Last 3 Encounters:  09/29/21 5' 6.5" (1.689 m)  09/28/20 5' 6.5" (1.689 m)  08/10/20 5' 6"  (1.676 m)    General appearance: alert, cooperative and appears stated age Head: Normocephalic, without obvious abnormality, atraumatic Neck: no adenopathy, supple, symmetrical, trachea midline and thyroid normal to inspection and palpation Lungs: clear to auscultation bilaterally Cardiovascular: regular  rate and rhythm Breasts: normal appearance, no masses or tenderness Abdomen: soft, non-tender; non distended,  no masses,  no organomegaly Extremities: extremities normal, atraumatic, no cyanosis or edema Skin: Skin color, texture, turgor normal. No rashes or lesions Lymph nodes: Cervical, supraclavicular, and axillary nodes normal. No abnormal inguinal nodes palpated Neurologic: Grossly normal   Pelvic: External genitalia:  no lesions              Urethra:  normal appearing urethra with no masses, tenderness or lesions              Bartholins and Skenes: normal                 Vagina: atrophic appearing vagina with normal color and discharge, no lesions              Cervix: no lesions               Bimanual Exam:  Uterus:   no masses or tenderness              Adnexa: no mass, fullness, tenderness               Rectovaginal: Confirms               Anus:  normal sphincter tone, no lesions  Gae Dry, CMA chaperoned for the exam.  1. Well woman exam Discussed breast self exam Discussed calcium and vit D intake Mammogram and colonoscopy are UTD Labs with primary No pap this year

## 2021-09-29 ENCOUNTER — Encounter: Payer: Self-pay | Admitting: Oncology

## 2021-09-29 ENCOUNTER — Ambulatory Visit (INDEPENDENT_AMBULATORY_CARE_PROVIDER_SITE_OTHER): Payer: BC Managed Care – PPO | Admitting: Obstetrics and Gynecology

## 2021-09-29 ENCOUNTER — Encounter: Payer: Self-pay | Admitting: Obstetrics and Gynecology

## 2021-09-29 VITALS — BP 104/74 | HR 72 | Ht 66.5 in | Wt 204.0 lb

## 2021-09-29 DIAGNOSIS — Z01419 Encounter for gynecological examination (general) (routine) without abnormal findings: Secondary | ICD-10-CM | POA: Diagnosis not present

## 2021-09-29 NOTE — Patient Instructions (Signed)
EXERCISE   We recommended that you start or continue a regular exercise program for good health. Physical activity is anything that gets your body moving, some is better than none. The CDC recommends 150 minutes per week of Moderate-Intensity Aerobic Activity and 2 or more days of Muscle Strengthening Activity.  Benefits of exercise are limitless: helps weight loss/weight maintenance, improves mood and energy, helps with depression and anxiety, improves sleep, tones and strengthens muscles, improves balance, improves bone density, protects from chronic conditions such as heart disease, high blood pressure and diabetes and so much more. To learn more visit: WhyNotPoker.uy  DIET: Good nutrition starts with a healthy diet of fruits, vegetables, whole grains, and lean protein sources. Drink plenty of water for hydration. Minimize empty calories, sodium, sweets. For more information about dietary recommendations visit: GeekRegister.com.ee and http://schaefer-mitchell.com/  ALCOHOL:  Women should limit their alcohol intake to no more than 7 drinks/beers/glasses of wine (combined, not each!) per week. Moderation of alcohol intake to this level decreases your risk of breast cancer and liver damage.  If you are concerned that you may have a problem, or your friends have told you they are concerned about your drinking, there are many resources to help. A well-known program that is free, effective, and available to all people all over the nation is Alcoholics Anonymous.  Check out this site to learn more: BlockTaxes.se   CALCIUM AND VITAMIN D:  Adequate intake of calcium and Vitamin D are recommended for bone health.  You should be getting between 1000-1200 mg of calcium and 800 units of Vitamin D daily between diet and supplements   MAMMOGRAMS:  All women over 46 years old should have a routine mammogram.   COLON CANCER  SCREENING: Now recommend starting at age 71. At this time colonoscopy is not covered for routine screening until 50. There are take home tests that can be done between 45-49.   COLONOSCOPY:  Colonoscopy to screen for colon cancer is recommended for all women at age 44.  We know, you hate the idea of the prep.  We agree, BUT, having colon cancer and not knowing it is worse!!  Colon cancer so often starts as a polyp that can be seen and removed at colonscopy, which can quite literally save your life!  And if your first colonoscopy is normal and you have no family history of colon cancer, most women don't have to have it again for 10 years.  Once every ten years, you can do something that may end up saving your life, right?  We will be happy to help you get it scheduled when you are ready.  Be sure to check your insurance coverage so you understand how much it will cost.  It may be covered as a preventative service at no cost, but you should check your particular policy.      Breast Self-Awareness Breast self-awareness means being familiar with how your breasts look and feel. It involves checking your breasts regularly and reporting any changes to your health care provider. Practicing breast self-awareness is important. A change in your breasts can be a sign of a serious medical problem. Being familiar with how your breasts look and feel allows you to find any problems early, when treatment is more likely to be successful. All women should practice breast self-awareness, including women who have had breast implants. How to do a breast self-exam One way to learn what is normal for your breasts and whether your breasts are changing is  to do a breast self-exam. To do a breast self-exam: Look for Changes  Remove all the clothing above your waist. Stand in front of a mirror in a room with good lighting. Put your hands on your hips. Push your hands firmly downward. Compare your breasts in the mirror. Look  for differences between them (asymmetry), such as: Differences in shape. Differences in size. Puckers, dips, and bumps in one breast and not the other. Look at each breast for changes in your skin, such as: Redness. Scaly areas. Look for changes in your nipples, such as: Discharge. Bleeding. Dimpling. Redness. A change in position. Feel for Changes Carefully feel your breasts for lumps and changes. It is best to do this while lying on your back on the floor and again while sitting or standing in the shower or tub with soapy water on your skin. Feel each breast in the following way: Place the arm on the side of the breast you are examining above your head. Feel your breast with the other hand. Start in the nipple area and make  inch (2 cm) overlapping circles to feel your breast. Use the pads of your three middle fingers to do this. Apply light pressure, then medium pressure, then firm pressure. The light pressure will allow you to feel the tissue closest to the skin. The medium pressure will allow you to feel the tissue that is a little deeper. The firm pressure will allow you to feel the tissue close to the ribs. Continue the overlapping circles, moving downward over the breast until you feel your ribs below your breast. Move one finger-width toward the center of the body. Continue to use the  inch (2 cm) overlapping circles to feel your breast as you move slowly up toward your collarbone. Continue the up and down exam using all three pressures until you reach your armpit.  Write Down What You Find  Write down what is normal for each breast and any changes that you find. Keep a written record with breast changes or normal findings for each breast. By writing this information down, you do not need to depend only on memory for size, tenderness, or location. Write down where you are in your menstrual cycle, if you are still menstruating. If you are having trouble noticing differences in your  breasts, do not get discouraged. With time you will become more familiar with the variations in your breasts and more comfortable with the exam. How often should I examine my breasts? Examine your breasts every month. If you are breastfeeding, the best time to examine your breasts is after a feeding or after using a breast pump. If you menstruate, the best time to examine your breasts is 5-7 days after your period is over. During your period, your breasts are lumpier, and it may be more difficult to notice changes. When should I see my health care provider? See your health care provider if you notice: A change in shape or size of your breasts or nipples. A change in the skin of your breast or nipples, such as a reddened or scaly area. Unusual discharge from your nipples. A lump or thick area that was not there before. Pain in your breasts. Anything that concerns you.

## 2021-10-07 ENCOUNTER — Ambulatory Visit
Admission: RE | Admit: 2021-10-07 | Discharge: 2021-10-07 | Disposition: A | Discharge status: Home / Self Care | Payer: BC Managed Care – PPO | Source: Ambulatory Visit | Attending: Gastroenterology | Admitting: Gastroenterology

## 2021-10-07 DIAGNOSIS — Z79899 Other long term (current) drug therapy: Secondary | ICD-10-CM

## 2021-10-07 DIAGNOSIS — Z780 Asymptomatic menopausal state: Secondary | ICD-10-CM | POA: Diagnosis not present

## 2021-10-17 ENCOUNTER — Other Ambulatory Visit: Payer: Self-pay | Admitting: Physician Assistant

## 2021-10-17 DIAGNOSIS — E039 Hypothyroidism, unspecified: Secondary | ICD-10-CM

## 2021-10-25 ENCOUNTER — Encounter: Payer: Self-pay | Admitting: *Deleted

## 2021-10-29 ENCOUNTER — Ambulatory Visit (INDEPENDENT_AMBULATORY_CARE_PROVIDER_SITE_OTHER): Payer: BC Managed Care – PPO | Admitting: Physician Assistant

## 2021-10-29 ENCOUNTER — Encounter: Payer: Self-pay | Admitting: Physician Assistant

## 2021-10-29 VITALS — BP 142/82 | HR 53 | Temp 98.0°F | Ht 66.5 in | Wt 203.8 lb

## 2021-10-29 DIAGNOSIS — E559 Vitamin D deficiency, unspecified: Secondary | ICD-10-CM | POA: Diagnosis not present

## 2021-10-29 DIAGNOSIS — E039 Hypothyroidism, unspecified: Secondary | ICD-10-CM | POA: Diagnosis not present

## 2021-10-29 DIAGNOSIS — Z23 Encounter for immunization: Secondary | ICD-10-CM

## 2021-10-29 DIAGNOSIS — E782 Mixed hyperlipidemia: Secondary | ICD-10-CM | POA: Diagnosis not present

## 2021-10-29 DIAGNOSIS — Z0000 Encounter for general adult medical examination without abnormal findings: Secondary | ICD-10-CM | POA: Diagnosis not present

## 2021-10-29 DIAGNOSIS — Z131 Encounter for screening for diabetes mellitus: Secondary | ICD-10-CM | POA: Diagnosis not present

## 2021-10-29 LAB — COMPREHENSIVE METABOLIC PANEL
ALT: 10 U/L (ref 0–35)
AST: 16 U/L (ref 0–37)
Albumin: 4.1 g/dL (ref 3.5–5.2)
Alkaline Phosphatase: 64 U/L (ref 39–117)
BUN: 9 mg/dL (ref 6–23)
CO2: 27 mEq/L (ref 19–32)
Calcium: 9.3 mg/dL (ref 8.4–10.5)
Chloride: 98 mEq/L (ref 96–112)
Creatinine, Ser: 0.87 mg/dL (ref 0.40–1.20)
GFR: 69.95 mL/min (ref >60.00)
Glucose, Bld: 105 mg/dL — ABNORMAL HIGH (ref 70–99)
Potassium: 4.1 mEq/L (ref 3.5–5.1)
Sodium: 133 mEq/L — ABNORMAL LOW (ref 135–145)
Total Bilirubin: 0.5 mg/dL (ref 0.2–1.2)
Total Protein: 6.7 g/dL (ref 6.0–8.3)

## 2021-10-29 LAB — CBC WITH DIFFERENTIAL/PLATELET
Basophils Absolute: 0 10*3/uL (ref 0.0–0.1)
Basophils Relative: 0.9 % (ref 0.0–3.0)
Eosinophils Absolute: 0.1 10*3/uL (ref 0.0–0.7)
Eosinophils Relative: 2.4 % (ref 0.0–5.0)
HCT: 42 % (ref 36.0–46.0)
Hemoglobin: 14.4 g/dL (ref 12.0–15.0)
Lymphocytes Relative: 26.3 % (ref 12.0–46.0)
Lymphs Abs: 1.4 10*3/uL (ref 0.7–4.0)
MCHC: 34.3 g/dL (ref 30.0–36.0)
MCV: 93.5 fl (ref 78.0–100.0)
Monocytes Absolute: 0.5 10*3/uL (ref 0.1–1.0)
Monocytes Relative: 9.3 % (ref 3.0–12.0)
Neutro Abs: 3.3 10*3/uL (ref 1.4–7.7)
Neutrophils Relative %: 61.1 % (ref 43.0–77.0)
Platelets: 313 10*3/uL (ref 150.0–400.0)
RBC: 4.49 Mil/uL (ref 3.87–5.11)
RDW: 14.8 % (ref 11.5–15.5)
WBC: 5.4 10*3/uL (ref 4.0–10.5)

## 2021-10-29 LAB — LIPID PANEL
Cholesterol: 214 mg/dL — ABNORMAL HIGH (ref 0–200)
HDL: 65.6 mg/dL (ref >39.00)
LDL Cholesterol: 132 mg/dL — ABNORMAL HIGH (ref 0–99)
NonHDL: 148.62
Total CHOL/HDL Ratio: 3
Triglycerides: 85 mg/dL (ref 0.0–149.0)
VLDL: 17 mg/dL (ref 0.0–40.0)

## 2021-10-29 LAB — VITAMIN D 25 HYDROXY (VIT D DEFICIENCY, FRACTURES): VITD: 65.11 ng/mL (ref 30.00–100.00)

## 2021-10-29 LAB — TSH: TSH: 0.6 u[IU]/mL (ref 0.35–5.50)

## 2021-10-29 LAB — HEMOGLOBIN A1C: Hgb A1c MFr Bld: 5.5 % (ref 4.6–6.5)

## 2021-10-29 NOTE — Progress Notes (Unsigned)
Subjective:    Patient ID: Jessica Berger, female    DOB: 1955-05-17, 66 y.o.   MRN: [id number]  Chief Complaint  Patient presents with   Annual Exam    Pt in for annual CPE; pt is fasting for labs; pt has no concerns to discuss and everything is going well; pt requests flu shot today, pt wants to look at places on chin and lip small lump and when touching causes some discomfort; pt tested positive Covid 10/19/21; but feeling much better;     HPI Patient is in today for annual exam.  Acute concerns: Recovering from COVID-19, doing well Contact dermatitis - seeing dermatology (thought was it was from hair color)  Fainted July 29 - bowel issues / cramps, bit lip and bumped chin, still seems to have some scar tissue there  - she did follow with GI doctor the next week   Health maintenance: Lifestyle/ exercise: Walking Nutrition: Well-balanced Mental health: Stable Sleep: Doing well  Immunizations: Will update today  Colonoscopy: UTD with Dr. Collene Mares per patient  Pap: Aged out of, still sees GYN  Mammogram: UTD DEXA: UTD     Past Medical History:  Diagnosis Date   Anemia, unspecified 04/18/2012   Anxiety    Bell's palsy    Bipolar 2 disorder ([place])    Depression    History of chickenpox    Hypercholesteremia    Hypothyroidism 02/01/2012   Ischemic colitis ([place]) 2010   Menopause    Migraine    OSA on CPAP    Ulcerative colitis ([place]) 02/01/2012    Past Surgical History:  Procedure Laterality Date   CATARACT EXTRACTION W/ INTRAOCULAR LENS IMPLANT Left 03/14/2016   CATARACT EXTRACTION W/ INTRAOCULAR LENS IMPLANT Right 04/18/2016   NASAL SEPTUM SURGERY      Family History  Problem Relation Age of Onset   Colon cancer Father 80       Colon   Hypertension Father    Breast cancer Maternal Grandmother    Heart failure Mother    Osteoarthritis Mother    Depression Mother    Hypertension Mother    Stroke Mother    Asthma Mother    Cancer Paternal Grandmother        skin  cancer, nose   Heart attack Paternal Grandfather    Cancer Maternal Grandfather        lung   Angioedema Neg Hx    Allergic rhinitis Neg Hx    Eczema Neg Hx    Urticaria Neg Hx     Social History   Tobacco Use   Smoking status: Former    Types: Cigarettes    Quit date: 04/25/1991    Years since quitting: 30.5   Smokeless tobacco: Never  Vaping Use   Vaping Use: Never used  Substance Use Topics   Alcohol use: Yes    Alcohol/week: 6.0 standard drinks of alcohol    Types: 6 Standard drinks or equivalent per week    Comment: alcohol about 6 drinks a week   Drug use: No     Allergies  Allergen Reactions   Demerol [Meperidine]     migraines   Penicillins Hives   Sulfa Antibiotics Hives   Xylocaine [Lidocaine] Other (See Comments)    Doesn't numb    Review of Systems NEGATIVE UNLESS OTHERWISE INDICATED IN HPI      Objective:     BP (!) 166/78 (BP Location: Left Arm)   Pulse (!) 53   Temp  98 F (36.7 C) (Temporal)   Ht 5' 6.5" (1.689 m)   Wt 203 lb 12.8 oz (92.4 kg)   LMP 01/31/2002 (Approximate)   SpO2 98%   BMI 32.40 kg/m   Wt Readings from Last 3 Encounters:  10/29/21 203 lb 12.8 oz (92.4 kg)  09/29/21 204 lb (92.5 kg)  09/28/20 236 lb (107 kg)    BP Readings from Last 3 Encounters:  10/29/21 (!) 166/78  09/29/21 104/74  09/28/20 124/72     Physical Exam Vitals and nursing note reviewed.  Constitutional:      Appearance: Normal appearance. She is obese. She is not toxic-appearing.  HENT:     Head: Normocephalic and atraumatic.     Right Ear: Tympanic membrane, ear canal and external ear normal.     Left Ear: Tympanic membrane, ear canal and external ear normal.     Nose: Nose normal.     Mouth/Throat:     Mouth: Mucous membranes are moist.  Eyes:     Extraocular Movements: Extraocular movements intact.     Conjunctiva/sclera: Conjunctivae normal.     Pupils: Pupils are equal, round, and reactive to light.  Cardiovascular:     Rate and  Rhythm: Normal rate and regular rhythm.     Pulses: Normal pulses.     Heart sounds: Normal heart sounds.  Pulmonary:     Effort: Pulmonary effort is normal.     Breath sounds: Normal breath sounds.  Abdominal:     General: Abdomen is flat. Bowel sounds are normal.     Palpations: Abdomen is soft.  Musculoskeletal:        General: Normal range of motion.     Cervical back: Normal range of motion and neck supple.  Skin:    General: Skin is warm and dry.  Neurological:     General: No focal deficit present.     Mental Status: She is alert and oriented to person, place, and time.  Psychiatric:        Mood and Affect: Mood normal.        Behavior: Behavior normal.        Thought Content: Thought content normal.        Judgment: Judgment normal.        Assessment & Plan:  Encounter for annual physical exam -     CBC with Differential/Platelet -     Comprehensive metabolic panel -     Lipid panel -     TSH -     Hemoglobin A1c  Hypothyroidism, unspecified type -     TSH  Mixed hyperlipidemia -     Lipid panel  Vitamin D deficiency -     VITAMIN D 25 Hydroxy (Vit-D Deficiency, Fractures)  Diabetes mellitus screening -     Comprehensive metabolic panel -     Hemoglobin A1c  Need for vaccination against Streptococcus pneumoniae -     Pneumococcal conjugate vaccine 20-valent  Need for immunization against influenza -     Flu Vaccine QUAD High Dose(Fluad)        Return in about 1 year (around 10/30/2022) for CPE, labs .  This note was prepared with assistance of Systems analyst. Occasional wrong-word or sound-a-like substitutions may have occurred due to the inherent limitations of voice recognition software.  Time Spent: *** minutes of total time was spent on the date of the encounter performing the following actions: chart review prior to seeing the patient, obtaining history, performing  a medically necessary exam, counseling on the treatment  plan, placing orders, and documenting in our EHR.       Calisa Luckenbaugh M Veatrice Eckstein, PA-C

## 2021-10-29 NOTE — Patient Instructions (Addendum)
Great to see you! Labs today Flu and Prevnar updated Keep working on lifestyle   Sign for release latest colonoscopy results   Call if any concerns

## 2021-10-30 NOTE — Assessment & Plan Note (Signed)
Recheck TSH  Cont on levothyroxine 112 mcg daily pending stable labs

## 2021-10-30 NOTE — Assessment & Plan Note (Signed)
Lipid panel today Cont to work on lifestyle Would consider statin given her risk score, pending labs this year  The 10-year ASCVD risk score (Arnett DK, et al., 2019) is: 6.5%

## 2021-10-30 NOTE — Assessment & Plan Note (Signed)
Age-appropriate screening and counseling performed today. Will check labs and call with results. Preventive measures discussed and printed in AVS for patient.   Flu and prevnar updated today   Patient Counseling: [x]   Nutrition: Stressed importance of moderation in sodium/caffeine intake, saturated fat and cholesterol, caloric balance, sufficient intake of fresh fruits, vegetables, and fiber.  [x]   Stressed the importance of regular exercise.   [x]   Substance Abuse: Discussed cessation/primary prevention of tobacco, alcohol, or other drug use; driving or other dangerous activities under the influence; availability of treatment for abuse.   [x]   Injury prevention: Discussed safety belts, safety helmets, smoke detector, smoking near bedding or upholstery.   []   Sexuality: Discussed sexually transmitted diseases, partner selection, use of condoms, avoidance of unintended pregnancy  and contraceptive alternatives.   [x]   Dental health: Discussed importance of regular tooth brushing, flossing, and dental visits.  [x]   Health maintenance and immunizations reviewed. Please refer to Health maintenance section.

## 2021-11-09 DIAGNOSIS — G4733 Obstructive sleep apnea (adult) (pediatric): Secondary | ICD-10-CM | POA: Diagnosis not present

## 2021-12-07 DIAGNOSIS — D485 Neoplasm of uncertain behavior of skin: Secondary | ICD-10-CM | POA: Diagnosis not present

## 2021-12-07 DIAGNOSIS — L309 Dermatitis, unspecified: Secondary | ICD-10-CM | POA: Diagnosis not present

## 2021-12-10 DIAGNOSIS — G4733 Obstructive sleep apnea (adult) (pediatric): Secondary | ICD-10-CM | POA: Diagnosis not present

## 2022-01-09 DIAGNOSIS — G4733 Obstructive sleep apnea (adult) (pediatric): Secondary | ICD-10-CM | POA: Diagnosis not present

## 2022-01-17 ENCOUNTER — Encounter: Payer: Self-pay | Admitting: *Deleted

## 2022-01-17 NOTE — Progress Notes (Unsigned)
PATIENT: Jessica Berger DOB: 1955/04/24  REASON FOR VISIT: follow up HISTORY FROM: patient PRIMARY NEUROLOGIST: Dr. Frances Furbish  Virtual Visit via Video Note  I connected with Jessica Berger on 01/18/22 at  1:45 PM EST by a video enabled telemedicine application located remotely at [facility] and verified that I am speaking with the correct person using two identifiers who was located at their own home.   I discussed the limitations of evaluation and management by telemedicine and the availability of in person appointments. The patient expressed understanding and agreed to proceed.   PATIENT: Jessica Berger DOB: [birth year]  REASON FOR VISIT: follow up HISTORY FROM: patient  HISTORY OF PRESENT ILLNESS: Today 01/18/22:  Jessica Berger is a 66 year old female with a history of OSA on CPAP. She returns today for follow-up. Reports that the CPAP is working well. Denies any new issues. Would like a new machine as hers is old. DL is below    REVIEW OF SYSTEMS: Out of a complete 14 system review of symptoms, the patient complains only of the following symptoms, and all other reviewed systems are negative.  ALLERGIES: Allergies  Allergen Reactions   Demerol [Meperidine]     migraines   Penicillins Hives   Sulfa Antibiotics Hives   Xylocaine [Lidocaine] Other (See Comments)    Doesn't numb    HOME MEDICATIONS: Outpatient Medications Prior to Visit  Medication Sig Dispense Refill   chlordiazePOXIDE (LIBRIUM) 10 MG capsule Take 10 mg by mouth daily.     Cholecalciferol (VITAMIN D PO) Take by mouth.     clonazePAM (KLONOPIN) 0.5 MG tablet Take 1 tablet by mouth daily. Pt taking 1/2 tab till 11/04/21 then stopping     fexofenadine (ALLEGRA) 180 MG tablet Take 180 mg by mouth daily.     fluticasone (FLONASE) 50 MCG/ACT nasal spray Place into both nostrils daily.     lamoTRIgine (LAMICTAL) 100 MG tablet      LATUDA 80 MG TABS tablet Take 1 tablet by mouth daily.      levothyroxine (SYNTHROID) 112 MCG tablet TAKE 1 TABLET DAILY (NEED APPOINTMENT FOR FUTURE REFILLS) 90 tablet 3   meloxicam (MOBIC) 15 MG tablet TAKE 1 TABLET BY MOUTH EVERY DAY WITH FOOD 30 tablet 0   NEXIUM 40 MG capsule Take 40 mg by mouth daily at 12 noon.      propranolol (INDERAL) 20 MG tablet Take 1 tablet by mouth 2 (two) times daily.     Rhubarb (ESTROVEN MENOPAUSE RELIEF PO) Take 1 tablet by mouth daily.     vitamin B-12 (CYANOCOBALAMIN) 1000 MCG tablet Take 1,000 mcg by mouth daily.     No facility-administered medications prior to visit.    PAST MEDICAL HISTORY: Past Medical History:  Diagnosis Date   Anemia, unspecified 04/18/2012   Anxiety    Bell's palsy    Bipolar 2 disorder ([facility])    Depression    History of chickenpox    Hypercholesteremia    Hypothyroidism 02/01/2012   Ischemic colitis ([facility]) 2010   Menopause    Migraine    OSA on CPAP    Ulcerative colitis ([facility]) 02/01/2012    PAST SURGICAL HISTORY: Past Surgical History:  Procedure Laterality Date   CATARACT EXTRACTION W/ INTRAOCULAR LENS IMPLANT Left 03/14/2016   CATARACT EXTRACTION W/ INTRAOCULAR LENS IMPLANT Right 04/18/2016   NASAL SEPTUM SURGERY      FAMILY HISTORY: Family History  Problem Relation Age of Onset  Colon cancer Father 41       Colon   Hypertension Father    Breast cancer Maternal Grandmother    Heart failure Mother    Osteoarthritis Mother    Depression Mother    Hypertension Mother    Stroke Mother    Asthma Mother    Cancer Paternal Grandmother        skin cancer, nose   Heart attack Paternal Grandfather    Cancer Maternal Grandfather        lung   Angioedema Neg Hx    Allergic rhinitis Neg Hx    Eczema Neg Hx    Urticaria Neg Hx     SOCIAL HISTORY: Social History   Socioeconomic History   Marital status: Married    Spouse name: Not on file   Number of children: 1   Years of education: BA   Highest education level: Not on file  Occupational History    Occupation: N/A  Tobacco Use   Smoking status: Former    Types: Cigarettes    Quit date: 04/25/1991    Years since quitting: 30.7   Smokeless tobacco: Never  Vaping Use   Vaping Use: Never used  Substance and Sexual Activity   Alcohol use: Yes    Alcohol/week: 6.0 standard drinks of alcohol    Types: 6 Standard drinks or equivalent per week    Comment: alcohol about 6 drinks a week   Drug use: No   Sexual activity: Yes    Partners: Male    Birth control/protection: Post-menopausal, Other-see comments    Comment: husband vasectomy  Other Topics Concern   Not on file  Social History Narrative   2 cups of coffee a day, occasional tea    Social Determinants of Health   Financial Resource Strain: Not on file  Food Insecurity: Not on file  Transportation Needs: Not on file  Physical Activity: Not on file  Stress: Not on file  Social Connections: Not on file  Intimate Partner Violence: Not on file      PHYSICAL EXAM Generalized: Well developed, in no acute distress   Neurological examination  Mentation: Alert oriented to time, place, history taking. Follows all commands speech and language fluent Cranial nerve II-XII:Extraocular movements were full. Facial symmetry noted. Head turning and shoulder shrug  were normal and symmetric.  DIAGNOSTIC DATA (LABS, IMAGING, TESTING) - I reviewed patient records, labs, notes, testing and imaging myself where available.  Lab Results  Component Value Date   WBC 5.4 10/29/2021   HGB 14.4 10/29/2021   HCT 42.0 10/29/2021   MCV 93.5 10/29/2021   PLT 313.0 10/29/2021      Component Value Date/Time   NA 133 (L) 10/29/2021 1339   NA 139 05/12/2014 0000   NA 138 04/18/2012 1346   K 4.1 10/29/2021 1339   K 4.0 04/18/2012 1346   CL 98 10/29/2021 1339   CL 104 04/18/2012 1346   CO2 27 10/29/2021 1339   CO2 25 04/18/2012 1346   GLUCOSE 105 (H) 10/29/2021 1339   GLUCOSE 93 04/18/2012 1346   BUN 9 10/29/2021 1339   BUN 10 05/12/2014  0000   BUN 17.2 04/18/2012 1346   CREATININE 0.87 10/29/2021 1339   CREATININE 1.0 04/18/2012 1346   CALCIUM 9.3 10/29/2021 1339   CALCIUM 9.1 04/18/2012 1346   PROT 6.7 10/29/2021 1339   PROT 6.8 04/18/2012 1346   ALBUMIN 4.1 10/29/2021 1339   ALBUMIN 3.6 04/18/2012 1346   AST 16  10/29/2021 1339   AST 15 04/18/2012 1346   ALT 10 10/29/2021 1339   ALT 12 04/18/2012 1346   ALKPHOS 64 10/29/2021 1339   ALKPHOS 78 04/18/2012 1346   BILITOT 0.5 10/29/2021 1339   BILITOT 0.38 04/18/2012 1346   Lab Results  Component Value Date   CHOL 214 (H) 10/29/2021   HDL 65.60 10/29/2021   LDLCALC 132 (H) 10/29/2021   TRIG 85.0 10/29/2021   CHOLHDL 3 10/29/2021   Lab Results  Component Value Date   HGBA1C 5.5 10/29/2021   Lab Results  Component Value Date   VITAMINB12 454 08/12/2014   Lab Results  Component Value Date   TSH 0.60 10/29/2021      ASSESSMENT AND PLAN 66 y.o. year old female  has a past medical history of Anemia, unspecified (04/18/2012), Anxiety, Bell's palsy, Bipolar 2 disorder ([facility]), Depression, History of chickenpox, Hypercholesteremia, Hypothyroidism (02/01/2012), Ischemic colitis ([facility]) (2010), Menopause, Migraine, OSA on CPAP, and Ulcerative colitis ([facility]) (02/01/2012). here with:  OSA on CPAP  CPAP compliance excellent Residual AHI is good Encouraged patient to continue using CPAP nightly and > 4 hours each night Order placed for home sleep test. Will order new machine pending results.  F/U after receiving new machine    Butch Penny, MSN, NP-C 01/18/2022, 12:44 PM [facility] [address], [address] [place], [place] [address] [phone]

## 2022-01-18 ENCOUNTER — Telehealth (INDEPENDENT_AMBULATORY_CARE_PROVIDER_SITE_OTHER): Payer: BC Managed Care – PPO | Admitting: Adult Health

## 2022-01-18 DIAGNOSIS — G4733 Obstructive sleep apnea (adult) (pediatric): Secondary | ICD-10-CM | POA: Diagnosis not present

## 2022-02-02 ENCOUNTER — Other Ambulatory Visit: Payer: Self-pay

## 2022-02-02 DIAGNOSIS — E039 Hypothyroidism, unspecified: Secondary | ICD-10-CM

## 2022-02-02 MED ORDER — LEVOTHYROXINE SODIUM 112 MCG PO TABS: ORAL_TABLET | ORAL | 3 refills | Status: DC

## 2022-02-07 ENCOUNTER — Encounter: Payer: Self-pay | Admitting: Physician Assistant

## 2022-02-07 NOTE — Telephone Encounter (Signed)
We would need to see you in the office to discuss hearing/ hearing loss before a referral could be processed. A specialist will not see you without US showing supporting documentation we have discussed it or seen you in reference to that particular issue. Please schedule an office visit or virtual visit to discuss.

## 2022-02-16 DIAGNOSIS — G4733 Obstructive sleep apnea (adult) (pediatric): Secondary | ICD-10-CM | POA: Diagnosis not present

## 2022-02-28 DIAGNOSIS — L309 Dermatitis, unspecified: Secondary | ICD-10-CM | POA: Diagnosis not present

## 2022-03-04 DIAGNOSIS — L235 Allergic contact dermatitis due to other chemical products: Secondary | ICD-10-CM | POA: Diagnosis not present

## 2022-03-21 ENCOUNTER — Other Ambulatory Visit: Payer: Self-pay | Admitting: Obstetrics and Gynecology

## 2022-03-21 DIAGNOSIS — Z1231 Encounter for screening mammogram for malignant neoplasm of breast: Secondary | ICD-10-CM

## 2022-03-22 ENCOUNTER — Telehealth: Payer: Self-pay | Admitting: Adult Health

## 2022-03-22 NOTE — Telephone Encounter (Signed)
HST- [employer] no auth req spoke to Adrienne Mocha ref # [id number]   Patient is scheduled at [facility] for 04/27/22 at 3 pm.  Mailed packet to the patient.

## 2022-04-02 ENCOUNTER — Encounter: Payer: Self-pay | Admitting: Physical Medicine and Rehabilitation

## 2022-04-05 ENCOUNTER — Other Ambulatory Visit: Payer: Self-pay | Admitting: Physical Medicine and Rehabilitation

## 2022-04-05 DIAGNOSIS — M5416 Radiculopathy, lumbar region: Secondary | ICD-10-CM

## 2022-04-14 ENCOUNTER — Other Ambulatory Visit: Payer: Self-pay

## 2022-04-14 ENCOUNTER — Ambulatory Visit: Payer: BC Managed Care – PPO | Admitting: Physical Medicine and Rehabilitation

## 2022-04-14 VITALS — BP 153/90 | HR 84

## 2022-04-14 DIAGNOSIS — M5416 Radiculopathy, lumbar region: Secondary | ICD-10-CM | POA: Diagnosis not present

## 2022-04-14 DIAGNOSIS — M47816 Spondylosis without myelopathy or radiculopathy, lumbar region: Secondary | ICD-10-CM

## 2022-04-14 DIAGNOSIS — G8929 Other chronic pain: Secondary | ICD-10-CM

## 2022-04-14 DIAGNOSIS — M48062 Spinal stenosis, lumbar region with neurogenic claudication: Secondary | ICD-10-CM

## 2022-04-14 MED ORDER — METHYLPREDNISOLONE ACETATE 80 MG/ML IJ SUSP
80.0000 mg | Freq: Once | INTRAMUSCULAR | Status: AC
  Administered 2022-04-14: 80 mg

## 2022-04-14 NOTE — Procedures (Signed)
Lumbar Epidural Steroid Injection - Interlaminar Approach with Fluoroscopic Guidance  Patient: Jessica Berger      Date of Birth: 10/09/1955 MRN: [id number] PCP: Fredirick Lathe, PA-C      Visit Date: 04/14/2022   Universal Protocol:     Consent Given By: the patient  Position: PRONE  Additional Comments: Vital signs were monitored before and after the procedure. Patient was prepped and draped in the usual sterile fashion. The correct patient, procedure, and site was verified.   Injection Procedure Details:   Procedure diagnoses: Lumbar radiculopathy [M54.16]   Meds Administered:  Meds ordered this encounter  Medications   methylPREDNISolone acetate (DEPO-MEDROL) injection 80 mg     Laterality: Right  Location/Site:  L5-S1  Needle: 3.5 in., 20 ga. Tuohy  Needle Placement: Paramedian epidural  Findings:   -Comments: Excellent flow of contrast into the epidural space.  Procedure Details: Using a paramedian approach from the side mentioned above, the region overlying the inferior lamina was localized under fluoroscopic visualization and the soft tissues overlying this structure were infiltrated with 4 ml. of 1% Lidocaine without Epinephrine. The Tuohy needle was inserted into the epidural space using a paramedian approach.   The epidural space was localized using loss of resistance along with counter oblique bi-planar fluoroscopic views.  After negative aspirate for air, blood, and CSF, a 2 ml. volume of Isovue-250 was injected into the epidural space and the flow of contrast was observed. Radiographs were obtained for documentation purposes.    The injectate was administered into the level noted above.   Additional Comments:  The patient tolerated the procedure well Dressing: 2 x 2 sterile gauze and Band-Aid    Post-procedure details: Patient was observed during the procedure. Post-procedure instructions were reviewed.  Patient left the clinic in stable  condition.

## 2022-04-14 NOTE — Progress Notes (Signed)
Functional Pain Scale - descriptive words and definitions  Mild (2)   Noticeable when not distracted/no impact on ADL's/sleep only slightly affected and able to   use both passive and active distraction for comfort. Mild range order  Average Pain  varies   +Driver, -BT, -Dye Allergies.  Lower back pain on right side with no radiation in leg

## 2022-04-14 NOTE — Patient Instructions (Signed)

## 2022-04-14 NOTE — Progress Notes (Signed)
Jessica Berger - 67 y.o. female MRN [id number]  Date of birth: August 12, 1955  Office Visit Note: Visit Date: 04/14/2022 PCP: Fredirick Lathe, PA-C Referred by: Allwardt, Randa Evens, PA-C  Subjective: Chief Complaint  Patient presents with   Lower Back - Pain   HPI:  Jessica Berger is a 67 y.o. female who comes in today for planned repeat Right L5-S1  Lumbar Interlaminar epidural steroid injection with fluoroscopic guidance.  The patient has failed conservative care including home exercise, medications, time and activity modification.  This injection will be diagnostic and hopefully therapeutic.  Please see requesting physician notes for further details and justification. Patient received more than 50% pain relief from prior injection.  She has had 2 prior epidurals now with almost 6 months or more relief with each 1.  By way of review did not really do well with the radiofrequency ablation.  No real high-grade stenosis.  Has multiple other medical problems but nothing severe and clinically doing well.  She does asked me today about her knees.  She has chronic bilateral knee pain.  No real evaluation for that.  I did make referral today to Dr. Elba Barman.  Referring: Barnet Pall, FNP   ROS Otherwise per HPI.  Assessment & Plan: Visit Diagnoses:    ICD-10-CM   1. Lumbar radiculopathy  M54.16 XR C-ARM NO REPORT    Epidural Steroid injection    methylPREDNISolone acetate (DEPO-MEDROL) injection 80 mg    2. Spinal stenosis of lumbar region with neurogenic claudication  M48.062     3. Spondylosis without myelopathy or radiculopathy, lumbar region  M47.816     4. Chronic pain of both knees  M25.561 Ambulatory referral to Sports Medicine   M25.562    G89.29       Plan: No additional findings.   Meds & Orders:  Meds ordered this encounter  Medications   methylPREDNISolone acetate (DEPO-MEDROL) injection 80 mg    Orders Placed This Encounter  Procedures   XR C-ARM NO REPORT    Ambulatory referral to Sports Medicine   Epidural Steroid injection    Follow-up: Return if symptoms worsen or fail to improve.   Procedures: No procedures performed  Lumbar Epidural Steroid Injection - Interlaminar Approach with Fluoroscopic Guidance  Patient: Jessica Berger      Date of Birth: 02-24-1955 MRN: [id number] PCP: Fredirick Lathe, PA-C      Visit Date: 04/14/2022   Universal Protocol:     Consent Given By: the patient  Position: PRONE  Additional Comments: Vital signs were monitored before and after the procedure. Patient was prepped and draped in the usual sterile fashion. The correct patient, procedure, and site was verified.   Injection Procedure Details:   Procedure diagnoses: Lumbar radiculopathy [M54.16]   Meds Administered:  Meds ordered this encounter  Medications   methylPREDNISolone acetate (DEPO-MEDROL) injection 80 mg     Laterality: Right  Location/Site:  L5-S1  Needle: 3.5 in., 20 ga. Tuohy  Needle Placement: Paramedian epidural  Findings:   -Comments: Excellent flow of contrast into the epidural space.  Procedure Details: Using a paramedian approach from the side mentioned above, the region overlying the inferior lamina was localized under fluoroscopic visualization and the soft tissues overlying this structure were infiltrated with 4 ml. of 1% Lidocaine without Epinephrine. The Tuohy needle was inserted into the epidural space using a paramedian approach.   The epidural space was localized using loss of resistance along with counter oblique bi-planar  fluoroscopic views.  After negative aspirate for air, blood, and CSF, a 2 ml. volume of Isovue-250 was injected into the epidural space and the flow of contrast was observed. Radiographs were obtained for documentation purposes.    The injectate was administered into the level noted above.   Additional Comments:  The patient tolerated the procedure well Dressing: 2 x 2 sterile gauze  and Band-Aid    Post-procedure details: Patient was observed during the procedure. Post-procedure instructions were reviewed.  Patient left the clinic in stable condition.   Clinical History: No specialty comments available.     Objective:  VS:  HT:    WT:   BMI:     BP:(!) 153/90  HR:84bpm  TEMP: ( )  RESP:  Physical Exam Vitals and nursing note reviewed.  Constitutional:      General: She is not in acute distress.    Appearance: Normal appearance. She is not ill-appearing.  HENT:     Head: Normocephalic and atraumatic.     Right Ear: External ear normal.     Left Ear: External ear normal.  Eyes:     Extraocular Movements: Extraocular movements intact.  Cardiovascular:     Rate and Rhythm: Normal rate.     Pulses: Normal pulses.  Pulmonary:     Effort: Pulmonary effort is normal. No respiratory distress.  Abdominal:     General: There is no distension.     Palpations: Abdomen is soft.  Musculoskeletal:        General: Tenderness present.     Cervical back: Neck supple.     Right lower leg: No edema.     Left lower leg: No edema.     Comments: Patient has good distal strength with no pain over the greater trochanters.  No clonus or focal weakness.  Skin:    Findings: No erythema, lesion or rash.  Neurological:     General: No focal deficit present.     Mental Status: She is alert and oriented to person, place, and time.     Sensory: No sensory deficit.     Motor: No weakness or abnormal muscle tone.     Coordination: Coordination normal.  Psychiatric:        Mood and Affect: Mood normal.        Behavior: Behavior normal.      Imaging: No results found.

## 2022-04-21 ENCOUNTER — Other Ambulatory Visit (INDEPENDENT_AMBULATORY_CARE_PROVIDER_SITE_OTHER): Payer: BC Managed Care – PPO

## 2022-04-21 ENCOUNTER — Encounter: Payer: Self-pay | Admitting: Sports Medicine

## 2022-04-21 ENCOUNTER — Ambulatory Visit: Payer: BC Managed Care – PPO | Admitting: Sports Medicine

## 2022-04-21 DIAGNOSIS — G8929 Other chronic pain: Secondary | ICD-10-CM | POA: Diagnosis not present

## 2022-04-21 DIAGNOSIS — M25562 Pain in left knee: Secondary | ICD-10-CM

## 2022-04-21 DIAGNOSIS — M25561 Pain in right knee: Secondary | ICD-10-CM | POA: Diagnosis not present

## 2022-04-21 DIAGNOSIS — M1710 Unilateral primary osteoarthritis, unspecified knee: Secondary | ICD-10-CM | POA: Diagnosis not present

## 2022-04-21 NOTE — Progress Notes (Signed)
Intermittent bilateral knee pain 2-3 months of pain Denies any swelling  Takes ibuprofen for pain which helps Going downstairs makes pain worse   Patient was instructed in 10 minutes of therapeutic exercises for bilateral knee pain to improve strength, ROM and function according to my instructions and plan of care by a Certified Athletic Trainer during the office visit. A customized handout was provided and demonstration of proper technique shown and discussed. Patient did perform exercises and demonstrate understanding through teachback.  All questions discussed and answered.

## 2022-04-21 NOTE — Progress Notes (Signed)
Jessica Berger - 67 y.o. female MRN [id number]  Date of birth: January 03, 1956  Office Visit Note: Visit Date: 04/21/2022 PCP: Fredirick Lathe, PA-C Referred by: Magnus Sinning, MD  Subjective: Chief Complaint  Patient presents with   Left Knee - Pain   Right Knee - Pain   HPI: Jessica Berger is a pleasant 67 y.o. female who presents today for chronic bilateral knee pain.  [place] has had pain in both knees over the last year or so, however over the last 2-3 months has been getting more noticeable.  Pain is over the anterior aspect of the knees.  It is worse with steps, specifically going downstairs makes her pain worse.  She denies any swelling.  No locking/clicking or catching.  She takes occasional ibuprofen which does help control her pain.  Also doing stretching and working with Edwena Felty for physiotherapy.  Pertinent ROS were reviewed with the patient and found to be negative unless otherwise specified above in HPI.   Assessment & Plan: Visit Diagnoses:  1. Patellofemoral arthritis   2. Chronic pain of left knee   3. Chronic pain of right knee    Plan: Discussed with Devonna that she is dealing with at least moderate patellofemoral arthritic change of the knees.  Fortunately her tibiofemoral joint spaces are relatively well-preserved.  We discussed all treatment options such as oral and topical medication therapy, formal versus home physical therapy, injection therapy.  Patient agreed to a home exercise based plan.  We did print out a customized handout for her patellofemoral pain and quad strengthening exercises, my athletic trainer, Lilia Pro, did review these with her in the room today.  She will perform these once daily.  She may use over-the-counter Tylenol or ibuprofen only as needed for her pain.  Would like to see how she does with the home exercises around the 6-week mark, she will message me and let me know how she is doing.  Follow-up as needed.  Follow-up: Return if symptoms  worsen or fail to improve.   Meds & Orders: No orders of the defined types were placed in this encounter.   Orders Placed This Encounter  Procedures   XR Knee Complete 4 Views Left   XR Knee Complete 4 Views Right     Procedures: No procedures performed      Clinical History: No specialty comments available.  She reports that she quit smoking about 31 years ago. Her smoking use included cigarettes. She has never used smokeless tobacco.  Recent Labs    10/29/21 1339  HGBA1C 5.5    Objective:   Vital Signs: LMP 01/31/2002 (Approximate)   Physical Exam  Gen: Well-appearing, in no acute distress; non-toxic CV:  Well-perfused. Warm.  Resp: Breathing unlabored on room air; no wheezing. Psych: Fluid speech in conversation; appropriate affect; normal thought process Neuro: Sensation intact throughout. No gross coordination deficits.   Ortho Exam -Bilateral knees: No redness, swelling or effusion.  Patient very mild medial joint space TTP right greater than left.  There is some patellar crepitus noted with flexion and extension.  Range of motion is preserved from 0-135 degrees.  No varus or valgus instability.  Ligamentously intact.  5/5 knee strength in all directions.  Imaging: XR Knee Complete 4 Views Right  Result Date: 04/21/2022 4 views of bilateral knees including standing AP, Rosenberg, lateral and sunrise views were ordered and reviewed by myself.  X-rays demonstrate relatively well-preserved medial and lateral tibiofemoral joint space.  There is at  least moderate patellofemoral arthritis of both knees.  Small superior patellar spur bilaterally.  There is mild spurring of the lateral femoral condyles.  No acute fracture or otherwise bony abnormality noted.  No joint effusion.  XR Knee Complete 4 Views Left  Result Date: 04/21/2022 4 views of bilateral knees including standing AP, Rosenberg, lateral and sunrise views were ordered and reviewed by myself.  X-rays demonstrate  relatively well-preserved medial and lateral tibiofemoral joint space.  There is at least moderate patellofemoral arthritis of both knees.  Small superior patellar spur bilaterally.  There is mild spurring of the lateral femoral condyles.  No acute fracture or otherwise bony abnormality noted.  No joint effusion.   Past Medical/Family/Surgical/Social History: Medications & Allergies reviewed per EMR, new medications updated. Patient Active Problem List   Diagnosis Date Noted   Mixed hyperlipidemia 10/29/2021   Vitamin D deficiency 10/29/2021   Bloating 09/28/2020   Gastroesophageal reflux disease 09/28/2020   Change in bowel habit 09/28/2020   Encounter for annual physical exam 09/28/2020   Diaphragmatic hernia 09/28/2020   Family history of malignant neoplasm of digestive organs 09/28/2020   Fatty liver 09/28/2020   Hiatal hernia 09/28/2020   Irritable bowel syndrome with diarrhea 09/28/2020   Morbid obesity ([place]) 99991111   Periumbilical pain 99991111   Personal history of colonic polyps 09/28/2020   Seasonal and perennial allergic rhinitis 05/18/2017   Easy bruising 07/18/2016   Hypothyroidism 07/15/2016   Bipolar 2 disorder ([place])    Ischemic colitis ([place])    Ulcerative colitis ([place]) 02/01/2012   Past Medical History:  Diagnosis Date   Anemia, unspecified 04/18/2012   Anxiety    Bell's palsy    Bipolar 2 disorder ([place])    Depression    History of chickenpox    Hypercholesteremia    Hypothyroidism 02/01/2012   Ischemic colitis ([place]) 2010   Menopause    Migraine    OSA on CPAP    Ulcerative colitis ([place]) 02/01/2012   Family History  Problem Relation Age of Onset   Colon cancer Father 30       Colon   Hypertension Father    Breast cancer Maternal Grandmother    Heart failure Mother    Osteoarthritis Mother    Depression Mother    Hypertension Mother    Stroke Mother    Asthma Mother    Cancer Paternal Grandmother        skin cancer, nose   Heart attack Paternal  Grandfather    Cancer Maternal Grandfather        lung   Angioedema Neg Hx    Allergic rhinitis Neg Hx    Eczema Neg Hx    Urticaria Neg Hx    Past Surgical History:  Procedure Laterality Date   CATARACT EXTRACTION W/ INTRAOCULAR LENS IMPLANT Left 03/14/2016   CATARACT EXTRACTION W/ INTRAOCULAR LENS IMPLANT Right 04/18/2016   NASAL SEPTUM SURGERY     Social History   Occupational History   Occupation: N/A  Tobacco Use   Smoking status: Former    Types: Cigarettes    Quit date: 04/25/1991    Years since quitting: 31.0   Smokeless tobacco: Never  Vaping Use   Vaping Use: Never used  Substance and Sexual Activity   Alcohol use: Yes    Alcohol/week: 6.0 standard drinks of alcohol    Types: 6 Standard drinks or equivalent per week    Comment: alcohol about 6 drinks a week   Drug use: No  Sexual activity: Yes    Partners: Male    Birth control/protection: Post-menopausal, Other-see comments    Comment: husband vasectomy

## 2022-04-27 ENCOUNTER — Ambulatory Visit: Payer: BC Managed Care – PPO | Admitting: Neurology

## 2022-04-27 DIAGNOSIS — G4733 Obstructive sleep apnea (adult) (pediatric): Secondary | ICD-10-CM

## 2022-04-28 NOTE — Progress Notes (Signed)
See procedure note.

## 2022-05-02 NOTE — Procedures (Signed)
   [facility]  HOME SLEEP TEST (Watch PAT) REPORT  STUDY DATE: 04/27/2022  DOB: Sep 13, 1955  MRN: [id number]  ORDERING CLINICIAN: Star Age, MD, PhD   REFERRING CLINICIAN: Ward Givens, NP  CLINICAL INFORMATION/HISTORY: 67 year old female with an underlying medical history of anemia, hyperlipidemia, hypothyroidism, ischemic colitis, migraine headaches, ulcerative colitis, anxiety, depression, and mild obesity, who presents for reevaluation of her sleep apnea.  She has been on CPAP of 8 cm with compliance.  She qualifies for new equipment.  BMI: 32.4 kg/m  FINDINGS:   Sleep Summary:   Total Recording Time (hours, min): 9 hours, 42 min  Total Sleep Time (hours, min):  8 hours, 21 min  Percent REM (%):    21.3%   Respiratory Indices:   Calculated pAHI (per hour):  14.3/hour         REM pAHI:    30.2/hour       NREM pAHI: 10/hour  Central pAHI: 0/hour  Oxygen Saturation Statistics:    Oxygen Saturation (%) Mean: 92%   Minimum oxygen saturation (%):                 86%   O2 Saturation Range (%): 86 - 99%    O2 Saturation (minutes) <=88%: 0.7 min  Pulse Rate Statistics:   Pulse Mean (bpm):    58/min    Pulse Range (49 - 94/min)   IMPRESSION: OSA (obstructive sleep apnea)   RECOMMENDATION:  This home sleep test demonstrates overall mild obstructive sleep apnea with a total AHI of 14.3/hour and O2 nadir of 86%. Snoring was detected, ranging from mild to louder, at times intermittent.  The patient has been compliant with CPAP of 8 cm with good apnea control.  She qualifies for new equipment and I recommend prescribing a set pressure of 8 cm with EPR of 0 or as per patient preference. Alternative treatments may include weight loss (where appropriate) along with avoidance of the supine sleep position (if possible), or an oral appliance in appropriate candidates.   Please note that untreated obstructive sleep apnea may carry additional  perioperative morbidity. Patients with significant obstructive sleep apnea should receive perioperative PAP therapy and the surgeons and particularly the anesthesiologist should be informed of the diagnosis and the severity of the sleep disordered breathing. The patient should be cautioned not to drive, work at heights, or operate dangerous or heavy equipment when tired or sleepy. Review and reiteration of good sleep hygiene measures should be pursued with any patient. Other causes of the patient's symptoms, including circadian rhythm disturbances, an underlying mood disorder, medication effect and/or an underlying medical problem cannot be ruled out based on this test. Clinical correlation is recommended.  The patient and her referring provider will be notified of the test results. The patient will be seen in follow up in sleep clinic at [facility], as necessary.  I certify that I have reviewed the raw data recording prior to the issuance of this report in accordance with the standards of the American Academy of Sleep Medicine (AASM).  INTERPRETING PHYSICIAN:   Star Age, MD, PhD Medical Director, [place] [facility] [place] [facility] [place], ABPN (Neurology and Sleep)   [facility] [address] [place], [place] [address] [phone]

## 2022-05-03 ENCOUNTER — Ambulatory Visit
Admission: RE | Admit: 2022-05-03 | Discharge: 2022-05-03 | Disposition: A | Discharge status: Home / Self Care | Payer: BC Managed Care – PPO | Source: Ambulatory Visit | Attending: Obstetrics and Gynecology | Admitting: Obstetrics and Gynecology

## 2022-05-03 DIAGNOSIS — Z1231 Encounter for screening mammogram for malignant neoplasm of breast: Secondary | ICD-10-CM | POA: Diagnosis not present

## 2022-05-04 NOTE — Addendum Note (Signed)
Addended by: Star Age on: 05/04/2022 04:20 PM   Modules accepted: Orders

## 2022-05-05 ENCOUNTER — Telehealth: Payer: Self-pay | Admitting: *Deleted

## 2022-05-05 NOTE — Telephone Encounter (Signed)
I called pt and let her know that her sleep study did show mild OSA. She is to maintain use of cpap, new order will be sent to [facility].  She will call us to set up new appt after she gets her machine.  (Using 4 hours each night for compliance).  She verbalized understanding.   Order sent to [facility] with response back.

## 2022-05-05 NOTE — Telephone Encounter (Signed)
-----   Message from Jessica Age, MD sent at 05/04/2022  4:20 PM EDT ----- Patient had HST for re-eval of OSA and to qualify for a new machine. Please advise patient that her HST showed mild OSA. We can maintain treatment with a CPAP machine; I put an order in for a new machine. Please advise patient to continue to be compliant with treatment. She will need a FU in sleep clinic to see MM or me in 1-3 months post set up date.

## 2022-05-09 NOTE — Telephone Encounter (Signed)
New, Doristine Mango, RN; Milon Dikes; Marveen Reeks Received, Thank you!     Previous Messages    ----- Message ----- From: Guy Begin, RN Sent: 05/05/2022   3:56 PM EDT To: Henderson Newcomer; Kathyrn Sheriff; Santina Evans; * Subject: new machine                                    Good afternoon,  New order in EPIC for new machine.    Jessica Berger Female, 67 y.o., 01-23-1956 Pronouns: she/her/hers MRN: [id number] Phone: [phone]   [facility] RN

## 2022-05-19 DIAGNOSIS — G4733 Obstructive sleep apnea (adult) (pediatric): Secondary | ICD-10-CM | POA: Diagnosis not present

## 2022-06-18 DIAGNOSIS — G4733 Obstructive sleep apnea (adult) (pediatric): Secondary | ICD-10-CM | POA: Diagnosis not present

## 2022-07-11 DIAGNOSIS — G4733 Obstructive sleep apnea (adult) (pediatric): Secondary | ICD-10-CM | POA: Diagnosis not present

## 2022-07-19 DIAGNOSIS — G4733 Obstructive sleep apnea (adult) (pediatric): Secondary | ICD-10-CM | POA: Diagnosis not present

## 2022-08-09 DIAGNOSIS — F410 Panic disorder [episodic paroxysmal anxiety] without agoraphobia: Secondary | ICD-10-CM | POA: Diagnosis not present

## 2022-08-09 DIAGNOSIS — F3181 Bipolar II disorder: Secondary | ICD-10-CM | POA: Diagnosis not present

## 2022-08-10 DIAGNOSIS — G4733 Obstructive sleep apnea (adult) (pediatric): Secondary | ICD-10-CM | POA: Diagnosis not present

## 2022-08-18 DIAGNOSIS — G4733 Obstructive sleep apnea (adult) (pediatric): Secondary | ICD-10-CM | POA: Diagnosis not present

## 2022-09-18 DIAGNOSIS — G4733 Obstructive sleep apnea (adult) (pediatric): Secondary | ICD-10-CM | POA: Diagnosis not present

## 2022-10-04 ENCOUNTER — Ambulatory Visit: Payer: BC Managed Care – PPO | Admitting: Obstetrics and Gynecology

## 2022-10-06 ENCOUNTER — Encounter: Payer: Self-pay | Admitting: Physical Medicine and Rehabilitation

## 2022-10-07 ENCOUNTER — Other Ambulatory Visit: Payer: Self-pay | Admitting: Physical Medicine and Rehabilitation

## 2022-10-07 DIAGNOSIS — M5416 Radiculopathy, lumbar region: Secondary | ICD-10-CM

## 2022-10-10 DIAGNOSIS — G4733 Obstructive sleep apnea (adult) (pediatric): Secondary | ICD-10-CM | POA: Diagnosis not present

## 2022-10-11 DIAGNOSIS — G4733 Obstructive sleep apnea (adult) (pediatric): Secondary | ICD-10-CM | POA: Diagnosis not present

## 2022-10-12 ENCOUNTER — Encounter: Payer: Self-pay | Admitting: Physician Assistant

## 2022-10-12 ENCOUNTER — Ambulatory Visit: Payer: BC Managed Care – PPO | Admitting: Physician Assistant

## 2022-10-12 VITALS — BP 129/86 | HR 73 | Temp 97.8°F | Ht 66.5 in | Wt 216.0 lb

## 2022-10-12 DIAGNOSIS — H9193 Unspecified hearing loss, bilateral: Secondary | ICD-10-CM | POA: Diagnosis not present

## 2022-10-12 DIAGNOSIS — E039 Hypothyroidism, unspecified: Secondary | ICD-10-CM

## 2022-10-12 MED ORDER — LEVOTHYROXINE SODIUM 112 MCG PO TABS
ORAL_TABLET | ORAL | 0 refills | Status: DC
Start: 2022-10-12 — End: 2023-01-10

## 2022-10-12 NOTE — Progress Notes (Signed)
Subjective:    Patient ID: Jessica Berger, female    DOB: Sep 21, 1955, 67 y.o.   MRN: [id number]  Chief Complaint  Patient presents with   Hearing Problem   Medication Refill    Pt states that she can hear people talking, but cannot make out the words, has been unable to watch TV without closed caption, x48yrs,     Medication Refill   Patient is in today for decline in hearing L > R ear x 2 years, progressively worsening. Often saying "huh" and asking family to repeat themselves. Watching TV with closed captioning. Denies ear pain, ringing, discharge, headaches, vertigo. No hx wax. No fam hx hearing loss.   Past Medical History:  Diagnosis Date   Anemia, unspecified 04/18/2012   Anxiety    Bell's palsy    Bipolar 2 disorder ([facility])    Depression    History of chickenpox    Hypercholesteremia    Hypothyroidism 02/01/2012   Ischemic colitis ([facility]) 2010   Menopause    Migraine    OSA on CPAP    Ulcerative colitis ([facility]) 02/01/2012    Past Surgical History:  Procedure Laterality Date   CATARACT EXTRACTION W/ INTRAOCULAR LENS IMPLANT Left 03/14/2016   CATARACT EXTRACTION W/ INTRAOCULAR LENS IMPLANT Right 04/18/2016   NASAL SEPTUM SURGERY      Family History  Problem Relation Age of Onset   Colon cancer Father 31       Colon   Hypertension Father    Breast cancer Maternal Grandmother    Heart failure Mother    Osteoarthritis Mother    Depression Mother    Hypertension Mother    Stroke Mother    Asthma Mother    Cancer Paternal Grandmother        skin cancer, nose   Heart attack Paternal Grandfather    Cancer Maternal Grandfather        lung   Angioedema Neg Hx    Allergic rhinitis Neg Hx    Eczema Neg Hx    Urticaria Neg Hx     Social History   Tobacco Use   Smoking status: Former    Current packs/day: 0.00    Types: Cigarettes    Quit date: 04/25/1991    Years since quitting: 31.4   Smokeless tobacco: Never  Vaping Use   Vaping status: Never Used  Substance  Use Topics   Alcohol use: Yes    Alcohol/week: 6.0 standard drinks of alcohol    Types: 6 Standard drinks or equivalent per week    Comment: alcohol about 6 drinks a week   Drug use: No     Allergies  Allergen Reactions   Demerol [Meperidine]     migraines   Penicillins Hives   Sulfa Antibiotics Hives   Xylocaine [Lidocaine] Other (See Comments)    Doesn't numb    Review of Systems NEGATIVE UNLESS OTHERWISE INDICATED IN HPI      Objective:     BP 129/86   Pulse 73   Temp 97.8 F (36.6 C) (Temporal)   Ht 5' 6.5" (1.689 m)   Wt 216 lb (98 kg)   LMP 01/31/2002 (Approximate)   SpO2 98%   BMI 34.34 kg/m   Wt Readings from Last 3 Encounters:  10/12/22 216 lb (98 kg)  10/29/21 203 lb 12.8 oz (92.4 kg)  09/29/21 204 lb (92.5 kg)    BP Readings from Last 3 Encounters:  10/12/22 129/86  04/14/22 (!) 153/90  10/29/21 Marland Kitchen)  142/82     Physical Exam Vitals and nursing note reviewed.  Constitutional:      Appearance: Normal appearance.  HENT:     Right Ear: Tympanic membrane, ear canal and external ear normal. There is no impacted cerumen.     Left Ear: Tympanic membrane, ear canal and external ear normal. There is no impacted cerumen.     Ears:     Comments: Failed on-site hearing test bilaterally  Cardiovascular:     Rate and Rhythm: Normal rate and regular rhythm.     Pulses: Normal pulses.     Heart sounds: No murmur heard. Pulmonary:     Effort: Pulmonary effort is normal.     Breath sounds: Normal breath sounds.  Neurological:     Mental Status: She is alert.  Psychiatric:        Mood and Affect: Mood normal.        Assessment & Plan:  Hearing decreased, bilateral -     Ambulatory referral to Audiology  Hypothyroidism, unspecified type -     Levothyroxine Sodium; TAKE 1 TABLET DAILY (NEED APPOINTMENT FOR FUTURE REFILLS)  Dispense: 90 tablet; Refill: 0       Return as scheduled.     Quatavious Rossa M Meliah Appleman, PA-C

## 2022-10-13 ENCOUNTER — Encounter: Payer: Self-pay | Admitting: Obstetrics and Gynecology

## 2022-10-13 ENCOUNTER — Ambulatory Visit (INDEPENDENT_AMBULATORY_CARE_PROVIDER_SITE_OTHER): Payer: BC Managed Care – PPO | Admitting: Obstetrics and Gynecology

## 2022-10-13 ENCOUNTER — Other Ambulatory Visit (HOSPITAL_COMMUNITY)
Admission: RE | Admit: 2022-10-13 | Discharge: 2022-10-13 | Disposition: A | Discharge status: Home / Self Care | Payer: BC Managed Care – PPO | Source: Ambulatory Visit | Attending: Obstetrics and Gynecology | Admitting: Obstetrics and Gynecology

## 2022-10-13 ENCOUNTER — Other Ambulatory Visit: Payer: Self-pay | Admitting: Physical Medicine and Rehabilitation

## 2022-10-13 VITALS — BP 112/64 | HR 71 | Ht 65.75 in | Wt 215.0 lb

## 2022-10-13 DIAGNOSIS — Z01419 Encounter for gynecological examination (general) (routine) without abnormal findings: Secondary | ICD-10-CM

## 2022-10-13 NOTE — Progress Notes (Signed)
67 y.o. y.o. female here for annual exam. She denies any PM bleeding.   Health Maintenance: Pap:  09/04/18 Wnl Hr HPV Neg 05/12/15 Neg Hr HPV Neg  History of abnormal Pap:  no MMG:  05/04/22 Bi-rads one neg.  BMD:   Scheduled for September, 7, 2023 Colonoscopy: 9/22 with Dr Loreta Ave, polyps.  F/u 9/25 FATHER HAD COLON CANCER.  She does screening q 3 years. TDaP:  2014 Gardasil: n/a  Pelvic discharge: denies Pelvic pain: denies Denies any GI or GU complaints.  Blood pressure 112/64, pulse 71, height 5' 5.75" (1.67 m), weight 215 lb (97.5 kg), last menstrual period 01/31/2002, SpO2 99%.     Component Value Date/Time   DIAGPAP  09/04/2018 0000    NEGATIVE FOR INTRAEPITHELIAL LESIONS OR MALIGNANCY.   ADEQPAP  09/04/2018 0000    Satisfactory for evaluation  endocervical/transformation zone component PRESENT.    GYN HISTORY:    Component Value Date/Time   DIAGPAP  09/04/2018 0000    NEGATIVE FOR INTRAEPITHELIAL LESIONS OR MALIGNANCY.   ADEQPAP  09/04/2018 0000    Satisfactory for evaluation  endocervical/transformation zone component PRESENT.    OB History  Gravida Para Term Preterm AB Living  1 1 1  0 0 1  SAB IAB Ectopic Multiple Live Births  0 0 0 0 1    # Outcome Date GA Lbr Len/2nd Weight Sex Type Anes PTL Lv  1 Term 03/06/92 [redacted]w[redacted]d  7 lb 11 oz (3.487 kg) F Vag-Spont EPI N LIV    Past Medical History:  Diagnosis Date   Anemia, unspecified 04/18/2012   Anxiety    Bell's palsy    Bipolar 2 disorder (HCC)    Depression    Hearing loss on left    History of chickenpox    Hypercholesteremia    Hypothyroidism 02/01/2012   Ischemic colitis (HCC) 2010   Menopause    Migraine    OSA on CPAP    Ulcerative colitis (HCC) 02/01/2012    Past Surgical History:  Procedure Laterality Date   CATARACT EXTRACTION W/ INTRAOCULAR LENS IMPLANT Left 03/14/2016   CATARACT EXTRACTION W/ INTRAOCULAR LENS IMPLANT Right 04/18/2016   NASAL SEPTUM SURGERY      Current Outpatient  Medications on File Prior to Visit  Medication Sig Dispense Refill   Cholecalciferol (VITAMIN D PO) Take by mouth.     clonazePAM (KLONOPIN) 1 MG tablet Take by mouth.     fexofenadine (ALLEGRA) 180 MG tablet Take 180 mg by mouth daily.     fluticasone (FLONASE) 50 MCG/ACT nasal spray Place into both nostrils daily.     lamoTRIgine (LAMICTAL) 100 MG tablet      LATUDA 80 MG TABS tablet Take 1 tablet by mouth daily.     levothyroxine (SYNTHROID) 112 MCG tablet TAKE 1 TABLET DAILY (NEED APPOINTMENT FOR FUTURE REFILLS) 90 tablet 0   meloxicam (MOBIC) 15 MG tablet TAKE 1 TABLET BY MOUTH EVERY DAY WITH FOOD 30 tablet 0   NEXIUM 40 MG capsule Take 40 mg by mouth daily at 12 noon.      propranolol ER (INDERAL LA) 80 MG 24 hr capsule Take 80 mg by mouth daily.     Rhubarb (ESTROVEN MENOPAUSE RELIEF PO) Take 1 tablet by mouth daily.     vitamin B-12 (CYANOCOBALAMIN) 1000 MCG tablet Take 1,000 mcg by mouth daily.     No current facility-administered medications on file prior to visit.    Social History   Socioeconomic History  Marital status: Married    Spouse name: Not on file   Number of children: 1   Years of education: BA   Highest education level: Bachelor's degree (e.g., BA, AB, BS)  Occupational History   Occupation: N/A  Tobacco Use   Smoking status: Former    Current packs/day: 0.00    Types: Cigarettes    Quit date: 04/25/1991    Years since quitting: 31.4   Smokeless tobacco: Never  Vaping Use   Vaping status: Never Used  Substance and Sexual Activity   Alcohol use: Yes    Alcohol/week: 6.0 standard drinks of alcohol    Types: 6 Standard drinks or equivalent per week    Comment: alcohol about 6 drinks a week   Drug use: No   Sexual activity: Yes    Partners: Male    Birth control/protection: Post-menopausal, Other-see comments    Comment: husband vasectomy  Other Topics Concern   Not on file  Social History Narrative   2 cups of coffee a day, occasional tea     Social Determinants of Health   Financial Resource Strain: Low Risk  (10/08/2022)   Overall Financial Resource Strain (CARDIA)    Difficulty of Paying Living Expenses: Not hard at all  Food Insecurity: No Food Insecurity (10/08/2022)   Hunger Vital Sign    Worried About Running Out of Food in the Last Year: Never true    Ran Out of Food in the Last Year: Never true  Transportation Needs: No Transportation Needs (10/08/2022)   PRAPARE - Administrator, Civil Service (Medical): No    Lack of Transportation (Non-Medical): No  Physical Activity: Insufficiently Active (10/08/2022)   Exercise Vital Sign    Days of Exercise per Week: 3 days    Minutes of Exercise per Session: 40 min  Stress: No Stress Concern Present (10/08/2022)   [employer] of Occupational Health - Occupational Stress Questionnaire    Feeling of Stress : Only a little  Social Connections: Moderately Isolated (10/08/2022)   Social Connection and Isolation Panel [NHANES]    Frequency of Communication with Friends and Family: Three times a week    Frequency of Social Gatherings with Friends and Family: Once a week    Attends Religious Services: Never    Database administrator or Organizations: No    Attends Engineer, structural: Not on file    Marital Status: Married  Catering manager Violence: Not on file    Family History  Problem Relation Age of Onset   Heart failure Mother    Osteoarthritis Mother    Depression Mother    Hypertension Mother    Stroke Mother    Asthma Mother    Colon cancer Father 33       Colon   Hypertension Father    Breast cancer Maternal Grandmother    Cancer Maternal Grandfather        lung   Cancer Paternal Grandmother        skin cancer, nose   Heart attack Paternal Grandfather    Urticaria Neg Hx      Allergies  Allergen Reactions   Demerol [Meperidine]     migraines   Penicillins Hives   Sulfa Antibiotics Hives      Patient's last menstrual period  was Patient's last menstrual period was 01/31/2002 (approximate)..            Review of Systems Alls systems reviewed and are negative.  PE General appearance: alert, cooperative and appears stated age Head: Normocephalic, without obvious abnormality, atraumatic Neck: no adenopathy, supple, symmetrical, trachea midline and thyroid normal to inspection and palpation Lungs: clear to auscultation bilaterally Breasts: normal appearance, no masses or tenderness Heart: regular rate and rhythm Abdomen: soft, non-tender; bowel sounds normal; no masses,  no organomegaly Extremities: extremities normal, atraumatic, no cyanosis or edema Skin: Skin color, texture, turgor normal. No rashes or lesions Lymph nodes: Cervical, supraclavicular, and axillary nodes normal. No abnormal inguinal nodes palpated Neurologic: Grossly normal     Pelvic: External genitalia:  no lesions              Urethra:  normal appearing urethra with no masses, tenderness or lesions              Bartholins and Skenes: normal                 Vagina: normal appearing vagina with normal color and discharge, no lesions.               Cervix: no lesions, no cervical motion tenderness               Bimanual Exam:  Uterus:  normal size, contour, position, consistency, mobility, non-tender              Adnexa: no mass, fullness, tenderness          Chaperone was present for exam.   A:         Well Woman GYN exam                             P:        Pap smear collected.  Can repeat in 4-5 years. Patient is low risk with no abnormal pap smaers             Encouraged annual mammogram screening             Colonoscopy UTD             Labs and immunizations with her primary             Encouraged calcium and Vit D  Earley Favor

## 2022-10-19 DIAGNOSIS — G4733 Obstructive sleep apnea (adult) (pediatric): Secondary | ICD-10-CM | POA: Diagnosis not present

## 2022-10-21 LAB — CYTOLOGY - PAP: Diagnosis: NEGATIVE

## 2022-10-24 ENCOUNTER — Other Ambulatory Visit: Payer: Self-pay

## 2022-10-24 ENCOUNTER — Ambulatory Visit: Payer: BC Managed Care – PPO | Admitting: Physical Medicine and Rehabilitation

## 2022-10-24 VITALS — BP 173/89 | HR 73

## 2022-10-24 DIAGNOSIS — M545 Low back pain, unspecified: Secondary | ICD-10-CM

## 2022-10-24 DIAGNOSIS — M5416 Radiculopathy, lumbar region: Secondary | ICD-10-CM

## 2022-10-24 DIAGNOSIS — G8929 Other chronic pain: Secondary | ICD-10-CM | POA: Diagnosis not present

## 2022-10-24 DIAGNOSIS — M25551 Pain in right hip: Secondary | ICD-10-CM

## 2022-10-24 DIAGNOSIS — M47816 Spondylosis without myelopathy or radiculopathy, lumbar region: Secondary | ICD-10-CM

## 2022-10-24 MED ORDER — METHYLPREDNISOLONE ACETATE 80 MG/ML IJ SUSP
80.0000 mg | Freq: Once | INTRAMUSCULAR | Status: AC
  Administered 2022-10-24: 80 mg

## 2022-10-24 NOTE — Patient Instructions (Signed)

## 2022-10-24 NOTE — Progress Notes (Signed)
Functional Pain Scale - descriptive words and definitions  Uncomfortable (3)  Pain is present but can complete all ADL's/sleep is slightly affected and passive distraction only gives marginal relief. Mild range order  Average Pain  varies on activity   +Driver, -BT, -Dye Allergies.  Lower back pain on right side with no radiation in the legs. Standing and walking for long periods of time makes pain worse

## 2022-10-25 ENCOUNTER — Ambulatory Visit: Payer: BC Managed Care – PPO | Attending: Physician Assistant | Admitting: Audiologist

## 2022-10-25 DIAGNOSIS — H903 Sensorineural hearing loss, bilateral: Secondary | ICD-10-CM | POA: Insufficient documentation

## 2022-10-26 NOTE — Procedures (Signed)
Outpatient Audiology and [facility] [address] [place], [place]  [address] [phone]  AUDIOLOGICAL  EVALUATION  NAME: Jessica Berger     DOB:   1955-04-17      MRN: [id number]                                                                                     DATE: 10/26/2022     REFERENT: Bary Leriche, PA-C STATUS: Outpatient DIAGNOSIS: Mild sensorineural hearing loss bilateral   History: Adarah was seen for an audiological evaluation due to difficulty hearing her husband.  She referred on a hearing screening with her primary care doctor.  She denies any pain or pressure in either ear.  She feels that 1 ear is worse than the other.  She has occasional ringing.  She has never had a hearing test before.  She had no significant medical history.  Her difficulty hearing is causing a significant impact on her ability to communicate day today.  Evaluation:  Otoscopy showed a clear view of the tympanic membranes, bilaterally Tympanometry results were consistent with normal middle ear function, bilaterally Audiometric testing was completed using Conventional Audiometry techniques with insert earphones and insert headphones. Test results are consistent with mild low-frequency sensorineural hearing loss bilaterally rising to normal by 4K hertz. Speech Recognition Thresholds were obtained at 35dB HL in the right ear and at 35 dB HL in the left ear. Word Recognition Testing was completed at 75 dB HL and Shreya scored 100% in each ear  Results:  The test results were reviewed with Synetta Fail.  She has a mild low-frequency to mid frequency sensorineural hearing loss in both ears.  She is not a standard hearing aid candidate due to the very mild degree of the loss.  However since this is impacting her on a daily basis that she is welcome to try hearing aids to see if this improves her quality of life.  She was provided with a list of local hearing aid providers.  Recommendations: 1.    Hearing aid trial recommended for both ears due to impact of mild hearing loss on the day today communication  30 minutes spent testing and counseling on results.   If you have any questions please feel free to contact me at [phone].  Ammie Ferrier Audiologist, Au.D., CCC-A 10/26/2022  9:27 AM  Cc: Allwardt, Crist Infante, PA-C

## 2022-10-28 ENCOUNTER — Encounter: Payer: Self-pay | Admitting: Physical Medicine and Rehabilitation

## 2022-10-28 NOTE — Procedures (Signed)
Lumbar Epidural Steroid Injection - Interlaminar Approach with Fluoroscopic Guidance  Patient: Jessica Berger      Date of Birth: 07-Jan-1956 MRN: [id number] PCP: Bary Leriche, PA-C      Visit Date: 10/24/2022   Universal Protocol:     Consent Given By: the patient  Position: PRONE  Additional Comments: Vital signs were monitored before and after the procedure. Patient was prepped and draped in the usual sterile fashion. The correct patient, procedure, and site was verified.   Injection Procedure Details:   Procedure diagnoses: Lumbar radiculopathy [M54.16]   Meds Administered:  Meds ordered this encounter  Medications   methylPREDNISolone acetate (DEPO-MEDROL) injection 80 mg     Laterality: Right  Location/Site:  L5-S1  Needle: 4.5 in., 20 ga. Tuohy  Needle Placement: Paramedian epidural  Findings:   -Comments: Excellent flow of contrast into the epidural space.  Procedure Details: Using a paramedian approach from the side mentioned above, the region overlying the inferior lamina was localized under fluoroscopic visualization and the soft tissues overlying this structure were infiltrated with 4 ml. of 1% Lidocaine without Epinephrine. The Tuohy needle was inserted into the epidural space using a paramedian approach.   The epidural space was localized using loss of resistance along with counter oblique bi-planar fluoroscopic views.  After negative aspirate for air, blood, and CSF, a 2 ml. volume of Isovue-250 was injected into the epidural space and the flow of contrast was observed. Radiographs were obtained for documentation purposes.    The injectate was administered into the level noted above.   Additional Comments:  No complications occurred Dressing: 2 x 2 sterile gauze and Band-Aid    Post-procedure details: Patient was observed during the procedure. Post-procedure instructions were reviewed.  Patient left the clinic in stable condition.

## 2022-10-28 NOTE — Progress Notes (Signed)
Jessica Berger - 68 y.o. female MRN [id number]  Date of birth: 1955-02-27  Office Visit Note: Visit Date: 10/24/2022 PCP: Bary Leriche, PA-C Referred by: Allwardt, Crist Infante, PA-C  Subjective: Chief Complaint  Patient presents with   Lower Back - Pain   HPI: Jessica Berger is a 67 y.o. female who comes in today for evaluation and management for chronic and worsening now and severe once again low back and right low back and right hip pain.  The last time we saw her was in March and she did well with epidural injection.  Her brief history is that she has had more findings of facet arthropathy and spondylosis of the lumbar spine but did not really respond to the radiofrequency ablation procedure that we performed.  She did well with diagnostic facet blocks but not with the ablation.  She initially was a patient of Dr. Prince Rome when he was here.  We have since completed epidural injection that seems to help for this right sided low back and hip pain which likely could be from lateral recess narrowing even though its fairly mild.  No high-grade stenosis.  Since we have seen her she has had no falls or trauma.  No other new findings.  No paresthesias or weakness.  No bowel or bladder issues.  She has chronic irritable bowel and colitis but not anything related to the spine.  Her case is complicated by hypothyroidism and bipolar disorder.  She has had physical therapy in the past and continues to try to stay active.  We had also sent her to Dr. Shon Baton to evaluate her knees.  She has been doing exercises for the knees and she can follow-up with him as needed for that.  He determined she did not have any high-grade arthritis of the knees.  Rates her pain and function scale is only a 3 out of 10 and is being uncomfortable because she can still do activities of daily living.  Average pain however can vary depending on her activity level.  She gets worsening with standing and walking for long periods of time.   Clinically reasonable sounding claudication type symptoms in the buttock.     Review of Systems  Musculoskeletal:  Positive for back pain and joint pain.  All other systems reviewed and are negative.  Otherwise per HPI.  Assessment & Plan: Visit Diagnoses:    ICD-10-CM   1. Lumbar radiculopathy  M54.16 XR C-ARM NO REPORT    Epidural Steroid injection    methylPREDNISolone acetate (DEPO-MEDROL) injection 80 mg    2. Spondylosis without myelopathy or radiculopathy, lumbar region  M47.816     3. Chronic right-sided low back pain without sciatica  M54.50    G89.29     4. Pain in right hip  M25.551        Plan: Findings:  1.  Chronic low back pain apparently right sided some referral into the hip and upper buttock region but not in the groin or down the leg.  Nonetheless did not respond to radiofrequency ablation of the facet joints but has responded to epidural injections in the past.  Has had all manner of conservative care and medication management without relief.  Last injection did seem to help and she feels like it is pretty similar at this point.  Will repeat the epidural injection today.  Consider updating lumbar spine imaging at some point.  Consider regrouping with physical therapy if needed.  2.  Bilateral knee  Jessica Berger - 68 y.o. female MRN [id number]  Date of birth: 1955-02-27  Office Visit Note: Visit Date: 10/24/2022 PCP: Bary Leriche, PA-C Referred by: Allwardt, Crist Infante, PA-C  Subjective: Chief Complaint  Patient presents with   Lower Back - Pain   HPI: Jessica Berger is a 67 y.o. female who comes in today for evaluation and management for chronic and worsening now and severe once again low back and right low back and right hip pain.  The last time we saw her was in March and she did well with epidural injection.  Her brief history is that she has had more findings of facet arthropathy and spondylosis of the lumbar spine but did not really respond to the radiofrequency ablation procedure that we performed.  She did well with diagnostic facet blocks but not with the ablation.  She initially was a patient of Dr. Prince Rome when he was here.  We have since completed epidural injection that seems to help for this right sided low back and hip pain which likely could be from lateral recess narrowing even though its fairly mild.  No high-grade stenosis.  Since we have seen her she has had no falls or trauma.  No other new findings.  No paresthesias or weakness.  No bowel or bladder issues.  She has chronic irritable bowel and colitis but not anything related to the spine.  Her case is complicated by hypothyroidism and bipolar disorder.  She has had physical therapy in the past and continues to try to stay active.  We had also sent her to Dr. Shon Baton to evaluate her knees.  She has been doing exercises for the knees and she can follow-up with him as needed for that.  He determined she did not have any high-grade arthritis of the knees.  Rates her pain and function scale is only a 3 out of 10 and is being uncomfortable because she can still do activities of daily living.  Average pain however can vary depending on her activity level.  She gets worsening with standing and walking for long periods of time.   Clinically reasonable sounding claudication type symptoms in the buttock.     Review of Systems  Musculoskeletal:  Positive for back pain and joint pain.  All other systems reviewed and are negative.  Otherwise per HPI.  Assessment & Plan: Visit Diagnoses:    ICD-10-CM   1. Lumbar radiculopathy  M54.16 XR C-ARM NO REPORT    Epidural Steroid injection    methylPREDNISolone acetate (DEPO-MEDROL) injection 80 mg    2. Spondylosis without myelopathy or radiculopathy, lumbar region  M47.816     3. Chronic right-sided low back pain without sciatica  M54.50    G89.29     4. Pain in right hip  M25.551        Plan: Findings:  1.  Chronic low back pain apparently right sided some referral into the hip and upper buttock region but not in the groin or down the leg.  Nonetheless did not respond to radiofrequency ablation of the facet joints but has responded to epidural injections in the past.  Has had all manner of conservative care and medication management without relief.  Last injection did seem to help and she feels like it is pretty similar at this point.  Will repeat the epidural injection today.  Consider updating lumbar spine imaging at some point.  Consider regrouping with physical therapy if needed.  2.  Bilateral knee  pain under better control has seen Dr. Shon Baton with ongoing exercises and follow-up with him as needed.    Meds & Orders:  Meds ordered this encounter  Medications   methylPREDNISolone acetate (DEPO-MEDROL) injection 80 mg    Orders Placed This Encounter  Procedures   XR C-ARM NO REPORT   Epidural Steroid injection    Follow-up: Return if symptoms worsen or fail to improve.   Procedures: No procedures performed  Lumbar Epidural Steroid Injection - Interlaminar Approach with Fluoroscopic Guidance  Patient: Jessica Berger      Date of Birth: 08/05/1955 MRN: [id number] PCP: Bary Leriche, PA-C      Visit Date: 10/24/2022   Universal Protocol:      Consent Given By: the patient  Position: PRONE  Additional Comments: Vital signs were monitored before and after the procedure. Patient was prepped and draped in the usual sterile fashion. The correct patient, procedure, and site was verified.   Injection Procedure Details:   Procedure diagnoses: Lumbar radiculopathy [M54.16]   Meds Administered:  Meds ordered this encounter  Medications   methylPREDNISolone acetate (DEPO-MEDROL) injection 80 mg     Laterality: Right  Location/Site:  L5-S1  Needle: 4.5 in., 20 ga. Tuohy  Needle Placement: Paramedian epidural  Findings:   -Comments: Excellent flow of contrast into the epidural space.  Procedure Details: Using a paramedian approach from the side mentioned above, the region overlying the inferior lamina was localized under fluoroscopic visualization and the soft tissues overlying this structure were infiltrated with 4 ml. of 1% Lidocaine without Epinephrine. The Tuohy needle was inserted into the epidural space using a paramedian approach.   The epidural space was localized using loss of resistance along with counter oblique bi-planar fluoroscopic views.  After negative aspirate for air, blood, and CSF, a 2 ml. volume of Isovue-250 was injected into the epidural space and the flow of contrast was observed. Radiographs were obtained for documentation purposes.    The injectate was administered into the level noted above.   Additional Comments:  No complications occurred Dressing: 2 x 2 sterile gauze and Band-Aid    Post-procedure details: Patient was observed during the procedure. Post-procedure instructions were reviewed.  Patient left the clinic in stable condition.   Clinical History: MRI LUMBAR SPINE WITHOUT CONTRAST   TECHNIQUE:  Multiplanar, multisequence MR imaging of the lumbar spine was  performed. No intravenous contrast was administered.   COMPARISON: Plain films lumbar spine 02/13/2019.    FINDINGS:  Segmentation: Standard.   Alignment: Maintained.   Vertebrae: No fracture, evidence of discitis, or bone lesion.   Conus medullaris and cauda equina: Conus extends to the L1 level.  Conus and cauda equina appear normal.   Paraspinal and other soft tissues: Negative.   Disc levels:   The T11-12 and T12-L1 are imaged in the sagittal plane only. Minimal  disc bulging is present at these levels without stenosis.   L1-2: Minimal disc bulge without stenosis.   L2-3: Negative.   L3-4: There is loss of disc space height with a very shallow bulge.  No stenosis.   L4-5: Negative.   L5-S1: Moderate facet arthropathy is worse on the right. No disc  bulge or protrusion. No stenosis.   IMPRESSION:  Mild lumbar degenerative disease without central canal or foraminal  stenosis. Moderate bilateral facet arthropathy at L5-S1 is worse on  the right.    Electronically Signed   By: Drusilla Kanner M.D.   On: 02/23/2019 17:31   She reports  Jessica Berger - 68 y.o. female MRN [id number]  Date of birth: 1955-02-27  Office Visit Note: Visit Date: 10/24/2022 PCP: Bary Leriche, PA-C Referred by: Allwardt, Crist Infante, PA-C  Subjective: Chief Complaint  Patient presents with   Lower Back - Pain   HPI: Jessica Berger is a 67 y.o. female who comes in today for evaluation and management for chronic and worsening now and severe once again low back and right low back and right hip pain.  The last time we saw her was in March and she did well with epidural injection.  Her brief history is that she has had more findings of facet arthropathy and spondylosis of the lumbar spine but did not really respond to the radiofrequency ablation procedure that we performed.  She did well with diagnostic facet blocks but not with the ablation.  She initially was a patient of Dr. Prince Rome when he was here.  We have since completed epidural injection that seems to help for this right sided low back and hip pain which likely could be from lateral recess narrowing even though its fairly mild.  No high-grade stenosis.  Since we have seen her she has had no falls or trauma.  No other new findings.  No paresthesias or weakness.  No bowel or bladder issues.  She has chronic irritable bowel and colitis but not anything related to the spine.  Her case is complicated by hypothyroidism and bipolar disorder.  She has had physical therapy in the past and continues to try to stay active.  We had also sent her to Dr. Shon Baton to evaluate her knees.  She has been doing exercises for the knees and she can follow-up with him as needed for that.  He determined she did not have any high-grade arthritis of the knees.  Rates her pain and function scale is only a 3 out of 10 and is being uncomfortable because she can still do activities of daily living.  Average pain however can vary depending on her activity level.  She gets worsening with standing and walking for long periods of time.   Clinically reasonable sounding claudication type symptoms in the buttock.     Review of Systems  Musculoskeletal:  Positive for back pain and joint pain.  All other systems reviewed and are negative.  Otherwise per HPI.  Assessment & Plan: Visit Diagnoses:    ICD-10-CM   1. Lumbar radiculopathy  M54.16 XR C-ARM NO REPORT    Epidural Steroid injection    methylPREDNISolone acetate (DEPO-MEDROL) injection 80 mg    2. Spondylosis without myelopathy or radiculopathy, lumbar region  M47.816     3. Chronic right-sided low back pain without sciatica  M54.50    G89.29     4. Pain in right hip  M25.551        Plan: Findings:  1.  Chronic low back pain apparently right sided some referral into the hip and upper buttock region but not in the groin or down the leg.  Nonetheless did not respond to radiofrequency ablation of the facet joints but has responded to epidural injections in the past.  Has had all manner of conservative care and medication management without relief.  Last injection did seem to help and she feels like it is pretty similar at this point.  Will repeat the epidural injection today.  Consider updating lumbar spine imaging at some point.  Consider regrouping with physical therapy if needed.  2.  Bilateral knee

## 2022-11-01 DIAGNOSIS — D225 Melanocytic nevi of trunk: Secondary | ICD-10-CM | POA: Diagnosis not present

## 2022-11-01 DIAGNOSIS — D2261 Melanocytic nevi of right upper limb, including shoulder: Secondary | ICD-10-CM | POA: Diagnosis not present

## 2022-11-01 DIAGNOSIS — D2262 Melanocytic nevi of left upper limb, including shoulder: Secondary | ICD-10-CM | POA: Diagnosis not present

## 2022-11-01 DIAGNOSIS — D692 Other nonthrombocytopenic purpura: Secondary | ICD-10-CM | POA: Diagnosis not present

## 2022-11-08 DIAGNOSIS — E669 Obesity, unspecified: Secondary | ICD-10-CM | POA: Diagnosis not present

## 2022-11-08 DIAGNOSIS — K219 Gastro-esophageal reflux disease without esophagitis: Secondary | ICD-10-CM | POA: Diagnosis not present

## 2022-11-08 DIAGNOSIS — Z800 Family history of malignant neoplasm of digestive organs: Secondary | ICD-10-CM | POA: Diagnosis not present

## 2022-11-08 DIAGNOSIS — K760 Fatty (change of) liver, not elsewhere classified: Secondary | ICD-10-CM | POA: Diagnosis not present

## 2022-11-09 DIAGNOSIS — G4733 Obstructive sleep apnea (adult) (pediatric): Secondary | ICD-10-CM | POA: Diagnosis not present

## 2022-11-10 ENCOUNTER — Other Ambulatory Visit: Payer: Self-pay | Admitting: Physical Medicine and Rehabilitation

## 2022-11-18 DIAGNOSIS — G4733 Obstructive sleep apnea (adult) (pediatric): Secondary | ICD-10-CM | POA: Diagnosis not present

## 2022-11-29 ENCOUNTER — Telehealth: Payer: Self-pay

## 2022-11-29 DIAGNOSIS — N390 Urinary tract infection, site not specified: Secondary | ICD-10-CM | POA: Diagnosis not present

## 2022-11-29 NOTE — Telephone Encounter (Signed)
Message sent to appointment desk to schedule UA lab visit

## 2022-11-29 NOTE — Telephone Encounter (Signed)
Patient left message on triage vm stating she feels like she needs to urinate really bad but when she goes its only a dribble. Patient states that symptoms started today. Please advise.

## 2022-11-30 ENCOUNTER — Encounter: Payer: Self-pay | Admitting: Obstetrics and Gynecology

## 2022-12-01 ENCOUNTER — Encounter: Payer: Self-pay | Admitting: Physician Assistant

## 2022-12-02 NOTE — Telephone Encounter (Signed)
Please see patient message and advise.

## 2022-12-10 DIAGNOSIS — G4733 Obstructive sleep apnea (adult) (pediatric): Secondary | ICD-10-CM | POA: Diagnosis not present

## 2022-12-19 DIAGNOSIS — G4733 Obstructive sleep apnea (adult) (pediatric): Secondary | ICD-10-CM | POA: Diagnosis not present

## 2022-12-27 ENCOUNTER — Encounter: Payer: BC Managed Care – PPO | Admitting: Physician Assistant

## 2023-01-05 ENCOUNTER — Encounter: Payer: Self-pay | Admitting: Physical Medicine and Rehabilitation

## 2023-01-09 ENCOUNTER — Other Ambulatory Visit: Payer: Self-pay | Admitting: Physical Medicine and Rehabilitation

## 2023-01-09 ENCOUNTER — Other Ambulatory Visit: Payer: Self-pay | Admitting: Physician Assistant

## 2023-01-09 DIAGNOSIS — E039 Hypothyroidism, unspecified: Secondary | ICD-10-CM

## 2023-01-09 DIAGNOSIS — M5416 Radiculopathy, lumbar region: Secondary | ICD-10-CM

## 2023-01-10 ENCOUNTER — Other Ambulatory Visit: Payer: Self-pay | Admitting: Physical Medicine and Rehabilitation

## 2023-01-10 MED ORDER — TRIAZOLAM 0.25 MG PO TABS: ORAL_TABLET | ORAL | 0 refills | Status: DC

## 2023-01-15 DIAGNOSIS — G4733 Obstructive sleep apnea (adult) (pediatric): Secondary | ICD-10-CM | POA: Diagnosis not present

## 2023-01-16 ENCOUNTER — Ambulatory Visit: Payer: BC Managed Care – PPO | Admitting: Obstetrics and Gynecology

## 2023-01-16 ENCOUNTER — Encounter: Payer: Self-pay | Admitting: Obstetrics and Gynecology

## 2023-01-16 VITALS — BP 112/78 | HR 76

## 2023-01-16 DIAGNOSIS — L0292 Furuncle, unspecified: Secondary | ICD-10-CM | POA: Diagnosis not present

## 2023-01-16 MED ORDER — CEPHALEXIN 500 MG PO CAPS: 500.0000 mg | ORAL_CAPSULE | Freq: Three times a day (TID) | ORAL | 0 refills | Status: AC

## 2023-01-16 MED ORDER — FLUCONAZOLE 150 MG PO TABS: 150.0000 mg | ORAL_TABLET | Freq: Once | ORAL | 0 refills | Status: AC

## 2023-01-16 NOTE — Progress Notes (Signed)
Patient presents with painful bump. Has been placing neosporin on it with no resolution. Blood pressure 112/78, pulse 76, last menstrual period 01/31/2002, SpO2 98%.  Past Medical History:  Diagnosis Date   Anemia, unspecified 04/18/2012   Anxiety    Bell's palsy    Bipolar 2 disorder (HCC)    Depression    Hearing loss on left    History of chickenpox    Hypercholesteremia    Hypothyroidism 02/01/2012   Ischemic colitis (HCC) 2010   Menopause    Migraine    OSA on CPAP    Ulcerative colitis (HCC) 02/01/2012   Past Surgical History:  Procedure Laterality Date   CATARACT EXTRACTION W/ INTRAOCULAR LENS IMPLANT Left 03/14/2016   CATARACT EXTRACTION W/ INTRAOCULAR LENS IMPLANT Right 04/18/2016   NASAL SEPTUM SURGERY     Social History   Socioeconomic History   Marital status: Married    Spouse name: Not on file   Number of children: 1   Years of education: BA   Highest education level: Bachelor's degree (e.g., BA, AB, BS)  Occupational History   Occupation: N/A  Tobacco Use   Smoking status: Former    Current packs/day: 0.00    Types: Cigarettes    Quit date: 04/25/1991    Years since quitting: 31.7   Smokeless tobacco: Never  Vaping Use   Vaping status: Never Used  Substance and Sexual Activity   Alcohol use: Yes    Alcohol/week: 6.0 standard drinks of alcohol    Types: 6 Standard drinks or equivalent per week    Comment: alcohol about 6 drinks a week   Drug use: No   Sexual activity: Yes    Partners: Male    Birth control/protection: Post-menopausal, Other-see comments    Comment: husband vasectomy  Other Topics Concern   Not on file  Social History Narrative   2 cups of coffee a day, occasional tea    Social Drivers of Corporate investment banker Strain: Low Risk  (10/08/2022)   Overall Financial Resource Strain (CARDIA)    Difficulty of Paying Living Expenses: Not hard at all  Food Insecurity: No Food Insecurity (10/08/2022)   Hunger Vital Sign    Worried  About Running Out of Food in the Last Year: Never true    Ran Out of Food in the Last Year: Never true  Transportation Needs: No Transportation Needs (10/08/2022)   PRAPARE - Administrator, Civil Service (Medical): No    Lack of Transportation (Non-Medical): No  Physical Activity: Insufficiently Active (10/08/2022)   Exercise Vital Sign    Days of Exercise per Week: 3 days    Minutes of Exercise per Session: 40 min  Stress: No Stress Concern Present (10/08/2022)   [employer] of Occupational Health - Occupational Stress Questionnaire    Feeling of Stress : Only a little  Social Connections: Moderately Isolated (10/08/2022)   Social Connection and Isolation Panel [NHANES]    Frequency of Communication with Friends and Family: Three times a week    Frequency of Social Gatherings with Friends and Family: Once a week    Attends Religious Services: Never    Database administrator or Organizations: No    Attends Engineer, structural: Not on file    Marital Status: Married  Catering manager Violence: Not on file   Allergies  Allergen Reactions   Demerol [Meperidine]     migraines   Penicillins Hives   Sulfa Antibiotics Hives  Exam Left inner groin with small boil with central point.  Erythema noted 2cm surrounding boil Patient consented for I&D  Procedure: I&D Patient consented for the procedure with written consent.  The area was cleaned with betadine.  1 cc of lidocaine with epi was injected subcutaneously.  The boil expressed without making an incision and pus was drained. Culture collected.  Good hemostasis was noted.  Patient tolerated the procedure well.  Post care instructions were discussed with patient.  To apply warm compressions twice daily and continue with neosporin. Keflex sent. Small risk for cross reactivity with penicillin allergy, but patient advised. RTC with any persistent or worsening s/s.  Dr. Karma Greaser

## 2023-01-18 DIAGNOSIS — G4733 Obstructive sleep apnea (adult) (pediatric): Secondary | ICD-10-CM | POA: Diagnosis not present

## 2023-01-20 LAB — WOUND CULTURE
MICRO NUMBER:: 15854954
RESULT:: NO GROWTH
SPECIMEN QUALITY:: ADEQUATE

## 2023-01-26 ENCOUNTER — Encounter: Payer: Self-pay | Admitting: Physical Medicine and Rehabilitation

## 2023-01-30 ENCOUNTER — Other Ambulatory Visit: Payer: BC Managed Care – PPO

## 2023-01-31 ENCOUNTER — Other Ambulatory Visit: Payer: BC Managed Care – PPO

## 2023-02-02 ENCOUNTER — Ambulatory Visit
Admission: RE | Admit: 2023-02-02 | Discharge: 2023-02-02 | Disposition: A | Discharge status: Home / Self Care | Payer: BC Managed Care – PPO | Source: Ambulatory Visit | Attending: Physical Medicine and Rehabilitation | Admitting: Physical Medicine and Rehabilitation

## 2023-02-02 DIAGNOSIS — M545 Low back pain, unspecified: Secondary | ICD-10-CM | POA: Diagnosis not present

## 2023-02-02 DIAGNOSIS — M5416 Radiculopathy, lumbar region: Secondary | ICD-10-CM

## 2023-02-10 DIAGNOSIS — F3181 Bipolar II disorder: Secondary | ICD-10-CM | POA: Diagnosis not present

## 2023-02-18 DIAGNOSIS — G4733 Obstructive sleep apnea (adult) (pediatric): Secondary | ICD-10-CM | POA: Diagnosis not present

## 2023-02-20 ENCOUNTER — Ambulatory Visit: Payer: BC Managed Care – PPO | Admitting: Physical Medicine and Rehabilitation

## 2023-02-20 ENCOUNTER — Encounter: Payer: Self-pay | Admitting: Physical Medicine and Rehabilitation

## 2023-02-20 DIAGNOSIS — M47819 Spondylosis without myelopathy or radiculopathy, site unspecified: Secondary | ICD-10-CM | POA: Diagnosis not present

## 2023-02-20 DIAGNOSIS — G8929 Other chronic pain: Secondary | ICD-10-CM | POA: Diagnosis not present

## 2023-02-20 DIAGNOSIS — M47816 Spondylosis without myelopathy or radiculopathy, lumbar region: Secondary | ICD-10-CM

## 2023-02-20 DIAGNOSIS — M545 Low back pain, unspecified: Secondary | ICD-10-CM | POA: Diagnosis not present

## 2023-02-20 NOTE — Progress Notes (Signed)
Jessica Berger - 68 y.o. female MRN [id number]  Date of birth: 04/19/1955  Office Visit Note: Visit Date: 02/20/2023 PCP: Bary Leriche, PA-C Referred by: Allwardt, Crist Infante, PA-C  Subjective: Chief Complaint  Patient presents with   Lower Back - Pain   HPI: Jessica Berger is a 68 y.o. female who comes in today for evaluation of chronic, worsening and severe right sided lower back pain. She is well known to [place], previous patient of Dr. Casimiro Needle Hilts. Pain ongoing for several years. Her pain worsens with standing, bending and activity. She reports severe pain with cooking and household tasks.  She describes pain as sharp and aching sensation, currently rates as 5 out of 10. Some relief of pain with home exercise regimen, rest and use of medications. She is currently undergoing physical therapy treatments with Dr. Lorenda Peck that she reports is beneficial in alleviating her pain short term. She is also undergoing massage therapy monthly. She has tried Meloxicam in the past with minimal relief of pain. Recent lumbar MRI imaging shows mild to moderate facet arthropathy at L4-L5 and moderate facet arthropathy at L5-S1. No significant nerve impingement noted. No high grade spinal canal stenosis. Patient has undergone multiple lumbar injections in our office including lumbar epidural steroid injections, medial branch blocks and radiofrequency ablation. Good relief of pain with lumbar epidural steroid injections until recently, no relief of pain with right L4-L5 interlaminar epidural steroid injection on 10/24/2022. She did well with medial branch blocks, however no relief of pain with radiofrequency ablation. Patient reports pain is negatively impacting ability to function. She denies focal weakness, numbness and tingling. No recent trauma or falls.      Review of Systems  Musculoskeletal:  Positive for back pain.  Neurological:  Negative for tingling, sensory change, focal weakness and  weakness.  All other systems reviewed and are negative.  Otherwise per HPI.  Assessment & Plan: Visit Diagnoses: No diagnosis found.   Plan: Findings:  Chronic, worsening and severe right sided lower back pain.  No radicular symptoms down the legs.  Patient continues to have severe pain despite good conservative therapy such as formal physical therapy, massage therapy, home exercise regimen, rest and use of medications.  I discussed recent lumbar MRI today with patient using imaging and spine model.  Patient's clinical presentation and exam are consistent with facet mediated pain.  There is facet arthropathy noted at the levels of L4-L5 and L5-S1 on recent lumbar MRI imaging.  She does have severe pain with standing and performing household tasks.  Pain noted with lumbar extension on exam today.  We discussed treatment plan in detail today.  Next step is to perform diagnostic and hopefully therapeutic right L4-L5 and L5-S1 facet joint injections under fluoroscopic guidance.  I discussed injection procedure with patient in detail today, she has no questions at this time.  I encouraged patient to continue physical therapy treatments with Dr. Ralph Dowdy, she can also continue massage therapy.  We will see her back for lumbar facet joint injections.  No red flag symptoms noted upon exam today.    Meds & Orders: No orders of the defined types were placed in this encounter.  No orders of the defined types were placed in this encounter.   Follow-up: No follow-ups on file.   Procedures: No procedures performed      Clinical History: CLINICAL DATA:  Low back pain, symptoms persist with > 6 wks treatment   EXAM: MRI LUMBAR SPINE WITHOUT  CONTRAST   TECHNIQUE: Multiplanar, multisequence MR imaging of the lumbar spine was performed. No intravenous contrast was administered.   COMPARISON:  MRI of the lumbar spine 02/23/2019 is not retrievable at the time of dictation. Lumbar radiographs 02/13/2019  are available.   FINDINGS: Segmentation: Standard segmentation is assumed. The inferior-most fully formed intervertebral disc is labeled L5-S1.   Alignment: Slight grade 1 retrolisthesis of L3 on L4. Otherwise, no substantial sagittal subluxation.   Vertebrae: Vertebral body heights are maintained. No evidence of acute fracture or discitis/osteomyelitis. No suspicious bone lesions   Conus medullaris and cauda equina: Conus extends to the L1 level. Conus and cauda equina appear normal.   Paraspinal and other soft tissues: Unremarkable.   Disc levels:   T12-L1: Mild disc bulge.  No significant stenosis.   L1-L2: Mild disc bulge. Mild facet arthropathy. No significant stenosis.   L2-L3: Mild disc bulging with small foraminal disc protrusions. Mild facet arthropathy. No significant stenosis.   L3-L4: Mild disc bulging with small foraminal disc protrusions. Mild facet arthropathy. No significant stenosis.   L4-L5: Mild to moderate facet arthropathy.  No significant stenosis.   L5-S1: Moderate facet arthropathy.  No significant stenosis.   IMPRESSION: 1. No significant canal or foraminal stenosis. 2. Multilevel facet arthropathy, greatest and moderate at L5-S1.     Electronically Signed   By: Feliberto Harts M.D.   On: 02/13/2023 06:05   She reports that she quit smoking about 31 years ago. Her smoking use included cigarettes. She has never used smokeless tobacco. No results for input(s): "HGBA1C", "LABURIC" in the last 8760 hours.  Objective:  VS:  HT:    WT:   BMI:     BP:   HR: bpm  TEMP: ( )  RESP:  Physical Exam Vitals and nursing note reviewed.  HENT:     Head: Normocephalic and atraumatic.     Right Ear: External ear normal.     Left Ear: External ear normal.     Nose: Nose normal.     Mouth/Throat:     Mouth: Mucous membranes are moist.  Eyes:     Extraocular Movements: Extraocular movements intact.  Cardiovascular:     Rate and Rhythm: Normal  rate.     Pulses: Normal pulses.  Pulmonary:     Effort: Pulmonary effort is normal.  Abdominal:     General: Abdomen is flat. There is no distension.  Musculoskeletal:        General: Tenderness present.     Cervical back: Normal range of motion.     Comments: Patient rises from seated position to standing without difficulty. Pain noted with facet loading and lumbar extension. 5/5 strength noted with bilateral hip flexion, knee flexion/extension, ankle dorsiflexion/plantarflexion and EHL. No clonus noted bilaterally. No pain upon palpation of greater trochanters. No pain with internal/external rotation of bilateral hips. Sensation intact bilaterally. Negative slump test bilaterally. Ambulates without aid, gait steady.     Skin:    General: Skin is warm and dry.     Capillary Refill: Capillary refill takes less than 2 seconds.  Neurological:     General: No focal deficit present.     Mental Status: She is alert and oriented to person, place, and time.  Psychiatric:        Mood and Affect: Mood normal.        Behavior: Behavior normal.     Ortho Exam  Imaging: No results found.  Past Medical/Family/Surgical/Social History: Medications & Allergies reviewed  per EMR, new medications updated. Patient Active Problem List   Diagnosis Date Noted   Mixed hyperlipidemia 10/29/2021   Vitamin D deficiency 10/29/2021   Bloating 09/28/2020   Gastroesophageal reflux disease 09/28/2020   Change in bowel habit 09/28/2020   Encounter for annual physical exam 09/28/2020   Diaphragmatic hernia 09/28/2020   Family history of malignant neoplasm of digestive organs 09/28/2020   Fatty liver 09/28/2020   Hiatal hernia 09/28/2020   Irritable bowel syndrome with diarrhea 09/28/2020   Morbid obesity (HCC) 09/28/2020   Periumbilical pain 09/28/2020   History of colonic polyps 09/28/2020   Seasonal and perennial allergic rhinitis 05/18/2017   Easy bruising 07/18/2016   Hypothyroidism 07/15/2016    Bipolar 2 disorder (HCC)    Ischemic colitis (HCC)    Ulcerative colitis (HCC) 02/01/2012   Past Medical History:  Diagnosis Date   Anemia, unspecified 04/18/2012   Anxiety    Bell's palsy    Bipolar 2 disorder (HCC)    Depression    Hearing loss on left    History of chickenpox    Hypercholesteremia    Hypothyroidism 02/01/2012   Ischemic colitis (HCC) 2010   Menopause    Migraine    OSA on CPAP    Ulcerative colitis (HCC) 02/01/2012   Family History  Problem Relation Age of Onset   Heart failure Mother    Osteoarthritis Mother    Depression Mother    Hypertension Mother    Stroke Mother    Asthma Mother    Colon cancer Father 50       Colon   Hypertension Father    Breast cancer Maternal Grandmother    Cancer Maternal Grandfather        lung   Cancer Paternal Grandmother        skin cancer, nose   Heart attack Paternal Grandfather    Urticaria Neg Hx    Past Surgical History:  Procedure Laterality Date   CATARACT EXTRACTION W/ INTRAOCULAR LENS IMPLANT Left 03/14/2016   CATARACT EXTRACTION W/ INTRAOCULAR LENS IMPLANT Right 04/18/2016   NASAL SEPTUM SURGERY     Social History   Occupational History   Occupation: N/A  Tobacco Use   Smoking status: Former    Current packs/day: 0.00    Types: Cigarettes    Quit date: 04/25/1991    Years since quitting: 31.8   Smokeless tobacco: Never  Vaping Use   Vaping status: Never Used  Substance and Sexual Activity   Alcohol use: Yes    Alcohol/week: 6.0 standard drinks of alcohol    Types: 6 Standard drinks or equivalent per week    Comment: alcohol about 6 drinks a week   Drug use: No   Sexual activity: Yes    Partners: Male    Birth control/protection: Post-menopausal, Other-see comments    Comment: husband vasectomy

## 2023-03-13 ENCOUNTER — Ambulatory Visit: Payer: BC Managed Care – PPO | Admitting: Physical Medicine and Rehabilitation

## 2023-03-13 ENCOUNTER — Other Ambulatory Visit: Payer: Self-pay

## 2023-03-13 VITALS — BP 160/95 | HR 58

## 2023-03-13 DIAGNOSIS — M47816 Spondylosis without myelopathy or radiculopathy, lumbar region: Secondary | ICD-10-CM | POA: Diagnosis not present

## 2023-03-13 MED ORDER — METHYLPREDNISOLONE ACETATE 40 MG/ML IJ SUSP
40.0000 mg | Freq: Once | INTRAMUSCULAR | Status: AC
  Administered 2023-03-13: 40 mg

## 2023-03-13 NOTE — Progress Notes (Signed)
 Pain scale---5 No Allergies to contrast Dye No Blood thinners

## 2023-03-13 NOTE — Patient Instructions (Signed)

## 2023-03-15 ENCOUNTER — Encounter: Payer: Self-pay | Admitting: Physician Assistant

## 2023-03-15 DIAGNOSIS — E669 Obesity, unspecified: Secondary | ICD-10-CM | POA: Insufficient documentation

## 2023-03-16 ENCOUNTER — Ambulatory Visit (INDEPENDENT_AMBULATORY_CARE_PROVIDER_SITE_OTHER): Payer: BC Managed Care – PPO | Admitting: Physician Assistant

## 2023-03-16 ENCOUNTER — Encounter: Payer: Self-pay | Admitting: Physician Assistant

## 2023-03-16 VITALS — BP 172/102 | HR 57 | Temp 97.7°F | Ht 65.75 in | Wt 222.4 lb

## 2023-03-16 DIAGNOSIS — R5383 Other fatigue: Secondary | ICD-10-CM

## 2023-03-16 DIAGNOSIS — Z136 Encounter for screening for cardiovascular disorders: Secondary | ICD-10-CM | POA: Diagnosis not present

## 2023-03-16 DIAGNOSIS — E039 Hypothyroidism, unspecified: Secondary | ICD-10-CM

## 2023-03-16 DIAGNOSIS — R030 Elevated blood-pressure reading, without diagnosis of hypertension: Secondary | ICD-10-CM

## 2023-03-16 DIAGNOSIS — E559 Vitamin D deficiency, unspecified: Secondary | ICD-10-CM

## 2023-03-16 DIAGNOSIS — Z131 Encounter for screening for diabetes mellitus: Secondary | ICD-10-CM | POA: Diagnosis not present

## 2023-03-16 DIAGNOSIS — Z0000 Encounter for general adult medical examination without abnormal findings: Secondary | ICD-10-CM

## 2023-03-16 LAB — VITAMIN D 25 HYDROXY (VIT D DEFICIENCY, FRACTURES): VITD: 82.96 ng/mL (ref 30.00–100.00)

## 2023-03-16 LAB — COMPREHENSIVE METABOLIC PANEL
ALT: 15 U/L (ref 0–35)
AST: 19 U/L (ref 0–37)
Albumin: 4.4 g/dL (ref 3.5–5.2)
Alkaline Phosphatase: 62 U/L (ref 39–117)
BUN: 13 mg/dL (ref 6–23)
CO2: 27 meq/L (ref 19–32)
Calcium: 9.3 mg/dL (ref 8.4–10.5)
Chloride: 97 meq/L (ref 96–112)
Creatinine, Ser: 0.77 mg/dL (ref 0.40–1.20)
GFR: 80.2 mL/min (ref >60.00)
Glucose, Bld: 100 mg/dL — ABNORMAL HIGH (ref 70–99)
Potassium: 4 meq/L (ref 3.5–5.1)
Sodium: 133 meq/L — ABNORMAL LOW (ref 135–145)
Total Bilirubin: 0.6 mg/dL (ref 0.2–1.2)
Total Protein: 7.5 g/dL (ref 6.0–8.3)

## 2023-03-16 LAB — CBC WITH DIFFERENTIAL/PLATELET
Basophils Absolute: 0 10*3/uL (ref 0.0–0.1)
Basophils Relative: 0.7 % (ref 0.0–3.0)
Eosinophils Absolute: 0.1 10*3/uL (ref 0.0–0.7)
Eosinophils Relative: 1.3 % (ref 0.0–5.0)
HCT: 44.7 % (ref 36.0–46.0)
Hemoglobin: 15.2 g/dL — ABNORMAL HIGH (ref 12.0–15.0)
Lymphocytes Relative: 22.8 % (ref 12.0–46.0)
Lymphs Abs: 1.5 10*3/uL (ref 0.7–4.0)
MCHC: 34 g/dL (ref 30.0–36.0)
MCV: 97.7 fL (ref 78.0–100.0)
Monocytes Absolute: 0.7 10*3/uL (ref 0.1–1.0)
Monocytes Relative: 10.3 % (ref 3.0–12.0)
Neutro Abs: 4.3 10*3/uL (ref 1.4–7.7)
Neutrophils Relative %: 64.9 % (ref 43.0–77.0)
Platelets: 267 10*3/uL (ref 150.0–400.0)
RBC: 4.58 Mil/uL (ref 3.87–5.11)
RDW: 13.5 % (ref 11.5–15.5)
WBC: 6.6 10*3/uL (ref 4.0–10.5)

## 2023-03-16 LAB — LIPID PANEL
Cholesterol: 268 mg/dL — ABNORMAL HIGH (ref 0–200)
HDL: 99.3 mg/dL (ref >39.00)
LDL Cholesterol: 152 mg/dL — ABNORMAL HIGH (ref 0–99)
NonHDL: 168.35
Total CHOL/HDL Ratio: 3
Triglycerides: 84 mg/dL (ref 0.0–149.0)
VLDL: 16.8 mg/dL (ref 0.0–40.0)

## 2023-03-16 LAB — TSH: TSH: 1.75 u[IU]/mL (ref 0.35–5.50)

## 2023-03-16 LAB — VITAMIN B12: Vitamin B-12: 859 pg/mL (ref 211–911)

## 2023-03-16 LAB — HEMOGLOBIN A1C: Hgb A1c MFr Bld: 5.3 % (ref 4.6–6.5)

## 2023-03-16 NOTE — Progress Notes (Signed)
Patient ID: Jessica Berger, female    DOB: 07/09/1955, 68 y.o.   MRN: [id number]   Assessment & Plan:  Annual physical exam -     CBC with Differential/Platelet -     Comprehensive metabolic panel -     Hemoglobin A1c -     Lipid panel -     TSH -     Vitamin B12 -     VITAMIN D 25 Hydroxy (Vit-D Deficiency, Fractures)  Vitamin D deficiency -     VITAMIN D 25 Hydroxy (Vit-D Deficiency, Fractures)  Other fatigue -     Hemoglobin A1c -     TSH -     Vitamin B12 -     VITAMIN D 25 Hydroxy (Vit-D Deficiency, Fractures)  Hypothyroidism, unspecified type  Elevated blood pressure reading    Age-appropriate screening and counseling performed today. Will check labs and call with results. Preventive measures discussed and printed in AVS for patient.   Patient Counseling: [x]   Nutrition: Stressed importance of moderation in sodium/caffeine intake, saturated fat and cholesterol, caloric balance, sufficient intake of fresh fruits, vegetables, and fiber.  [x]   Stressed the importance of regular exercise.   [x]   Substance Abuse: Discussed cessation/primary prevention of tobacco, alcohol, or other drug use; driving or other dangerous activities under the influence; availability of treatment for abuse.   []   Injury prevention: Discussed safety belts, safety helmets, smoke detector, smoking near bedding or upholstery.   []   Sexuality: Discussed sexually transmitted diseases, partner selection, use of condoms, avoidance of unintended pregnancy  and contraceptive alternatives.   [x]   Dental health: Discussed importance of regular tooth brushing, flossing, and dental visits.  [x]   Health maintenance and immunizations reviewed. Please refer to Health maintenance section.     Assessment and Plan    Contact Dermatitis   Persistent itching and skin reaction due to contact with certain preservatives found in various products. Patient is aware and managing by avoiding known triggers.   -Continue  avoidance of known triggers.  Follow with dermatology.  Mild Hearing Loss   Patient has been offered hearing aids but has not yet decided to use them. No significant impact on daily life reported.   -Consider hearing aids if hearing loss becomes more problematic.    Arthritis in Spine   Recent steroid injections for pain management. Patient reports significant pain impacting daily activities and exercise.   -Monitor response to recent steroid injections.    Hypertension   Recent elevation in blood pressure, possibly due to recent steroid injections.   -Monitor blood pressure at home and report readings to provider.    General Health Maintenance   -Continue Vitamin D and B12 supplementation.   -Consider Shingles and Tetanus vaccines at pharmacy.   -Scheduled for mammogram and colon cancer screening in July.   -Continue regular follow-up with multiple specialists     Return in about 1 year (around 03/15/2024) for physical.    Subjective:    Chief Complaint  Patient presents with   Annual Exam    Pt here for Annual Exam and is currently fasting    Hypothyroidism    HPI Patient is in today for annual exam.  Specialists: Dermatology - Dr. Doreen Beam  GI Psychiatry - Dr. Theodosia Quay - Dr. Alvester Morin  GYN - Dr. Karma Greaser    History of Present Illness   Jessica Berger is a 68 year old female who presents for an annual physical exam.  No swelling  in the legs. Blood pressure was elevated today, but had steroid injections in back three days ago. She experiences white coat syndrome, with normal readings at home. No symptoms.  She has mild hearing loss in both ears, identified in September. She has not pursued hearing aids, as she feels her current lifestyle does not necessitate them.  She has arthritis in her spine and recently received steroid injections three days ago. She reports significant pain that limits her ability to perform daily activities, such as standing in the shower  and exercising. She has previously undergone an ablation and multiple epidural injections, which provided relief for about a year each time until the last one, which was ineffective.  She has a history of contact dermatitis, diagnosed after testing at [facility]. The condition is triggered by preservatives found in products like Woolite, shampoo, conditioner, paint, and nail polish. She experiences flare-ups when exposed to these substances, causing persistent itching, particularly when wearing clothes washed with the offending substances.  She takes vitamin D and B12 supplements, as well as calcium, and is on thyroid medication. Her vitamin D deficiency and B12-related symptoms have improved with supplementation.  Her father died of colon cancer, and she is diligent about regular screenings. She is scheduled for a mammogram and colon cancer screening in July.       Past Medical History:  Diagnosis Date   Allergy    Anemia, unspecified 04/18/2012   Anxiety    Arthritis    Bell's palsy    Bipolar 2 disorder (HCC)    Cataract    Depression    GERD (gastroesophageal reflux disease)    Hearing loss on left    History of chickenpox    Hypercholesteremia    Hypothyroidism 02/01/2012   Ischemic colitis (HCC) 2010   Menopause    Migraine    OSA on CPAP    Sleep apnea    Ulcerative colitis (HCC) 02/01/2012    Past Surgical History:  Procedure Laterality Date   CATARACT EXTRACTION W/ INTRAOCULAR LENS IMPLANT Left 03/14/2016   CATARACT EXTRACTION W/ INTRAOCULAR LENS IMPLANT Right 04/18/2016   EYE SURGERY     NASAL SEPTUM SURGERY      Family History  Problem Relation Age of Onset   Heart failure Mother    Osteoarthritis Mother    Depression Mother    Hypertension Mother    Stroke Mother    Asthma Mother    Arthritis Mother    Heart disease Mother    Vision loss Mother    Varicose Veins Mother    Colon cancer Father 17       Colon   Hypertension Father    Arthritis Father     Cancer Father    COPD Father    Breast cancer Maternal Grandmother    Cancer Maternal Grandmother    Cancer Maternal Grandfather        lung   Cancer Paternal Grandmother        skin cancer, nose   Heart attack Paternal Grandfather    Anxiety disorder Sister    Urticaria Neg Hx     Social History   Tobacco Use   Smoking status: Former    Current packs/day: 0.00    Average packs/day: 1 pack/day for 12.0 years (12.0 ttl pk-yrs)    Types: Cigarettes    Quit date: 04/25/1991    Years since quitting: 31.9   Smokeless tobacco: Never  Vaping Use   Vaping status: Never Used  Substance  Use Topics   Alcohol use: Yes    Alcohol/week: 6.0 standard drinks of alcohol    Types: 6 Standard drinks or equivalent per week    Comment: alcohol about 6 drinks a week   Drug use: No     Allergies  Allergen Reactions   Demerol [Meperidine]     migraines   Penicillins Hives   Sulfa Antibiotics Hives    Review of Systems NEGATIVE UNLESS OTHERWISE INDICATED IN HPI      Objective:     BP (!) 172/102   Pulse (!) 57   Temp 97.7 F (36.5 C)   Ht 5' 5.75" (1.67 m)   Wt 222 lb 6.4 oz (100.9 kg)   LMP 01/31/2002 (Approximate)   SpO2 95%   BMI 36.17 kg/m   Wt Readings from Last 3 Encounters:  03/16/23 222 lb 6.4 oz (100.9 kg)  10/13/22 215 lb (97.5 kg)  10/12/22 216 lb (98 kg)    BP Readings from Last 3 Encounters:  03/16/23 (!) 172/102  03/13/23 (!) 160/95  01/16/23 112/78     Physical Exam Vitals and nursing note reviewed.  Constitutional:      Appearance: Normal appearance. She is obese. She is not toxic-appearing.  HENT:     Head: Normocephalic and atraumatic.     Right Ear: Tympanic membrane, ear canal and external ear normal.     Left Ear: Tympanic membrane, ear canal and external ear normal.     Nose: Nose normal.     Mouth/Throat:     Mouth: Mucous membranes are moist.  Eyes:     Extraocular Movements: Extraocular movements intact.     Conjunctiva/sclera:  Conjunctivae normal.     Pupils: Pupils are equal, round, and reactive to light.  Cardiovascular:     Rate and Rhythm: Normal rate and regular rhythm.     Pulses: Normal pulses.     Heart sounds: Normal heart sounds.  Pulmonary:     Effort: Pulmonary effort is normal.     Breath sounds: Normal breath sounds.  Abdominal:     General: Abdomen is flat. Bowel sounds are normal.     Palpations: Abdomen is soft.  Musculoskeletal:        General: Normal range of motion.     Cervical back: Normal range of motion and neck supple.     Right lower leg: No edema.     Left lower leg: No edema.  Skin:    General: Skin is warm and dry.     Findings: Erythema (telangiectasias noted on face) present.  Neurological:     General: No focal deficit present.     Mental Status: She is alert and oriented to person, place, and time.  Psychiatric:        Mood and Affect: Mood normal.        Behavior: Behavior normal.        Thought Content: Thought content normal.        Judgment: Judgment normal.       Enio Hornback M Tensley Wery, PA-C

## 2023-03-21 DIAGNOSIS — G4733 Obstructive sleep apnea (adult) (pediatric): Secondary | ICD-10-CM | POA: Diagnosis not present

## 2023-03-22 ENCOUNTER — Encounter: Payer: Self-pay | Admitting: Physician Assistant

## 2023-03-25 NOTE — Procedures (Signed)
 Lumbar Diagnostic Facet Joint Nerve Block with Fluoroscopic Guidance   Patient: Jessica Berger      Date of Birth: 1955/07/09 MRN: [id number] PCP: Bary Leriche, PA-C      Visit Date: 03/13/2023   Universal Protocol:    Date/Time: 02/22/251:41 PM  Consent Given By: the patient  Position: PRONE  Additional Comments: Vital signs were monitored before and after the procedure. Patient was prepped and draped in the usual sterile fashion. The correct patient, procedure, and site was verified.   Injection Procedure Details:   Procedure diagnoses:  1. Spondylosis without myelopathy or radiculopathy, lumbar region      Meds Administered:  Meds ordered this encounter  Medications   methylPREDNISolone acetate (DEPO-MEDROL) injection 40 mg     Laterality: Right  Location/Site: L4-L5, L3 and L4 medial branches and L5-S1, L4 medial branch and L5 dorsal ramus  Needle: 5.0 in., 25 ga.  Short bevel or Quincke spinal needle  Needle Placement: Oblique pedical  Findings:   -Comments: There was excellent flow of contrast along the articular pillars without intravascular flow.  Procedure Details: The fluoroscope beam is vertically oriented in AP and then obliqued 15 to 20 degrees to the ipsilateral side of the desired nerve to achieve the "Scotty dog" appearance.  The skin over the target area of the junction of the superior articulating process and the transverse process (sacral ala if blocking the L5 dorsal rami) was locally anesthetized with a 1 ml volume of 1% Lidocaine without Epinephrine.  The spinal needle was inserted and advanced in a trajectory view down to the target.   After contact with periosteum and negative aspirate for blood and CSF, correct placement without intravascular or epidural spread was confirmed by injecting 0.5 ml. of Isovue-250.  A spot radiograph was obtained of this image.    Next, a 0.5 ml. volume of the injectate described above was injected. The  needle was then redirected to the other facet joint nerves mentioned above if needed.  Prior to the procedure, the patient was given a Pain Diary which was completed for baseline measurements.  After the procedure, the patient rated their pain every 30 minutes and will continue rating at this frequency for a total of 5 hours.  The patient has been asked to complete the Diary and return to [place] by mail, fax or hand delivered as soon as possible.   Additional Comments:  No complications occurred Dressing: 2 x 2 sterile gauze and Band-Aid    Post-procedure details: Patient was observed during the procedure. Post-procedure instructions were reviewed.  Patient left the clinic in stable condition.

## 2023-03-25 NOTE — Progress Notes (Signed)
 Jessica Berger - 68 y.o. female MRN [id number]  Date of birth: 1955-06-03  Office Visit Note: Visit Date: 03/13/2023 PCP: Bary Leriche, PA-C Referred by: Allwardt, Crist Infante, PA-C  Subjective: Chief Complaint  Patient presents with   Lower Back - Pain   HPI:  Jessica Berger is a 68 y.o. female who comes in today at the request of Ellin Goodie, FNP for planned Right  L4-5 and L5-S1 Lumbar facet/medial branch block with fluoroscopic guidance.  The patient has failed conservative care including home exercise, medications, time and activity modification.  This injection will be diagnostic and hopefully therapeutic.  Please see requesting physician notes for further details and justification.  Exam has shown concordant pain with facet joint loading.   ROS Otherwise per HPI.  Assessment & Plan: Visit Diagnoses:    ICD-10-CM   1. Spondylosis without myelopathy or radiculopathy, lumbar region  M47.816 XR C-ARM NO REPORT    Facet Injection    methylPREDNISolone acetate (DEPO-MEDROL) injection 40 mg      Plan: No additional findings.   Meds & Orders:  Meds ordered this encounter  Medications   methylPREDNISolone acetate (DEPO-MEDROL) injection 40 mg    Orders Placed This Encounter  Procedures   Facet Injection   XR C-ARM NO REPORT    Follow-up: Return if symptoms worsen or fail to improve.   Procedures: No procedures performed  Lumbar Diagnostic Facet Joint Nerve Block with Fluoroscopic Guidance   Patient: Jessica Berger      Date of Birth: [birth year] MRN: [id number] PCP: Bary Leriche, PA-C      Visit Date: 03/13/2023   Universal Protocol:    Date/Time: 02/22/251:41 PM  Consent Given By: the patient  Position: PRONE  Additional Comments: Vital signs were monitored before and after the procedure. Patient was prepped and draped in the usual sterile fashion. The correct patient, procedure, and site was verified.   Injection Procedure Details:    Procedure diagnoses:  1. Spondylosis without myelopathy or radiculopathy, lumbar region      Meds Administered:  Meds ordered this encounter  Medications   methylPREDNISolone acetate (DEPO-MEDROL) injection 40 mg     Laterality: Right  Location/Site: L4-L5, L3 and L4 medial branches and L5-S1, L4 medial branch and L5 dorsal ramus  Needle: 5.0 in., 25 ga.  Short bevel or Quincke spinal needle  Needle Placement: Oblique pedical  Findings:   -Comments: There was excellent flow of contrast along the articular pillars without intravascular flow.  Procedure Details: The fluoroscope beam is vertically oriented in AP and then obliqued 15 to 20 degrees to the ipsilateral side of the desired nerve to achieve the "Scotty dog" appearance.  The skin over the target area of the junction of the superior articulating process and the transverse process (sacral ala if blocking the L5 dorsal rami) was locally anesthetized with a 1 ml volume of 1% Lidocaine without Epinephrine.  The spinal needle was inserted and advanced in a trajectory view down to the target.   After contact with periosteum and negative aspirate for blood and CSF, correct placement without intravascular or epidural spread was confirmed by injecting 0.5 ml. of Isovue-250.  A spot radiograph was obtained of this image.    Next, a 0.5 ml. volume of the injectate described above was injected. The needle was then redirected to the other facet joint nerves mentioned above if needed.  Prior to the procedure, the patient was given a Pain Diary which was completed  for baseline measurements.  After the procedure, the patient rated their pain every 30 minutes and will continue rating at this frequency for a total of 5 hours.  The patient has been asked to complete the Diary and return to [place] by mail, fax or hand delivered as soon as possible.   Additional Comments:  No complications occurred Dressing: 2 x 2 sterile gauze and Band-Aid     Post-procedure details: Patient was observed during the procedure. Post-procedure instructions were reviewed.  Patient left the clinic in stable condition.   Clinical History: CLINICAL DATA:  Low back pain, symptoms persist with > 6 wks treatment   EXAM: MRI LUMBAR SPINE WITHOUT CONTRAST   TECHNIQUE: Multiplanar, multisequence MR imaging of the lumbar spine was performed. No intravenous contrast was administered.   COMPARISON:  MRI of the lumbar spine 02/23/2019 is not retrievable at the time of dictation. Lumbar radiographs 02/13/2019 are available.   FINDINGS: Segmentation: Standard segmentation is assumed. The inferior-most fully formed intervertebral disc is labeled L5-S1.   Alignment: Slight grade 1 retrolisthesis of L3 on L4. Otherwise, no substantial sagittal subluxation.   Vertebrae: Vertebral body heights are maintained. No evidence of acute fracture or discitis/osteomyelitis. No suspicious bone lesions   Conus medullaris and cauda equina: Conus extends to the L1 level. Conus and cauda equina appear normal.   Paraspinal and other soft tissues: Unremarkable.   Disc levels:   T12-L1: Mild disc bulge.  No significant stenosis.   L1-L2: Mild disc bulge. Mild facet arthropathy. No significant stenosis.   L2-L3: Mild disc bulging with small foraminal disc protrusions. Mild facet arthropathy. No significant stenosis.   L3-L4: Mild disc bulging with small foraminal disc protrusions. Mild facet arthropathy. No significant stenosis.   L4-L5: Mild to moderate facet arthropathy.  No significant stenosis.   L5-S1: Moderate facet arthropathy.  No significant stenosis.   IMPRESSION: 1. No significant canal or foraminal stenosis. 2. Multilevel facet arthropathy, greatest and moderate at L5-S1.     Electronically Signed   By: Feliberto Harts M.D.   On: 02/13/2023 06:05     Objective:  VS:  HT:    WT:   BMI:     BP:(!) 160/95  HR:(!) 58bpm  TEMP: ( )   RESP:  Physical Exam Vitals and nursing note reviewed.  Constitutional:      General: She is not in acute distress.    Appearance: Normal appearance. She is not ill-appearing.  HENT:     Head: Normocephalic and atraumatic.     Right Ear: External ear normal.     Left Ear: External ear normal.  Eyes:     Extraocular Movements: Extraocular movements intact.  Cardiovascular:     Rate and Rhythm: Normal rate.     Pulses: Normal pulses.  Pulmonary:     Effort: Pulmonary effort is normal. No respiratory distress.  Abdominal:     General: There is no distension.     Palpations: Abdomen is soft.  Musculoskeletal:        General: Tenderness present.     Cervical back: Neck supple.     Right lower leg: No edema.     Left lower leg: No edema.     Comments: Patient has good distal strength with no pain over the greater trochanters.  No clonus or focal weakness.  Skin:    Findings: No erythema, lesion or rash.  Neurological:     General: No focal deficit present.     Mental Status: She  is alert and oriented to person, place, and time.     Sensory: No sensory deficit.     Motor: No weakness or abnormal muscle tone.     Coordination: Coordination normal.  Psychiatric:        Mood and Affect: Mood normal.        Behavior: Behavior normal.      Imaging: No results found.

## 2023-03-27 ENCOUNTER — Other Ambulatory Visit: Payer: Self-pay

## 2023-03-27 DIAGNOSIS — E871 Hypo-osmolality and hyponatremia: Secondary | ICD-10-CM

## 2023-03-28 ENCOUNTER — Other Ambulatory Visit: Payer: Self-pay | Admitting: Physician Assistant

## 2023-03-28 DIAGNOSIS — Z1231 Encounter for screening mammogram for malignant neoplasm of breast: Secondary | ICD-10-CM

## 2023-04-04 ENCOUNTER — Other Ambulatory Visit: Payer: BC Managed Care – PPO

## 2023-04-04 DIAGNOSIS — E871 Hypo-osmolality and hyponatremia: Secondary | ICD-10-CM

## 2023-04-04 LAB — COMPREHENSIVE METABOLIC PANEL
ALT: 16 U/L (ref 0–35)
AST: 20 U/L (ref 0–37)
Albumin: 4.2 g/dL (ref 3.5–5.2)
Alkaline Phosphatase: 64 U/L (ref 39–117)
BUN: 7 mg/dL (ref 6–23)
CO2: 29 meq/L (ref 19–32)
Calcium: 9.4 mg/dL (ref 8.4–10.5)
Chloride: 96 meq/L (ref 96–112)
Creatinine, Ser: 0.81 mg/dL (ref 0.40–1.20)
GFR: 75.45 mL/min (ref >60.00)
Glucose, Bld: 99 mg/dL (ref 70–99)
Potassium: 4 meq/L (ref 3.5–5.1)
Sodium: 133 meq/L — ABNORMAL LOW (ref 135–145)
Total Bilirubin: 0.5 mg/dL (ref 0.2–1.2)
Total Protein: 7.1 g/dL (ref 6.0–8.3)

## 2023-04-04 LAB — CBC
HCT: 43.3 % (ref 36.0–46.0)
Hemoglobin: 14.6 g/dL (ref 12.0–15.0)
MCHC: 33.6 g/dL (ref 30.0–36.0)
MCV: 97.6 fl (ref 78.0–100.0)
Platelets: 291 10*3/uL (ref 150.0–400.0)
RBC: 4.44 Mil/uL (ref 3.87–5.11)
RDW: 13.1 % (ref 11.5–15.5)
WBC: 6.2 10*3/uL (ref 4.0–10.5)

## 2023-04-09 ENCOUNTER — Encounter: Payer: Self-pay | Admitting: Physician Assistant

## 2023-04-10 ENCOUNTER — Other Ambulatory Visit: Payer: Self-pay | Admitting: Physician Assistant

## 2023-04-10 DIAGNOSIS — E039 Hypothyroidism, unspecified: Secondary | ICD-10-CM

## 2023-04-11 ENCOUNTER — Encounter: Payer: Self-pay | Admitting: Physical Medicine and Rehabilitation

## 2023-04-22 DIAGNOSIS — G4733 Obstructive sleep apnea (adult) (pediatric): Secondary | ICD-10-CM | POA: Diagnosis not present

## 2023-04-24 NOTE — Telephone Encounter (Signed)
 Please see pt question/response

## 2023-05-01 ENCOUNTER — Encounter: Payer: Self-pay | Admitting: *Deleted

## 2023-05-05 ENCOUNTER — Ambulatory Visit
Admission: RE | Admit: 2023-05-05 | Discharge: 2023-05-05 | Disposition: A | Discharge status: Home / Self Care | Payer: BC Managed Care – PPO | Source: Ambulatory Visit | Attending: Physician Assistant | Admitting: Physician Assistant

## 2023-05-05 DIAGNOSIS — Z1231 Encounter for screening mammogram for malignant neoplasm of breast: Secondary | ICD-10-CM

## 2023-05-11 ENCOUNTER — Encounter: Payer: Self-pay | Admitting: Physician Assistant

## 2023-05-12 ENCOUNTER — Ambulatory Visit: Admitting: Physician Assistant

## 2023-05-16 ENCOUNTER — Encounter: Payer: Self-pay | Admitting: Physician Assistant

## 2023-05-16 ENCOUNTER — Telehealth: Admitting: Physician Assistant

## 2023-05-16 VITALS — Ht 65.75 in | Wt 222.0 lb

## 2023-05-16 DIAGNOSIS — E782 Mixed hyperlipidemia: Secondary | ICD-10-CM | POA: Diagnosis not present

## 2023-05-16 NOTE — Progress Notes (Signed)
 Virtual Visit via Video Note  I connected with  Jessica Berger  on 05/16/23 at  2:30 PM EDT by a video enabled telemedicine application and verified that I am speaking with the correct person using two identifiers.  Location: Patient: home Provider: Nature conservation officer at [employer] Persons present: Patient and myself   I discussed the limitations of evaluation and management by telemedicine and the availability of in person appointments. The patient expressed understanding and agreed to proceed.   History of Present Illness:  Discussed the use of AI scribe software for clinical note transcription with the patient, who gave verbal consent to proceed.  History of Present Illness Jessica Berger is a 68 year old female who presents for a virtual visit to discuss the need for cholesterol medication.  She has a long history of elevated cholesterol levels, although she has never been on cholesterol medication. Her total cholesterol was 224 mg/dL and LDL was 096 mg/dL six years ago. Recently, her total cholesterol increased to 268 mg/dL and LDL to 045 mg/dL.  No known cardiovascular disease or heart problems. She has not undergone any imaging studies indicating plaque buildup, nor has she had a CT scan for her heart or related issues.  Family history is significant for high cholesterol, affecting her parents. She is mindful of her diet. Tries to exercise within her ability.     Observations/Objective:   Gen: Awake, alert, no acute distress Resp: Breathing is even and non-labored Psych: calm/pleasant demeanor Neuro: Alert and Oriented x 3, + facial symmetry, speech is clear.   Assessment and Plan:   Assessment & Plan Hyperlipidemia Chronic hyperlipidemia with elevated LDL levels. Total cholesterol increased from 224 mg/dL to 409 mg/dL, and LDL from 811 mg/dL to 914 mg/dL over six years. No cardiovascular disease or heart problems. Family history of hyperlipidemia. Her  10-year cardiovascular risk score is 10%, above the 7% threshold for intervention. Discussed potential for early myocardial infarction and cerebrovascular accident due to high LDL. She prefers further risk assessment before medication. Discussed CT coronary calcium score to evaluate coronary artery plaque burden. Explained scan is self-pay, typically $99-$150, and may detect non-cardiac findings requiring follow-up.  - Order CT coronary calcium score to assess coronary artery plaque burden. - If CT score indicates high risk, consider starting statin medication. - Advise on maintaining healthy nutrition and exercise habits.    Lab Results  Component Value Date   CHOL 268 (H) 03/16/2023   HDL 99.30 03/16/2023   LDLCALC 152 (H) 03/16/2023   TRIG 84.0 03/16/2023   CHOLHDL 3 03/16/2023    The 10-year ASCVD risk score (Arnett DK, et al., 2019) is: 10%   Values used to calculate the score:     Age: 69 years     Sex: Female     Is Non-Hispanic African American: No     Diabetic: No     Tobacco smoker: No     Systolic Blood Pressure: 172 mmHg     Is BP treated: No     HDL Cholesterol: 99.3 mg/dL     Total Cholesterol: 268 mg/dL   Follow Up Instructions:    I discussed the assessment and treatment plan with the patient. The patient was provided an opportunity to ask questions and all were answered. The patient agreed with the plan and demonstrated an understanding of the instructions.   The patient was advised to call back or seek an in-person evaluation if the symptoms worsen or  if the condition fails to improve as anticipated.  Kiki Bivens M Larina Lieurance, PA-C

## 2023-05-19 ENCOUNTER — Ambulatory Visit
Admission: RE | Admit: 2023-05-19 | Discharge: 2023-05-19 | Disposition: A | Discharge status: Home / Self Care | Source: Ambulatory Visit | Attending: Physician Assistant | Admitting: Physician Assistant

## 2023-05-19 ENCOUNTER — Encounter: Payer: Self-pay | Admitting: Oncology

## 2023-05-19 DIAGNOSIS — E782 Mixed hyperlipidemia: Secondary | ICD-10-CM

## 2023-05-23 DIAGNOSIS — G4733 Obstructive sleep apnea (adult) (pediatric): Secondary | ICD-10-CM | POA: Diagnosis not present

## 2023-05-30 ENCOUNTER — Encounter: Payer: Self-pay | Admitting: Physician Assistant

## 2023-06-22 DIAGNOSIS — G4733 Obstructive sleep apnea (adult) (pediatric): Secondary | ICD-10-CM | POA: Diagnosis not present

## 2023-06-27 ENCOUNTER — Encounter: Payer: Self-pay | Admitting: Adult Health

## 2023-07-05 ENCOUNTER — Encounter: Payer: Self-pay | Admitting: Adult Health

## 2023-07-05 ENCOUNTER — Ambulatory Visit: Admitting: Adult Health

## 2023-07-05 VITALS — BP 160/90 | HR 52 | Ht 66.0 in | Wt 226.0 lb

## 2023-07-05 DIAGNOSIS — G4733 Obstructive sleep apnea (adult) (pediatric): Secondary | ICD-10-CM | POA: Diagnosis not present

## 2023-07-05 NOTE — Patient Instructions (Signed)
 Continue using CPAP nightly and greater than 4 hours each night If your symptoms worsen or you develop new symptoms please let us know.

## 2023-07-05 NOTE — Progress Notes (Signed)
 PATIENT: Jessica Berger DOB: 10/04/1955  REASON FOR VISIT: follow up HISTORY FROM: patient PRIMARY NEUROLOGIST: Ath[facility]  Chief Complaint  Patient presents with   Follow-up    Pt in 18 alone Pt here for cpap f/u Pt states has some anxiety  and at times hard for  her to tolerate mask      HISTORY OF PRESENT ILLNESS: Today 07/05/23:  Jessica Berger is a 68 y.o. female with a history of OSA on CPAP. Returns today for follow-up.  She reports that the CPAP has been working well for her.  She states that some nights she cannot keep it on for as long but she has anxiety and the mask makes her anxiety worse.  Her blood pressure is slightly elevated today.  She reports that she typically gets whitecoat syndrome.  States that she will recheck her blood pressure at home.  Download is below       REVIEW OF SYSTEMS: Out of a complete 14 system review of symptoms, the patient complains only of the following symptoms, and all other reviewed systems are negative.   ESS 5  ALLERGIES: Allergies  Allergen Reactions   Demerol [Meperidine]     migraines   Penicillins Hives   Sulfa Antibiotics Hives    HOME MEDICATIONS: Outpatient Medications Prior to Visit  Medication Sig Dispense Refill   augmented betamethasone  dipropionate (DIPROLENE -AF) 0.05 % cream Apply topically 2 (two) times daily as needed.     Cholecalciferol (VITAMIN D  PO) Take by mouth.     clonazePAM (KLONOPIN) 1 MG tablet Take by mouth.     fexofenadine (ALLEGRA) 180 MG tablet Take 180 mg by mouth daily.     lamoTRIgine (LAMICTAL) 100 MG tablet      LATUDA 80 MG TABS tablet Take 1 tablet by mouth daily.     levothyroxine  (SYNTHROID ) 112 MCG tablet TAKE 1 TABLET DAILY 90 tablet 1   meloxicam  (MOBIC ) 15 MG tablet TAKE 1 TABLET BY MOUTH EVERY DAY WITH FOOD 30 tablet 0   NEXIUM 40 MG capsule Take 40 mg by mouth daily at 12 noon.      propranolol ER (INDERAL LA) 80 MG 24 hr capsule Take 80 mg by mouth daily.     Rhubarb  (ESTROVEN MENOPAUSE RELIEF PO) Take 1 tablet by mouth daily.     vitamin B-12 (CYANOCOBALAMIN ) 1000 MCG tablet Take 1,000 mcg by mouth daily.     No facility-administered medications prior to visit.    PAST MEDICAL HISTORY: Past Medical History:  Diagnosis Date   Allergy    Anemia, unspecified 04/18/2012   Anxiety    Arthritis    Bell's palsy    Bipolar 2 disorder (HCC)    Cataract    Depression    GERD (gastroesophageal reflux disease)    Hearing loss on left    History of chickenpox    Hypercholesteremia    Hypothyroidism 02/01/2012   Ischemic colitis (HCC) 2010   Low sodium levels    Menopause    Migraine    OSA on CPAP    Sleep apnea    Ulcerative colitis (HCC) 02/01/2012    PAST SURGICAL HISTORY: Past Surgical History:  Procedure Laterality Date   CATARACT EXTRACTION W/ INTRAOCULAR LENS IMPLANT Left 03/14/2016   CATARACT EXTRACTION W/ INTRAOCULAR LENS IMPLANT Right 04/18/2016   EYE SURGERY     NASAL SEPTUM SURGERY      FAMILY HISTORY: Family History  Problem Relation Age of Onset  Heart failure Mother    Osteoarthritis Mother    Depression Mother    Hypertension Mother    Stroke Mother    Asthma Mother    Arthritis Mother    Heart disease Mother    Vision loss Mother    Varicose Veins Mother    Sleep apnea Mother    Colon cancer Father 4       Colon   Hypertension Father    Arthritis Father    Cancer Father    COPD Father    Anxiety disorder Sister    Breast cancer Maternal Grandmother    Cancer Maternal Grandmother    Cancer Maternal Grandfather        lung   Cancer Paternal Grandmother        skin cancer, nose   Heart attack Paternal Grandfather    Urticaria Neg Hx     SOCIAL HISTORY: Social History   Socioeconomic History   Marital status: Married    Spouse name: Not on file   Number of children: 1   Years of education: BA   Highest education level: Bachelor's degree (e.g., BA, AB, BS)  Occupational History   Occupation: N/A   Tobacco Use   Smoking status: Former    Current packs/day: 0.00    Average packs/day: 1 pack/day for 12.0 years (12.0 ttl pk-yrs)    Types: Cigarettes    Quit date: 04/25/1991    Years since quitting: 32.2   Smokeless tobacco: Never  Vaping Use   Vaping status: Never Used  Substance and Sexual Activity   Alcohol use: Yes    Alcohol/week: 7.0 standard drinks of alcohol    Types: 7 Shots of liquor per week   Drug use: No   Sexual activity: Yes    Partners: Male    Birth control/protection: Post-menopausal, Other-see comments    Comment: husband vasectomy  Other Topics Concern   Not on file  Social History Narrative   2 cups of coffee a day, occasional tea    Pt lives with husband    Retired    Teacher, early years/pre Strain: Low Risk  (05/13/2023)   Overall Financial Resource Strain (CARDIA)    Difficulty of Paying Living Expenses: Not hard at all  Food Insecurity: No Food Insecurity (05/13/2023)   Hunger Vital Sign    Worried About Running Out of Food in the Last Year: Never true    Ran Out of Food in the Last Year: Never true  Transportation Needs: No Transportation Needs (05/13/2023)   PRAPARE - Administrator, Civil Service (Medical): No    Lack of Transportation (Non-Medical): No  Physical Activity: Insufficiently Active (05/13/2023)   Exercise Vital Sign    Days of Exercise per Week: 3 days    Minutes of Exercise per Session: 40 min  Stress: Stress Concern Present (05/13/2023)   [employer] of Occupational Health - Occupational Stress Questionnaire    Feeling of Stress : To some extent  Social Connections: Moderately Isolated (05/13/2023)   Social Connection and Isolation Panel [NHANES]    Frequency of Communication with Friends and Family: Twice a week    Frequency of Social Gatherings with Friends and Family: Once a week    Attends Religious Services: Never    Database administrator or Organizations: No    Attends Museum/gallery exhibitions officer: Not on file    Marital Status: Married  Catering manager Violence: Not on  file      PHYSICAL EXAM  Vitals:   07/05/23 1045  BP: (!) 160/90  Pulse: (!) 52  Weight: 226 lb (102.5 kg)  Height: 5\' 6"  (1.676 m)   Body mass index is 36.48 kg/m.  Generalized: Well developed, in no acute distress  Chest: Lungs clear to auscultation bilaterally  Neurological examination  Mentation: Alert oriented to time, place, history taking. Follows all commands speech and language fluent Cranial nerve II-XII: Extraocular movements were full, visual field were full on confrontational test Head turning and shoulder shrug  were normal and symmetric. Motor: The motor testing reveals 5 over 5 strength of all 4 extremities. Good symmetric motor tone is noted throughout.  Sensory: Sensory testing is intact to soft touch on all 4 extremities. No evidence of extinction is noted.  Gait and station: Gait is normal.    DIAGNOSTIC DATA (LABS, IMAGING, TESTING) - I reviewed patient records, labs, notes, testing and imaging myself where available.  Lab Results  Component Value Date   WBC 6.2 04/04/2023   HGB 14.6 04/04/2023   HCT 43.3 04/04/2023   MCV 97.6 04/04/2023   PLT 291.0 04/04/2023      Component Value Date/Time   NA 133 (L) 04/04/2023 1346   NA 139 05/12/2014 0000   NA 138 04/18/2012 1346   K 4.0 04/04/2023 1346   K 4.0 04/18/2012 1346   CL 96 04/04/2023 1346   CL 104 04/18/2012 1346   CO2 29 04/04/2023 1346   CO2 25 04/18/2012 1346   GLUCOSE 99 04/04/2023 1346   GLUCOSE 93 04/18/2012 1346   BUN 7 04/04/2023 1346   BUN 10 05/12/2014 0000   BUN 17.2 04/18/2012 1346   CREATININE 0.81 04/04/2023 1346   CREATININE 1.0 04/18/2012 1346   CALCIUM 9.4 04/04/2023 1346   CALCIUM 9.1 04/18/2012 1346   PROT 7.1 04/04/2023 1346   PROT 6.8 04/18/2012 1346   ALBUMIN 4.2 04/04/2023 1346   ALBUMIN 3.6 04/18/2012 1346   AST 20 04/04/2023 1346   AST 15 04/18/2012 1346   ALT  16 04/04/2023 1346   ALT 12 04/18/2012 1346   ALKPHOS 64 04/04/2023 1346   ALKPHOS 78 04/18/2012 1346   BILITOT 0.5 04/04/2023 1346   BILITOT 0.38 04/18/2012 1346   Lab Results  Component Value Date   CHOL 268 (H) 03/16/2023   HDL 99.30 03/16/2023   LDLCALC 152 (H) 03/16/2023   TRIG 84.0 03/16/2023   CHOLHDL 3 03/16/2023   Lab Results  Component Value Date   HGBA1C 5.3 03/16/2023   Lab Results  Component Value Date   VITAMINB12 859 03/16/2023   Lab Results  Component Value Date   TSH 1.75 03/16/2023      ASSESSMENT AND PLAN 68 y.o. year old female  has a past medical history of Allergy, Anemia, unspecified (04/18/2012), Anxiety, Arthritis, Bell's palsy, Bipolar 2 disorder (HCC), Cataract, Depression, GERD (gastroesophageal reflux disease), Hearing loss on left, History of chickenpox, Hypercholesteremia, Hypothyroidism (02/01/2012), Ischemic colitis (HCC) (2010), Low sodium levels, Menopause, Migraine, OSA on CPAP, Sleep apnea, and Ulcerative colitis (HCC) (02/01/2012). here with:  OSA on CPAP  - CPAP compliance excellent - Good treatment of AHI  - Encourage patient to use CPAP nightly and > 4 hours each night - F/U in 1 year or sooner if needed    Clem Currier, MSN, NP-C 07/05/2023, 10:53 AM [facility] [address] [place], [place] [address] [phone]

## 2023-07-12 ENCOUNTER — Ambulatory Visit: Payer: Self-pay

## 2023-07-12 NOTE — Telephone Encounter (Signed)
  Copied From CRM [id number]. Reason for Triage: Elevated blood pressure 150/100 all week,no other symptoms

## 2023-07-12 NOTE — Telephone Encounter (Signed)
 FYI Only or Action Required?: FYI only for provider  Patient was last seen in primary care on 08/10/2020 by Rondall Codding, FNP. Called Nurse Triage reporting Hypertension. Symptoms began a week ago. Interventions attempted: Dietary changes. Symptoms are: unchanged.  Triage Disposition: See PCP When Office is Open (Within 3 Days)  Patient/caregiver understands and will follow disposition?:yes   Reason for Disposition  Systolic BP  >= 160 OR Diastolic >= 100  Answer Assessment - Initial Assessment Questions 1. BLOOD PRESSURE: What is the blood pressure? Did you take at least two measurements 5 minutes apart?     160/96, 174/104 P 58 2. ONSET: When did you take your blood pressure?     1 week- 1 hour, 4:30 3. HOW: How did you take your blood pressure? (e.g., automatic home BP monitor, visiting nurse)     Automatic- arm 4. HISTORY: Do you have a history of high blood pressure?     No- always ben perfect 5. MEDICINES: Are you taking any medicines for blood pressure? Have you missed any doses recently?     no 6. OTHER SYMPTOMS: Do you have any symptoms? (e.g., blurred vision, chest pain, difficulty breathing, headache, weakness)     no  Protocols used: Blood Pressure - High-A-AH

## 2023-07-13 ENCOUNTER — Ambulatory Visit (INDEPENDENT_AMBULATORY_CARE_PROVIDER_SITE_OTHER): Admitting: Physician Assistant

## 2023-07-13 ENCOUNTER — Encounter: Payer: Self-pay | Admitting: Oncology

## 2023-07-13 ENCOUNTER — Encounter: Payer: Self-pay | Admitting: Physician Assistant

## 2023-07-13 VITALS — BP 174/100 | HR 64 | Temp 98.3°F | Ht 66.0 in | Wt 222.2 lb

## 2023-07-13 DIAGNOSIS — I10 Essential (primary) hypertension: Secondary | ICD-10-CM

## 2023-07-13 MED ORDER — LOSARTAN POTASSIUM 25 MG PO TABS: 25.0000 mg | ORAL_TABLET | Freq: Every day | ORAL | 1 refills | Status: DC

## 2023-07-13 MED ORDER — MELOXICAM 7.5 MG PO TABS: 7.5000 mg | ORAL_TABLET | Freq: Every day | ORAL | 1 refills | Status: DC

## 2023-07-13 NOTE — Progress Notes (Signed)
 Jessica Berger is a 68 y.o. female here for a new problem.  History of Present Illness:   Chief Complaint  Patient presents with   Hypertension    Pt states went to neurology last week and her bp was elevated she had no idea that it was that high. But she has been keeping record of the bp since last week. 172/101 Sunday Monday 159/100 Wednesday 160/96 Thursday morning when first got up is 158/96 HR 64. States she is feeling fine at this time.    HPI  Elevated BP Pt complains of elevated BP.  She states she had a f/u with neurology on 6/4 who were concerned with her BP after checking it. At neurology, they then took her BP on both arms and manually and her BP was 180-190 systolic at the time. Since then, she has kept a BP log for the week of 6/8:  Sunday 172/101 Tuesday 139/100 Wednesday 160/96 Thursday 158/96  Pt reports bilateral feet swelling which has worsened since her trip to [facility], particularly on the car ride and after not drinking her electrolytes. Pt also reports that all and whole of her toes sometimes look dark in the morning. Pt takes Meloxicam  daily for back pain, Propanolol daily for anxiety, and Rhubarb daily for menopause.  Her thyroid  is well-controlled and she denies kidney disease.  She notes she has not been wearing her CPAP as much due to anxiety and  uses an adjustable bed with the ends raised. Endorses a low baseline pulse, at home BP cuff, drinking coconut water, exercise 3-4 times a week, and walks after dinner  Denies chest pain or shortness of breath. Denies high sodium consumption, daily alcohol intake for the past month, or stress. Denies ever being on an antihypertensive or taking additional Ibuprofen with Meloxicam . Pt is agreeable to trying a lower dose of Meloxicam , but does not want to start a diuretic.  Past Medical History:  Diagnosis Date   Allergy    Anemia, unspecified 04/18/2012   Anxiety    Arthritis    Bell's palsy    Bipolar 2  disorder (HCC)    Cataract    Depression    GERD (gastroesophageal reflux disease)    Hearing loss on left    History of chickenpox    Hypercholesteremia    Hypothyroidism 02/01/2012   Ischemic colitis (HCC) 2010   Low sodium levels    Menopause    Migraine    OSA on CPAP    Sleep apnea    Ulcerative colitis (HCC) 02/01/2012     Social History   Tobacco Use   Smoking status: Former    Current packs/day: 0.00    Average packs/day: 1 pack/day for 12.0 years (12.0 ttl pk-yrs)    Types: Cigarettes    Quit date: 04/25/1991    Years since quitting: 32.2   Smokeless tobacco: Never  Vaping Use   Vaping status: Never Used  Substance Use Topics   Alcohol use: Yes    Alcohol/week: 7.0 standard drinks of alcohol    Types: 7 Shots of liquor per week   Drug use: No    Past Surgical History:  Procedure Laterality Date   CATARACT EXTRACTION W/ INTRAOCULAR LENS IMPLANT Left 03/14/2016   CATARACT EXTRACTION W/ INTRAOCULAR LENS IMPLANT Right 04/18/2016   EYE SURGERY     NASAL SEPTUM SURGERY      Family History  Problem Relation Age of Onset   Heart failure Mother    Osteoarthritis  Mother    Depression Mother    Hypertension Mother    Stroke Mother    Asthma Mother    Arthritis Mother    Heart disease Mother    Vision loss Mother    Varicose Veins Mother    Sleep apnea Mother    Colon cancer Father 66       Colon   Hypertension Father    Arthritis Father    Cancer Father    COPD Father    Anxiety disorder Sister    Breast cancer Maternal Grandmother    Cancer Maternal Grandmother    Cancer Maternal Grandfather        lung   Cancer Paternal Grandmother        skin cancer, nose   Heart attack Paternal Grandfather    Urticaria Neg Hx     Allergies  Allergen Reactions   Demerol [Meperidine]     migraines   Penicillins Hives   Sulfa Antibiotics Hives    Current Medications:   Current Outpatient Medications:    augmented betamethasone  dipropionate  (DIPROLENE -AF) 0.05 % cream, Apply topically 2 (two) times daily as needed., Disp: , Rfl:    Cholecalciferol (VITAMIN D  PO), Take by mouth., Disp: , Rfl:    clonazePAM (KLONOPIN) 1 MG tablet, Take by mouth., Disp: , Rfl:    fexofenadine (ALLEGRA) 180 MG tablet, Take 180 mg by mouth daily., Disp: , Rfl:    lamoTRIgine (LAMICTAL) 100 MG tablet, , Disp: , Rfl:    LATUDA 80 MG TABS tablet, Take 1 tablet by mouth daily., Disp: , Rfl:    levothyroxine  (SYNTHROID ) 112 MCG tablet, TAKE 1 TABLET DAILY, Disp: 90 tablet, Rfl: 1   losartan (COZAAR) 25 MG tablet, Take 1 tablet (25 mg total) by mouth daily., Disp: 30 tablet, Rfl: 1   meloxicam  (MOBIC ) 7.5 MG tablet, Take 1 tablet (7.5 mg total) by mouth daily., Disp: 30 tablet, Rfl: 1   NEXIUM 40 MG capsule, Take 40 mg by mouth daily at 12 noon. , Disp: , Rfl:    propranolol ER (INDERAL LA) 80 MG 24 hr capsule, Take 80 mg by mouth daily., Disp: , Rfl:    Rhubarb (ESTROVEN MENOPAUSE RELIEF PO), Take 1 tablet by mouth daily., Disp: , Rfl:    vitamin B-12 (CYANOCOBALAMIN ) 1000 MCG tablet, Take 1,000 mcg by mouth daily., Disp: , Rfl:    Review of Systems:   Negative unless otherwise specified per HPI.  Vitals:   Vitals:   07/13/23 1115 07/13/23 1146  BP: (!) 146/94 (!) 174/100  Pulse: 64   Temp: 98.3 F (36.8 C)   TempSrc: Temporal   SpO2: 98%   Weight: 222 lb 3.2 oz (100.8 kg)   Height: 5' 6 (1.676 m)      Body mass index is 35.86 kg/m.  Physical Exam:   Physical Exam Vitals and nursing note reviewed.  Constitutional:      General: She is not in acute distress.    Appearance: She is well-developed. She is not ill-appearing or toxic-appearing.   Cardiovascular:     Rate and Rhythm: Normal rate and regular rhythm.     Pulses: Normal pulses.     Heart sounds: Normal heart sounds, S1 normal and S2 normal.  Pulmonary:     Effort: Pulmonary effort is normal.     Breath sounds: Normal breath sounds.   Skin:    General: Skin is warm and  dry.   Neurological:     Mental  Status: She is alert.     GCS: GCS eye subscore is 4. GCS verbal subscore is 5. GCS motor subscore is 6.   Psychiatric:        Speech: Speech normal.        Behavior: Behavior normal. Behavior is cooperative.     Assessment and Plan:   1. Primary hypertension (Primary) - CBC with Differential/Platelet - Comprehensive metabolic panel with GFR - TSH   Above goal today No evidence of end-organ damage on my exam Recommend patient monitor home blood pressure at least a few times weekly I recommended that we decrease her meloxicam  to 7.5 mg daily to see if we can reduce NSAID use I recommended that she continue to work on getting more compliant with her CPAP as this could be contributing to her elevated blood pressure We will start losartan 25 mg daily and update blood work today If home monitoring shows consistent elevation, or any symptom(s) develop, recommend reach out to us  for further advice on next steps I recommend follow-up with her PCP in about a month, sooner if concerns   I, Timoteo Force, acting as a Neurosurgeon for [employer], [place]., have documented all relevant documentation on the behalf of Alexander Iba, [place], as directed by  Alexander Iba, PA while in the presence of Alexander Iba, [place].  I, Alexander Iba, [place], have reviewed all documentation for this visit. The documentation on 07/13/23 for the exam, diagnosis, procedures, and orders are all accurate and complete.  Alexander Iba, PA-C

## 2023-07-13 NOTE — Patient Instructions (Signed)
 It was great to see you!  Let's switch your mobic  15 mg to 7.5 mg daily to see if we can get by with less NSAIDs  Start losartan 25 mg daily  Your blood pressure is elevated in our office today.  I recommend that you monitor this at home.  Your goal blood pressure should be around < 130/80, unless you are over 67 years old, your goal may be closer to 140-150/90. Please note if you have been given other goals from a cardiologist or other healthcare provider, please defer to their recommendations.  When preparing to take your blood pressure: Plan ahead. Don't smoke, drink caffeine or exercise within 30 minutes before taking your blood pressure. Empty your bladder. Don't take the measurement over clothes. Remove the clothing over the arm that will be used to measure blood pressure. You can use either arm unless otherwise told by a healthcare provider. Usually there is not a big difference between readings on them. Be still. Allow at least five minutes of quiet rest before measurements. Don't talk or use the phone. Sit correctly. Sit with your back straight and supported (on a dining chair, rather than a sofa). Your feet should be flat on the floor. Do not cross your legs. Support your arm on a flat surface. The middle of the cuff should be placed on the upper arm at heart level.  Measure at the same time of the day. Take multiple readings and record the results. Each time you measure, take two readings one minute apart. Record the results and bring in to your next office visit.  In order to know how well the medication is working, I would like you to take your readings 1-2 hours after taking your blood pressure medication if possible. Take your blood pressure measurements and record 2-3 days per week.  If you get a high blood pressure reading: A single high reading is not an immediate cause for alarm. If you get a reading that is higher than normal, take your blood pressure a second time. Write  down the results of both measurements. Check with your health care professional to see if there's a health concern or whether there may be problems with your monitor. If your blood pressure readings are suddenly higher than 180/120 mm Hg, wait at least one minute and test again. If your readings are still very high, contact your health care professional immediately. You could be having a hypertensive crisis. Call 911 if your blood pressure is higher than 180/120 mm Hg and if you are having new signs or symptoms that may include: Chest pain Shortness of breath Back pain Numbness Weakness Change in vision Difficulty speaking Confusion Dizziness Vomiting   Follow up in 4-6 weeks with Alyssa.  Take care,  Alexander Iba PA-C

## 2023-07-14 ENCOUNTER — Ambulatory Visit: Payer: Self-pay | Admitting: Physician Assistant

## 2023-07-14 ENCOUNTER — Encounter: Payer: Self-pay | Admitting: Physician Assistant

## 2023-07-14 LAB — CBC WITH DIFFERENTIAL/PLATELET
Absolute Lymphocytes: 1570 {cells}/uL (ref 850–3900)
Absolute Monocytes: 564 {cells}/uL (ref 200–950)
Basophils Absolute: 61 {cells}/uL (ref 0–200)
Basophils Relative: 1.3 %
Eosinophils Absolute: 99 {cells}/uL (ref 15–500)
Eosinophils Relative: 2.1 %
HCT: 43.8 % (ref 35.0–45.0)
Hemoglobin: 14.8 g/dL (ref 11.7–15.5)
MCH: 32.4 pg (ref 27.0–33.0)
MCHC: 33.8 g/dL (ref 32.0–36.0)
MCV: 95.8 fL (ref 80.0–100.0)
MPV: 10.9 fL (ref 7.5–12.5)
Monocytes Relative: 12 %
Neutro Abs: 2406 {cells}/uL (ref 1500–7800)
Neutrophils Relative %: 51.2 %
Platelets: 279 10*3/uL (ref 140–400)
RBC: 4.57 10*6/uL (ref 3.80–5.10)
RDW: 11.9 % (ref 11.0–15.0)
Total Lymphocyte: 33.4 %
WBC: 4.7 10*3/uL (ref 3.8–10.8)

## 2023-07-14 LAB — COMPREHENSIVE METABOLIC PANEL WITH GFR
AG Ratio: 2 (calc) (ref 1.0–2.5)
ALT: 13 U/L (ref 6–29)
AST: 15 U/L (ref 10–35)
Albumin: 4.3 g/dL (ref 3.6–5.1)
Alkaline phosphatase (APISO): 52 U/L (ref 37–153)
BUN: 9 mg/dL (ref 7–25)
CO2: 23 mmol/L (ref 20–32)
Calcium: 9.5 mg/dL (ref 8.6–10.4)
Chloride: 102 mmol/L (ref 98–110)
Creat: 0.91 mg/dL (ref 0.50–1.05)
Globulin: 2.2 g/dL (ref 1.9–3.7)
Glucose, Bld: 102 mg/dL — ABNORMAL HIGH (ref 65–99)
Potassium: 4.6 mmol/L (ref 3.5–5.3)
Sodium: 134 mmol/L — ABNORMAL LOW (ref 135–146)
Total Bilirubin: 0.4 mg/dL (ref 0.2–1.2)
Total Protein: 6.5 g/dL (ref 6.1–8.1)
eGFR: 69 mL/min/{1.73_m2} (ref >=60)

## 2023-07-14 LAB — TSH: TSH: 3.43 m[IU]/L (ref 0.40–4.50)

## 2023-07-19 ENCOUNTER — Encounter: Payer: Self-pay | Admitting: Physician Assistant

## 2023-07-24 DIAGNOSIS — G4733 Obstructive sleep apnea (adult) (pediatric): Secondary | ICD-10-CM | POA: Diagnosis not present

## 2023-07-26 ENCOUNTER — Encounter: Payer: Self-pay | Admitting: Physical Medicine and Rehabilitation

## 2023-07-26 ENCOUNTER — Telehealth: Payer: Self-pay

## 2023-07-26 NOTE — Telephone Encounter (Signed)
 Jessica Berger, PT called stating that she is needing a CB from [facility] or Dr. Eldonna concerning patient.  CB# [phone].  Please advise.  Thank you

## 2023-07-31 ENCOUNTER — Encounter: Payer: Self-pay | Admitting: Physical Medicine and Rehabilitation

## 2023-07-31 MED ORDER — MELOXICAM 7.5 MG PO TABS: 7.5000 mg | ORAL_TABLET | Freq: Every day | ORAL | 1 refills | Status: DC

## 2023-08-01 DIAGNOSIS — F410 Panic disorder [episodic paroxysmal anxiety] without agoraphobia: Secondary | ICD-10-CM | POA: Diagnosis not present

## 2023-08-01 DIAGNOSIS — F3181 Bipolar II disorder: Secondary | ICD-10-CM | POA: Diagnosis not present

## 2023-08-04 ENCOUNTER — Other Ambulatory Visit: Payer: Self-pay | Admitting: Physician Assistant

## 2023-08-16 ENCOUNTER — Ambulatory Visit: Admitting: Physical Medicine and Rehabilitation

## 2023-08-16 ENCOUNTER — Encounter: Payer: Self-pay | Admitting: Physical Medicine and Rehabilitation

## 2023-08-16 DIAGNOSIS — M47816 Spondylosis without myelopathy or radiculopathy, lumbar region: Secondary | ICD-10-CM

## 2023-08-16 DIAGNOSIS — M47817 Spondylosis without myelopathy or radiculopathy, lumbosacral region: Secondary | ICD-10-CM

## 2023-08-16 DIAGNOSIS — G8929 Other chronic pain: Secondary | ICD-10-CM

## 2023-08-16 DIAGNOSIS — M545 Low back pain, unspecified: Secondary | ICD-10-CM | POA: Diagnosis not present

## 2023-08-16 DIAGNOSIS — M7918 Myalgia, other site: Secondary | ICD-10-CM

## 2023-08-16 MED ORDER — MELOXICAM 15 MG PO TABS: 15.0000 mg | ORAL_TABLET | Freq: Every day | ORAL | 0 refills | Status: DC

## 2023-08-16 NOTE — Progress Notes (Signed)
 Pain Scale   Average Pain 0 Patient advising she has had lower back pain that radiates to right side, however patient advising she has been having dry needling ans massage therapy and her pain is gone        +Driver, -BT, -Dye Allergies.

## 2023-08-16 NOTE — Progress Notes (Signed)
 Jessica Berger - 68 y.o. female MRN [id number]  Date of birth: 07/26/1955  Office Visit Note: Visit Date: 08/16/2023 PCP: Kathrene Mardy CHRISTELLA, PA-C Referred by: Allwardt, Mardy CHRISTELLA, PA-C  Subjective: Chief Complaint  Patient presents with   Lower Back - Follow-up   HPI: Jessica Berger is a 68 y.o. female who comes in today for evaluation of chronic, worsening and severe right sided lower back pain. Patient is well known to us . Pain ongoing for several years, worsens with prolonged standing and bending. Reports difficulty with household tasks such as cooking and washing dishes. She describes her pain as sharp and aching sensation, currently denies pain today. Some relief of pain with home exercise regimen, rest and use of medications. She is currently taking Meloxicam  daily. She is currently undergoing physical therapy treatments with Dr. Norvel Piedra, she reports significant pain relief with dry needling and massage therapy. Lumbar MRI imaging from January shows mild to moderate facet arthropathy at L4-L5 and moderate facet arthropathy at L5-S1. No significant nerve impingement noted. No high grade spinal canal stenosis. Patient has undergone multiple lumbar injections in our office including lumbar epidural steroid injections, medial branch blocks and radiofrequency ablation. Good relief of pain with lumbar epidural steroid injections until recently, no relief of pain with right L4-L5 interlaminar epidural steroid injection on 10/24/2022. She did well with medial branch blocks, however no relief of pain with radiofrequency ablation. Patient denies focal weakness, numbness and tingling. No recent trauma or falls.   I spoke with Dr. Piedra on the phone earlier last week regarding Jessica Berger. She is concerned her issues could be inflammatory related.      Review of Systems  Musculoskeletal:  Positive for myalgias. Negative for back pain.  Neurological:  Negative for tingling, sensory  change, focal weakness and weakness.  All other systems reviewed and are negative.  Otherwise per HPI.  Assessment & Plan: Visit Diagnoses:    ICD-10-CM   1. Chronic right-sided low back pain without sciatica  M54.50 CBC with Differential/Platelet   G89.29 Sedimentation rate    C-reactive protein    HLA-B27 antigen    Rheumatoid factor    ANA    2. Spondylosis without myelopathy or radiculopathy, lumbosacral region  M47.817 CBC with Differential/Platelet    Sedimentation rate    C-reactive protein    HLA-B27 antigen    Rheumatoid factor    ANA    3. Facet arthropathy, lumbar  M47.816 CBC with Differential/Platelet    Sedimentation rate    C-reactive protein    HLA-B27 antigen    Rheumatoid factor    ANA    4. Myofascial pain syndrome  M79.18 CBC with Differential/Platelet    Sedimentation rate    C-reactive protein    HLA-B27 antigen    Rheumatoid factor    ANA       Plan: Findings:  Chronic, worsening and severe right sided lower back pain. She is pain free today, continues with formal physical therapy, dry needling, massage therapy, home exercise regimen, and use of medications. Patients clinical presentation and exam are complex, differentials include facet mediated pain and myofascial pain syndrome. She does continue to take Meloxicam  daily. We discussed treatment plan today. I placed orders for rheumatological blood panel. I also discussed medication management with her, would not recommend she continue Meloxicam  long term, however can refill while we are waiting on labs. At this point, would not recommend continuing with interventional spine procedures, she seems to get good  benefit from dry needling and massage therapy. I would like her to remain active as tolerated, we will see how her labs look and follow up as needed. No red flag symptoms noted upon exam today.     Meds & Orders:  Meds ordered this encounter  Medications   meloxicam  (MOBIC ) 15 MG tablet    Sig:  Take 1 tablet (15 mg total) by mouth daily.    Dispense:  60 tablet    Refill:  0    Orders Placed This Encounter  Procedures   CBC with Differential/Platelet   Sedimentation rate   C-reactive protein   HLA-B27 antigen   Rheumatoid factor   ANA    Follow-up: Return if symptoms worsen or fail to improve.   Procedures: No procedures performed      Clinical History: CLINICAL DATA:  Low back pain, symptoms persist with > 6 wks treatment   EXAM: MRI LUMBAR SPINE WITHOUT CONTRAST   TECHNIQUE: Multiplanar, multisequence MR imaging of the lumbar spine was performed. No intravenous contrast was administered.   COMPARISON:  MRI of the lumbar spine 02/23/2019 is not retrievable at the time of dictation. Lumbar radiographs 02/13/2019 are available.   FINDINGS: Segmentation: Standard segmentation is assumed. The inferior-most fully formed intervertebral disc is labeled L5-S1.   Alignment: Slight grade 1 retrolisthesis of L3 on L4. Otherwise, no substantial sagittal subluxation.   Vertebrae: Vertebral body heights are maintained. No evidence of acute fracture or discitis/osteomyelitis. No suspicious bone lesions   Conus medullaris and cauda equina: Conus extends to the L1 level. Conus and cauda equina appear normal.   Paraspinal and other soft tissues: Unremarkable.   Disc levels:   T12-L1: Mild disc bulge.  No significant stenosis.   L1-L2: Mild disc bulge. Mild facet arthropathy. No significant stenosis.   L2-L3: Mild disc bulging with small foraminal disc protrusions. Mild facet arthropathy. No significant stenosis.   L3-L4: Mild disc bulging with small foraminal disc protrusions. Mild facet arthropathy. No significant stenosis.   L4-L5: Mild to moderate facet arthropathy.  No significant stenosis.   L5-S1: Moderate facet arthropathy.  No significant stenosis.   IMPRESSION: 1. No significant canal or foraminal stenosis. 2. Multilevel facet arthropathy,  greatest and moderate at L5-S1.     Electronically Signed   By: Gilmore GORMAN Molt M.D.   On: 02/13/2023 06:05   She reports that she quit smoking about 32 years ago. Her smoking use included cigarettes. She has a 12 pack-year smoking history. She has never used smokeless tobacco.  Recent Labs    03/16/23 1318  HGBA1C 5.3    Objective:  VS:  HT:    WT:   BMI:     BP:   HR: bpm  TEMP: ( )  RESP:  Physical Exam Vitals and nursing note reviewed.  HENT:     Head: Normocephalic and atraumatic.     Right Ear: External ear normal.     Left Ear: External ear normal.     Nose: Nose normal.     Mouth/Throat:     Mouth: Mucous membranes are moist.  Eyes:     Extraocular Movements: Extraocular movements intact.  Cardiovascular:     Rate and Rhythm: Normal rate.     Pulses: Normal pulses.  Pulmonary:     Effort: Pulmonary effort is normal.  Abdominal:     General: Abdomen is flat. There is no distension.  Musculoskeletal:        General: Tenderness present.  Cervical back: Normal range of motion.     Comments: Patient rises from seated position to standing without difficulty. Mild pain noted with facet loading and lumbar extension. 5/5 strength noted with bilateral hip flexion, knee flexion/extension, ankle dorsiflexion/plantarflexion and EHL. No clonus noted bilaterally. No pain upon palpation of greater trochanters. No pain with internal/external rotation of bilateral hips. Sensation intact bilaterally. Myofascial tenderness noted to bilateral lumbar paraspinal regions upon palpation. Negative slump test bilaterally. Ambulates without aid, gait steady.     Skin:    General: Skin is warm and dry.     Capillary Refill: Capillary refill takes less than 2 seconds.  Neurological:     General: No focal deficit present.     Mental Status: She is alert and oriented to person, place, and time.  Psychiatric:        Mood and Affect: Mood normal.        Behavior: Behavior normal.      Ortho Exam  Imaging: No results found.  Past Medical/Family/Surgical/Social History: Medications & Allergies reviewed per EMR, new medications updated. Patient Active Problem List   Diagnosis Date Noted   Obesity 03/15/2023   Mixed hyperlipidemia 10/29/2021   Vitamin D  deficiency 10/29/2021   Bloating 09/28/2020   Gastroesophageal reflux disease 09/28/2020   Change in bowel habit 09/28/2020   Encounter for annual physical exam 09/28/2020   Diaphragmatic hernia 09/28/2020   Family history of malignant neoplasm of digestive organs 09/28/2020   Fatty liver 09/28/2020   Hiatal hernia 09/28/2020   Irritable bowel syndrome with diarrhea 09/28/2020   Periumbilical pain 09/28/2020   History of colonic polyps 09/28/2020   Seasonal and perennial allergic rhinitis 05/18/2017   Easy bruising 07/18/2016   Hypothyroidism 07/15/2016   Bipolar 2 disorder (HCC)    Ischemic colitis (HCC)    Ulcerative colitis (HCC) 02/01/2012   Past Medical History:  Diagnosis Date   Allergy    Anemia, unspecified 04/18/2012   Anxiety    Arthritis    Bell's palsy    Bipolar 2 disorder (HCC)    Cataract    Depression    GERD (gastroesophageal reflux disease)    Hearing loss on left    History of chickenpox    Hypercholesteremia    Hypothyroidism 02/01/2012   Ischemic colitis (HCC) 2010   Low sodium levels    Menopause    Migraine    OSA on CPAP    Sleep apnea    Ulcerative colitis (HCC) 02/01/2012   Family History  Problem Relation Age of Onset   Heart failure Mother    Osteoarthritis Mother    Depression Mother    Hypertension Mother    Stroke Mother    Asthma Mother    Arthritis Mother    Heart disease Mother    Vision loss Mother    Varicose Veins Mother    Sleep apnea Mother    Colon cancer Father 41       Colon   Hypertension Father    Arthritis Father    Cancer Father    COPD Father    Anxiety disorder Sister    Breast cancer Maternal Grandmother    Cancer Maternal  Grandmother    Cancer Maternal Grandfather        lung   Cancer Paternal Grandmother        skin cancer, nose   Heart attack Paternal Grandfather    Urticaria Neg Hx    Past Surgical History:  Procedure Laterality Date  CATARACT EXTRACTION W/ INTRAOCULAR LENS IMPLANT Left 03/14/2016   CATARACT EXTRACTION W/ INTRAOCULAR LENS IMPLANT Right 04/18/2016   EYE SURGERY     NASAL SEPTUM SURGERY     Social History   Occupational History   Occupation: N/A  Tobacco Use   Smoking status: Former    Current packs/day: 0.00    Average packs/day: 1 pack/day for 12.0 years (12.0 ttl pk-yrs)    Types: Cigarettes    Quit date: 04/25/1991    Years since quitting: 32.3   Smokeless tobacco: Never  Vaping Use   Vaping status: Never Used  Substance and Sexual Activity   Alcohol use: Yes    Alcohol/week: 7.0 standard drinks of alcohol    Types: 7 Shots of liquor per week   Drug use: No   Sexual activity: Yes    Partners: Male    Birth control/protection: Post-menopausal, Other-see comments    Comment: husband vasectomy

## 2023-08-17 ENCOUNTER — Ambulatory Visit: Admitting: Physician Assistant

## 2023-08-17 ENCOUNTER — Encounter: Payer: Self-pay | Admitting: Physician Assistant

## 2023-08-17 VITALS — BP 162/94 | HR 74 | Temp 97.7°F | Ht 66.0 in | Wt 221.4 lb

## 2023-08-17 DIAGNOSIS — M545 Low back pain, unspecified: Secondary | ICD-10-CM

## 2023-08-17 DIAGNOSIS — I10 Essential (primary) hypertension: Secondary | ICD-10-CM | POA: Diagnosis not present

## 2023-08-17 MED ORDER — LOSARTAN POTASSIUM 50 MG PO TABS: 50.0000 mg | ORAL_TABLET | Freq: Every day | ORAL | 0 refills | Status: DC

## 2023-08-17 NOTE — Progress Notes (Signed)
 Patient ID: Jessica Berger, female    DOB: 08/06/1955, 68 y.o.   MRN: [id number]   Assessment & Plan:  Primary hypertension  Lumbar pain  Other orders -     Losartan  Potassium; Take 1 tablet (50 mg total) by mouth daily.  Dispense: 90 tablet; Refill: 0      Assessment and Plan Assessment & Plan Hypertension Hypertension with recent average readings of 130/86 mmHg for the week and 147/90 mmHg for the month. Current medication is losartan  25 mg daily. Reports occasional dizziness, possibly related to lower blood pressure readings. Blood pressure reading in office was 162/94 mmHg. Target blood pressure is 120/80 mmHg, with some flexibility up to 130/80 mmHg for those over 65. Increasing losartan  to 50 mg is preferred over adding another medication. - Increase losartan  to 50 mg daily. - Monitor blood pressure at home and report readings in mid-August. - Send prescription to [facility] for long-term management. - Advise to limit alcohol intake to help control blood pressure.  Chronic back pain with suspected inflammatory component - follows with specialist Chronic back pain with significant inflammation. Under evaluation for potential autoimmune or inflammatory causes. Inflammatory markers, RA, and ANA tests have been ordered by a specialist. Currently managed with meloxicam , but concerned about long-term use. - Continue meloxicam  15 mg until further evaluation results are available. - Await results of inflammatory markers, RA, and ANA tests.       No follow-ups on file.    Subjective:    Chief Complaint  Patient presents with   Hypertension    Pt in office for BP check and follow up;     Hypertension   Discussed the use of AI scribe software for clinical note transcription with the patient, who gave verbal consent to proceed.  History of Present Illness Jessica Berger is a 68 year old female with hypertension who presents for blood pressure management and  follow-up on inflammatory markers.  She has ongoing inflammatory symptoms, and recent tests including inflammatory markers, RA, and ANA have been conducted, with results pending. She is currently taking meloxicam  but is concerned about its long-term use.  Her blood pressure readings have been variable, with a weekly average of 130/86 mmHg and a monthly average of 147/90 mmHg. She is on losartan  25 mg daily. She has increased her exercise intensity and maintains a low-sodium diet. She reports occasional dizziness when standing and is unsure if it is related to lower blood pressure readings.  She has thin skin on her arms, which tears easily and bleeds with minor trauma. She attributes this to aging and notes a family history of similar skin issues in her mother.  She consumes alcohol on weekends and drinks plenty of water. No use of blood thinners.     Past Medical History:  Diagnosis Date   Allergy    Anemia, unspecified 04/18/2012   Anxiety    Arthritis    Bell's palsy    Bipolar 2 disorder (HCC)    Cataract    Depression    GERD (gastroesophageal reflux disease)    Hearing loss on left    History of chickenpox    Hypercholesteremia    Hypothyroidism 02/01/2012   Ischemic colitis (HCC) 2010   Low sodium levels    Menopause    Migraine    OSA on CPAP    Sleep apnea    Ulcerative colitis (HCC) 02/01/2012    Past Surgical History:  Procedure Laterality Date  CATARACT EXTRACTION W/ INTRAOCULAR LENS IMPLANT Left 03/14/2016   CATARACT EXTRACTION W/ INTRAOCULAR LENS IMPLANT Right 04/18/2016   EYE SURGERY     NASAL SEPTUM SURGERY      Family History  Problem Relation Age of Onset   Heart failure Mother    Osteoarthritis Mother    Depression Mother    Hypertension Mother    Stroke Mother    Asthma Mother    Arthritis Mother    Heart disease Mother    Vision loss Mother    Varicose Veins Mother    Sleep apnea Mother    Colon cancer Father 74       Colon    Hypertension Father    Arthritis Father    Cancer Father    COPD Father    Anxiety disorder Sister    Breast cancer Maternal Grandmother    Cancer Maternal Grandmother    Cancer Maternal Grandfather        lung   Cancer Paternal Grandmother        skin cancer, nose   Heart attack Paternal Grandfather    Urticaria Neg Hx     Social History   Tobacco Use   Smoking status: Former    Current packs/day: 0.00    Average packs/day: 1 pack/day for 12.0 years (12.0 ttl pk-yrs)    Types: Cigarettes    Quit date: 04/25/1991    Years since quitting: 32.3   Smokeless tobacco: Never  Vaping Use   Vaping status: Never Used  Substance Use Topics   Alcohol use: Yes    Alcohol/week: 7.0 standard drinks of alcohol    Types: 7 Shots of liquor per week   Drug use: No     Allergies  Allergen Reactions   Demerol [Meperidine]     migraines   Penicillins Hives   Sulfa Antibiotics Hives    Review of Systems NEGATIVE UNLESS OTHERWISE INDICATED IN HPI      Objective:     BP (!) 162/94 (BP Location: Left Arm, Patient Position: Sitting, Cuff Size: Normal)   Pulse 74   Temp 97.7 F (36.5 C) (Temporal)   Ht 5' 6 (1.676 m)   Wt 221 lb 6.4 oz (100.4 kg)   LMP 01/31/2002 (Approximate)   SpO2 97%   BMI 35.73 kg/m   Wt Readings from Last 3 Encounters:  08/17/23 221 lb 6.4 oz (100.4 kg)  07/13/23 222 lb 3.2 oz (100.8 kg)  07/05/23 226 lb (102.5 kg)    BP Readings from Last 3 Encounters:  08/17/23 (!) 162/94  07/13/23 (!) 174/100  07/05/23 (!) 160/90     Physical Exam Vitals and nursing note reviewed.  Constitutional:      Appearance: Normal appearance. She is obese.  Eyes:     Extraocular Movements: Extraocular movements intact.     Conjunctiva/sclera: Conjunctivae normal.     Pupils: Pupils are equal, round, and reactive to light.  Cardiovascular:     Rate and Rhythm: Normal rate.  Pulmonary:     Effort: Pulmonary effort is normal.  Neurological:     General: No focal  deficit present.     Mental Status: She is alert.  Psychiatric:        Mood and Affect: Mood normal.             Rosebud Koenen M Ura Yingling, PA-C

## 2023-08-17 NOTE — Patient Instructions (Signed)
  VISIT SUMMARY: Today, we discussed your blood pressure management, ongoing inflammatory symptoms, and skin fragility. We made some adjustments to your medications and provided recommendations for further care.  YOUR PLAN: HYPERTENSION: Your blood pressure readings have been variable, and we aim to achieve a target of 120/80 to 130/80 mmHg, with some flexibility for your age group. -Increase losartan  to 50 mg daily. -Monitor your blood pressure at home and report the readings in mid-August. -Prescription for losartan  50 mg has been sent to [facility]. -Limit alcohol intake to help control your blood pressure.  CHRONIC BACK PAIN WITH SUSPECTED INFLAMMATORY COMPONENT: You have chronic back pain with significant inflammation, and we are awaiting test results to determine the cause. -Continue taking meloxicam  15 mg until we have the evaluation results. -Await results of inflammatory markers, RA, and ANA tests.                       Contains text generated by [employer].                                 Contains text generated by [employer].

## 2023-08-21 LAB — CBC WITH DIFFERENTIAL/PLATELET
Absolute Lymphocytes: 1262 {cells}/uL (ref 850–3900)
Absolute Monocytes: 523 {cells}/uL (ref 200–950)
Basophils Absolute: 53 {cells}/uL (ref 0–200)
Basophils Relative: 1.1 %
Eosinophils Absolute: 101 {cells}/uL (ref 15–500)
Eosinophils Relative: 2.1 %
HCT: 43.5 % (ref 35.0–45.0)
Hemoglobin: 14.4 g/dL (ref 11.7–15.5)
MCH: 31.4 pg (ref 27.0–33.0)
MCHC: 33.1 g/dL (ref 32.0–36.0)
MCV: 95 fL (ref 80.0–100.0)
MPV: 10.9 fL (ref 7.5–12.5)
Monocytes Relative: 10.9 %
Neutro Abs: 2861 {cells}/uL (ref 1500–7800)
Neutrophils Relative %: 59.6 %
Platelets: 240 Thousand/uL (ref 140–400)
RBC: 4.58 Million/uL (ref 3.80–5.10)
RDW: 11.6 % (ref 11.0–15.0)
Total Lymphocyte: 26.3 %
WBC: 4.8 Thousand/uL (ref 3.8–10.8)

## 2023-08-21 LAB — C-REACTIVE PROTEIN: CRP: 3.3 mg/L (ref <8.0)

## 2023-08-21 LAB — SEDIMENTATION RATE: Sed Rate: 2 mm/h (ref 0–30)

## 2023-08-21 LAB — HLA-B27 ANTIGEN: HLA-B27 Antigen: NEGATIVE

## 2023-08-21 LAB — ANA: Anti Nuclear Antibody (ANA): NEGATIVE

## 2023-08-21 LAB — RHEUMATOID FACTOR: Rheumatoid fact SerPl-aCnc: 11 [IU]/mL (ref <14)

## 2023-08-23 DIAGNOSIS — G4733 Obstructive sleep apnea (adult) (pediatric): Secondary | ICD-10-CM | POA: Diagnosis not present

## 2023-09-15 ENCOUNTER — Other Ambulatory Visit: Payer: Self-pay | Admitting: Physician Assistant

## 2023-09-15 ENCOUNTER — Encounter: Payer: Self-pay | Admitting: Physician Assistant

## 2023-09-15 MED ORDER — LOSARTAN POTASSIUM-HCTZ 50-12.5 MG PO TABS: 1.0000 | ORAL_TABLET | Freq: Every day | ORAL | 1 refills | Status: DC

## 2023-09-18 ENCOUNTER — Other Ambulatory Visit: Payer: Self-pay | Admitting: Physician Assistant

## 2023-09-18 MED ORDER — LOSARTAN POTASSIUM 100 MG PO TABS
100.0000 mg | ORAL_TABLET | Freq: Every day | ORAL | 2 refills | Status: DC
Start: 2023-09-18 — End: 2023-12-03

## 2023-09-18 NOTE — Telephone Encounter (Signed)
 Please see pt reply to previous msg and advise

## 2023-09-18 NOTE — Telephone Encounter (Signed)
 Please see patient response and advise.

## 2023-09-23 DIAGNOSIS — G4733 Obstructive sleep apnea (adult) (pediatric): Secondary | ICD-10-CM | POA: Diagnosis not present

## 2023-10-05 ENCOUNTER — Other Ambulatory Visit: Payer: Self-pay | Admitting: Physician Assistant

## 2023-10-05 DIAGNOSIS — E039 Hypothyroidism, unspecified: Secondary | ICD-10-CM

## 2023-10-16 ENCOUNTER — Ambulatory Visit: Admitting: Adult Health

## 2023-10-23 DIAGNOSIS — K5730 Diverticulosis of large intestine without perforation or abscess without bleeding: Secondary | ICD-10-CM | POA: Diagnosis not present

## 2023-10-23 DIAGNOSIS — K648 Other hemorrhoids: Secondary | ICD-10-CM | POA: Diagnosis not present

## 2023-10-23 DIAGNOSIS — G4733 Obstructive sleep apnea (adult) (pediatric): Secondary | ICD-10-CM | POA: Diagnosis not present

## 2023-10-23 DIAGNOSIS — Z800 Family history of malignant neoplasm of digestive organs: Secondary | ICD-10-CM | POA: Diagnosis not present

## 2023-10-23 DIAGNOSIS — K635 Polyp of colon: Secondary | ICD-10-CM | POA: Diagnosis not present

## 2023-10-23 DIAGNOSIS — Z1211 Encounter for screening for malignant neoplasm of colon: Secondary | ICD-10-CM | POA: Diagnosis not present

## 2023-10-23 DIAGNOSIS — Z860100 Personal history of colon polyps, unspecified: Secondary | ICD-10-CM | POA: Diagnosis not present

## 2023-10-23 LAB — HM COLONOSCOPY

## 2023-10-24 DIAGNOSIS — G4733 Obstructive sleep apnea (adult) (pediatric): Secondary | ICD-10-CM | POA: Diagnosis not present

## 2023-11-02 DIAGNOSIS — D2262 Melanocytic nevi of left upper limb, including shoulder: Secondary | ICD-10-CM | POA: Diagnosis not present

## 2023-11-02 DIAGNOSIS — D225 Melanocytic nevi of trunk: Secondary | ICD-10-CM | POA: Diagnosis not present

## 2023-11-02 DIAGNOSIS — L821 Other seborrheic keratosis: Secondary | ICD-10-CM | POA: Diagnosis not present

## 2023-11-22 DIAGNOSIS — G4733 Obstructive sleep apnea (adult) (pediatric): Secondary | ICD-10-CM | POA: Diagnosis not present

## 2023-11-26 ENCOUNTER — Other Ambulatory Visit: Payer: Self-pay | Admitting: Physician Assistant

## 2023-11-30 ENCOUNTER — Encounter: Payer: Self-pay | Admitting: Physician Assistant

## 2023-12-03 ENCOUNTER — Other Ambulatory Visit: Payer: Self-pay | Admitting: Physician Assistant

## 2023-12-03 MED ORDER — LOSARTAN POTASSIUM-HCTZ 100-12.5 MG PO TABS: 1.0000 | ORAL_TABLET | Freq: Every day | ORAL | 2 refills | Status: DC

## 2023-12-04 ENCOUNTER — Encounter: Payer: Self-pay | Admitting: Radiology

## 2023-12-05 ENCOUNTER — Other Ambulatory Visit: Payer: Self-pay | Admitting: Physician Assistant

## 2023-12-05 MED ORDER — AMLODIPINE BESYLATE 5 MG PO TABS: 5.0000 mg | ORAL_TABLET | Freq: Every day | ORAL | 0 refills | Status: DC

## 2023-12-07 NOTE — Telephone Encounter (Signed)
 Pt is already scheduled

## 2023-12-20 ENCOUNTER — Other Ambulatory Visit: Payer: Self-pay | Admitting: Physician Assistant

## 2023-12-21 ENCOUNTER — Other Ambulatory Visit: Payer: Self-pay

## 2023-12-23 DIAGNOSIS — G4733 Obstructive sleep apnea (adult) (pediatric): Secondary | ICD-10-CM | POA: Diagnosis not present

## 2023-12-30 ENCOUNTER — Other Ambulatory Visit: Payer: Self-pay | Admitting: Physician Assistant

## 2024-01-02 ENCOUNTER — Ambulatory Visit: Admitting: Physician Assistant

## 2024-01-02 ENCOUNTER — Encounter: Payer: Self-pay | Admitting: Physician Assistant

## 2024-01-02 VITALS — BP 148/84 | HR 64 | Temp 97.3°F | Ht 66.0 in | Wt 232.4 lb

## 2024-01-02 DIAGNOSIS — L719 Rosacea, unspecified: Secondary | ICD-10-CM | POA: Diagnosis not present

## 2024-01-02 DIAGNOSIS — I10 Essential (primary) hypertension: Secondary | ICD-10-CM | POA: Diagnosis not present

## 2024-01-02 DIAGNOSIS — Z23 Encounter for immunization: Secondary | ICD-10-CM | POA: Diagnosis not present

## 2024-01-02 DIAGNOSIS — M545 Low back pain, unspecified: Secondary | ICD-10-CM | POA: Diagnosis not present

## 2024-01-02 MED ORDER — LOSARTAN POTASSIUM 100 MG PO TABS: 100.0000 mg | ORAL_TABLET | Freq: Every day | ORAL | 1 refills | Status: AC

## 2024-01-02 MED ORDER — AMLODIPINE BESYLATE 5 MG PO TABS: 5.0000 mg | ORAL_TABLET | Freq: Every day | ORAL | 1 refills | Status: AC

## 2024-01-02 NOTE — Progress Notes (Unsigned)
 Patient ID: Jessica Berger, female    DOB: 1956-01-06, 68 y.o.   MRN: [id number]   Assessment & Plan:  Primary hypertension  Lumbar pain  Rosacea  Immunization due -     Flu vaccine HIGH DOSE PF(Fluzone Trivalent)  Other orders -     amLODIPine  Besylate; Take 1 tablet (5 mg total) by mouth daily.  Dispense: 90 tablet; Refill: 1 -     Losartan  Potassium; Take 1 tablet (100 mg total) by mouth daily.  Dispense: 90 tablet; Refill: 1    Assessment & Plan Essential hypertension Blood pressure readings have improved with losartan  100 mg and amlodipine  5 mg. Recent readings include 133/78 mmHg and 144/86 mmHg. Home readings are generally good, though there is a discrepancy with the arm cuff reading. Weight gain is a contributing factor. - Continue losartan  100 mg daily. - Continue amlodipine  5 mg daily. - Rechecked blood pressure before leaving the clinic. - Sent amlodipine  prescription through [facility]. - Encouraged weight management, decreasing alcohol, and increased physical activity.  Chronic low back pain with lumbar spondylosis Chronic low back pain primarily on the right side, with inflammation and trigger points. Previous treatments include meloxicam , ablation, and epidural injections, with limited success. Massage therapy provides some relief. Pain does not radiate to the legs. She is hesitant to use stronger pain medications due to current medication burden. - Continue massage therapy and physical therapy. - Consider referral to [facility] and sc[facility] specialists for a second opinion if needed. - Discussed potential for stronger pain management options if pain becomes unmanageable.  Rosacea Flares with alcohol consumption, particularly wine. - Consider reducing alcohol intake to manage rosacea symptoms.     F/up as scheduled in February     Subjective:    Chief Complaint  Patient presents with   Medical Management of Chronic Issues    Pt in office for  follow up; last OV in July, advised to f/u in 4 weeks for hypertension; pt has been monitoring BP at home and back within normal range per patient;     HPI Discussed the use of AI scribe software for clinical note transcription with the patient, who gave verbal consent to proceed.  History of Present Illness Jessica Berger is a 68 year old female with hypertension who presents for a follow-up visit.  She manages her hypertension with amlodipine  5 mg and losartan  100 mg. Her blood pressure today was 142/92 mmHg, with a recent home reading of 133/78 mmHg, showing improvement compared to earlier this year.  She experiences chronic lumbar pain on her right side without radiation to her legs, persisting since at least July. She has tried treatments such as ablation and epidural injections, which initially provided relief but have become less effective. She currently finds relief through bi-weekly massage therapy, although her therapist will be unavailable during the holidays. She uses meloxicam  as needed but limits its use due to its impact on her blood pressure. The patient recalls having an MRI in January and was told that nothing seemed to be pinching off, but she has bad arthritis and inflammation. She has seen an orthopedic specialist and received injections but has not had recent follow-up. She manages her pain by alternating activity with rest, as prolonged standing exacerbates her symptoms.  She is a former smoker and consumes one to two alcoholic drinks per day, typically beer or mixed drinks. She occasionally abstains from alcohol for a month to observe any changes in her health. She  has a history of rosacea, which can be exacerbated by alcohol consumption.     Past Medical History:  Diagnosis Date   Allergy    Anemia, unspecified 04/18/2012   Anxiety    Arthritis    Bell's palsy    Bipolar 2 disorder (HCC)    Cataract    Depression    GERD (gastroesophageal reflux disease)     Hearing loss on left    History of chickenpox    Hypercholesteremia    Hypothyroidism 02/01/2012   Ischemic colitis 2010   Low sodium levels    Menopause    Migraine    OSA on CPAP    Sleep apnea    Ulcerative colitis (HCC) 02/01/2012    Past Surgical History:  Procedure Laterality Date   CATARACT EXTRACTION W/ INTRAOCULAR LENS IMPLANT Left 03/14/2016   CATARACT EXTRACTION W/ INTRAOCULAR LENS IMPLANT Right 04/18/2016   EYE SURGERY     NASAL SEPTUM SURGERY      Family History  Problem Relation Age of Onset   Heart failure Mother    Osteoarthritis Mother    Depression Mother    Hypertension Mother    Stroke Mother    Asthma Mother    Arthritis Mother    Heart disease Mother    Vision loss Mother    Varicose Veins Mother    Sleep apnea Mother    Colon cancer Father 27       Colon   Hypertension Father    Arthritis Father    Cancer Father    COPD Father    Anxiety disorder Sister    Breast cancer Maternal Grandmother    Cancer Maternal Grandmother    Cancer Maternal Grandfather        lung   Cancer Paternal Grandmother        skin cancer, nose   Heart attack Paternal Grandfather    Urticaria Neg Hx     Social History   Tobacco Use   Smoking status: Former    Current packs/day: 0.00    Average packs/day: 1 pack/day for 12.0 years (12.0 ttl pk-yrs)    Types: Cigarettes    Quit date: 04/25/1991    Years since quitting: 32.7   Smokeless tobacco: Never  Vaping Use   Vaping status: Never Used  Substance Use Topics   Alcohol use: Yes    Alcohol/week: 7.0 standard drinks of alcohol    Types: 7 Shots of liquor per week   Drug use: No     Allergies  Allergen Reactions   Demerol [Meperidine]     migraines   Penicillins Hives   Sulfa Antibiotics Hives    Review of Systems NEGATIVE UNLESS OTHERWISE INDICATED IN HPI      Objective:     BP (!) 148/84 (BP Location: Right Arm, Patient Position: Sitting, Cuff Size: Normal)   Pulse 64   Temp (!) 97.3 F  (36.3 C) (Temporal)   Ht 5' 6 (1.676 m)   Wt 232 lb 6.4 oz (105.4 kg)   LMP 01/31/2002 (Approximate)   SpO2 98%   BMI 37.51 kg/m   Wt Readings from Last 3 Encounters:  01/02/24 232 lb 6.4 oz (105.4 kg)  08/17/23 221 lb 6.4 oz (100.4 kg)  07/13/23 222 lb 3.2 oz (100.8 kg)    BP Readings from Last 3 Encounters:  01/02/24 (!) 148/84  08/17/23 (!) 162/94  07/13/23 (!) 174/100     Physical Exam Vitals and nursing note reviewed.  Constitutional:  Appearance: Normal appearance. She is obese.  Eyes:     Extraocular Movements: Extraocular movements intact.     Conjunctiva/sclera: Conjunctivae normal.     Pupils: Pupils are equal, round, and reactive to light.  Cardiovascular:     Rate and Rhythm: Normal rate.  Pulmonary:     Effort: Pulmonary effort is normal.  Skin:    Findings: Erythema (facial rosacea noted) present.  Neurological:     General: No focal deficit present.     Mental Status: She is alert.  Psychiatric:        Mood and Affect: Mood normal.             Angeni Chaudhuri M Adoni Greenough, PA-C

## 2024-01-02 NOTE — Patient Instructions (Signed)
 Look into [facility] for your back pain

## 2024-01-03 NOTE — Telephone Encounter (Signed)
 Please see pt msg regarding refills sent to pharmacy

## 2024-01-12 ENCOUNTER — Other Ambulatory Visit: Payer: Self-pay

## 2024-01-12 ENCOUNTER — Encounter: Payer: Self-pay | Admitting: Physician Assistant

## 2024-01-12 DIAGNOSIS — M545 Low back pain, unspecified: Secondary | ICD-10-CM

## 2024-01-23 DIAGNOSIS — G4733 Obstructive sleep apnea (adult) (pediatric): Secondary | ICD-10-CM | POA: Diagnosis not present

## 2024-03-19 ENCOUNTER — Encounter: Payer: BC Managed Care – PPO | Admitting: Physician Assistant

## 2024-07-04 ENCOUNTER — Telehealth: Admitting: Adult Health
